# Patient Record
Sex: Male | Born: 1955
Health system: Southern US, Community
[De-identification: ages and names within clinical notes are randomized; demographics above are authoritative.]

## PROBLEM LIST (undated history)

## (undated) DIAGNOSIS — I4891 Unspecified atrial fibrillation: Secondary | ICD-10-CM

## (undated) DIAGNOSIS — I9789 Other postprocedural complications and disorders of the circulatory system, not elsewhere classified: Secondary | ICD-10-CM

## (undated) DIAGNOSIS — Z955 Presence of coronary angioplasty implant and graft: Secondary | ICD-10-CM

## (undated) DIAGNOSIS — Z95828 Presence of other vascular implants and grafts: Secondary | ICD-10-CM

## (undated) DIAGNOSIS — I10 Essential (primary) hypertension: Secondary | ICD-10-CM

## (undated) DIAGNOSIS — I2510 Atherosclerotic heart disease of native coronary artery without angina pectoris: Secondary | ICD-10-CM

## (undated) DIAGNOSIS — G4733 Obstructive sleep apnea (adult) (pediatric): Secondary | ICD-10-CM

## (undated) DIAGNOSIS — E785 Hyperlipidemia, unspecified: Secondary | ICD-10-CM

## (undated) DIAGNOSIS — E119 Type 2 diabetes mellitus without complications: Secondary | ICD-10-CM

## (undated) HISTORY — DX: Other postprocedural complications and disorders of the circulatory system, not elsewhere classified: I97.89

## (undated) HISTORY — DX: Type 2 diabetes mellitus without complications: E11.9

## (undated) HISTORY — DX: Presence of other vascular implants and grafts: Z95.828

## (undated) HISTORY — DX: Unspecified atrial fibrillation: I48.91

## (undated) HISTORY — DX: Presence of coronary angioplasty implant and graft: Z95.5

## (undated) HISTORY — DX: Essential (primary) hypertension: I10

## (undated) HISTORY — DX: Hyperlipidemia, unspecified: E78.5

## (undated) HISTORY — DX: Atherosclerotic heart disease of native coronary artery without angina pectoris: I25.10

---

## 2000-07-06 ENCOUNTER — Encounter: Payer: Self-pay | Admitting: Neurosurgery

## 2000-07-06 ENCOUNTER — Inpatient Hospital Stay (HOSPITAL_COMMUNITY): Admission: RE | Admit: 2000-07-06 | Discharge: 2000-07-07 | Payer: Self-pay | Admitting: Neurosurgery

## 2002-08-06 ENCOUNTER — Inpatient Hospital Stay (HOSPITAL_COMMUNITY): Admission: AD | Admit: 2002-08-06 | Discharge: 2002-08-08 | Payer: Self-pay | Admitting: Cardiology

## 2002-08-06 ENCOUNTER — Encounter: Payer: Self-pay | Admitting: *Deleted

## 2002-08-07 ENCOUNTER — Encounter: Payer: Self-pay | Admitting: Cardiology

## 2004-04-02 ENCOUNTER — Encounter: Payer: Self-pay | Admitting: Cardiology

## 2004-05-04 ENCOUNTER — Inpatient Hospital Stay (HOSPITAL_COMMUNITY): Admission: AD | Admit: 2004-05-04 | Discharge: 2004-05-09 | Payer: Self-pay | Admitting: Neurosurgery

## 2004-07-29 ENCOUNTER — Observation Stay (HOSPITAL_COMMUNITY): Admission: RE | Admit: 2004-07-29 | Discharge: 2004-07-30 | Payer: Self-pay | Admitting: Neurosurgery

## 2004-12-30 ENCOUNTER — Ambulatory Visit: Admission: RE | Admit: 2004-12-30 | Discharge: 2004-12-30 | Payer: Self-pay | Admitting: *Deleted

## 2005-01-15 ENCOUNTER — Ambulatory Visit (HOSPITAL_COMMUNITY): Admission: RE | Admit: 2005-01-15 | Discharge: 2005-01-15 | Payer: Self-pay | Admitting: *Deleted

## 2005-06-16 ENCOUNTER — Ambulatory Visit: Payer: Self-pay | Admitting: Cardiology

## 2005-06-17 ENCOUNTER — Ambulatory Visit: Payer: Self-pay | Admitting: Cardiology

## 2006-07-12 ENCOUNTER — Ambulatory Visit: Payer: Self-pay | Admitting: Cardiology

## 2006-09-06 ENCOUNTER — Encounter: Payer: Self-pay | Admitting: Cardiology

## 2006-09-26 ENCOUNTER — Encounter: Payer: Self-pay | Admitting: Cardiology

## 2007-01-12 ENCOUNTER — Ambulatory Visit: Payer: Self-pay | Admitting: Cardiology

## 2008-05-15 ENCOUNTER — Ambulatory Visit: Payer: Self-pay | Admitting: Cardiology

## 2008-06-18 ENCOUNTER — Encounter: Payer: Self-pay | Admitting: Cardiology

## 2010-01-21 ENCOUNTER — Encounter: Payer: Self-pay | Admitting: Cardiology

## 2010-01-22 ENCOUNTER — Ambulatory Visit: Payer: Self-pay | Admitting: Cardiology

## 2010-01-22 DIAGNOSIS — E785 Hyperlipidemia, unspecified: Secondary | ICD-10-CM | POA: Insufficient documentation

## 2010-01-22 DIAGNOSIS — E119 Type 2 diabetes mellitus without complications: Secondary | ICD-10-CM

## 2010-01-22 DIAGNOSIS — I10 Essential (primary) hypertension: Secondary | ICD-10-CM | POA: Insufficient documentation

## 2010-01-22 DIAGNOSIS — I2510 Atherosclerotic heart disease of native coronary artery without angina pectoris: Secondary | ICD-10-CM | POA: Insufficient documentation

## 2010-01-22 DIAGNOSIS — I739 Peripheral vascular disease, unspecified: Secondary | ICD-10-CM | POA: Insufficient documentation

## 2010-01-22 DIAGNOSIS — E1151 Type 2 diabetes mellitus with diabetic peripheral angiopathy without gangrene: Secondary | ICD-10-CM | POA: Insufficient documentation

## 2010-02-05 ENCOUNTER — Ambulatory Visit: Payer: Self-pay | Admitting: Cardiology

## 2010-02-05 ENCOUNTER — Encounter: Payer: Self-pay | Admitting: Cardiology

## 2010-02-17 ENCOUNTER — Telehealth (INDEPENDENT_AMBULATORY_CARE_PROVIDER_SITE_OTHER): Payer: Self-pay | Admitting: *Deleted

## 2010-02-23 ENCOUNTER — Encounter: Payer: Self-pay | Admitting: Cardiology

## 2010-02-27 ENCOUNTER — Telehealth (INDEPENDENT_AMBULATORY_CARE_PROVIDER_SITE_OTHER): Payer: Self-pay | Admitting: *Deleted

## 2010-03-05 ENCOUNTER — Encounter: Payer: Self-pay | Admitting: Cardiology

## 2010-04-09 ENCOUNTER — Telehealth (INDEPENDENT_AMBULATORY_CARE_PROVIDER_SITE_OTHER): Payer: Self-pay | Admitting: *Deleted

## 2010-04-17 ENCOUNTER — Encounter (INDEPENDENT_AMBULATORY_CARE_PROVIDER_SITE_OTHER): Payer: Self-pay | Admitting: *Deleted

## 2010-09-07 ENCOUNTER — Encounter: Payer: Self-pay | Admitting: Cardiology

## 2010-09-07 ENCOUNTER — Telehealth (INDEPENDENT_AMBULATORY_CARE_PROVIDER_SITE_OTHER): Payer: Self-pay | Admitting: *Deleted

## 2010-09-07 ENCOUNTER — Inpatient Hospital Stay (HOSPITAL_COMMUNITY): Admission: EM | Admit: 2010-09-07 | Discharge: 2010-09-08 | Payer: Self-pay | Admitting: Emergency Medicine

## 2010-09-07 ENCOUNTER — Ambulatory Visit: Payer: Self-pay | Admitting: Internal Medicine

## 2010-09-08 ENCOUNTER — Encounter: Payer: Self-pay | Admitting: Cardiology

## 2010-12-22 NOTE — Progress Notes (Signed)
Summary: Pending ABI's  Phone Note Outgoing Call Call back at [facility] Phone [phone]   Call placed by: Cyril Loosen, RN, BSN,  February 27, 2010 4:10 PM Call placed to: Patient Summary of Call: Left message to call back on machine regarding ABI's that were scheduled for April 7th. Initial call taken by: Cyril Loosen, RN, BSN,  February 27, 2010 4:11 PM  Follow-up for Phone Call        Left message to call back on machine regarding ABI's that were ordered for 4/7 but do not appear to have been done. Cyril Loosen, RN, BSN  March 03, 2010 10:38 AM     Additional Follow-up for Phone Call Additional follow up Details #2::    I have called myself and left message with patient. Also, mailed updated order to patient' s home for the order of 02/26/2010  Follow-up by: Zachary George,  March 04, 2010 10:18 AM

## 2010-12-22 NOTE — Miscellaneous (Signed)
Summary: Orders Update  Clinical Lists Changes  Orders: Added new Referral order of Arterial Duplex Lower Extremity/ABI (Arterial Dup Lower E) - Signed 

## 2010-12-22 NOTE — Progress Notes (Signed)
Summary: Chest pain  Phone Note Call from Patient Call back at Home Phone [phone]   Summary of Call: Pt states had been doing good until this past week. He states he started having the worst heartburn of his life. He states at first antacids would help some but now he only gets relief from NTG (x1). He states this is how he felt when he had his stent placed previously. Pt states last NTG was last night. No pain currently. However, he took NTG on 3 separate occasions this weekend. Pt wants to be seen in the office today.  Discussed with Dr. Antoine Poche who recommends pt go to ER now for evaluation. Pt notified and verbalized understanding. He will plan to go to [facility] for evaluation. Initial call taken by: Cyril Loosen, RN, BSN,  September 07, 2010 9:22 AM

## 2010-12-22 NOTE — Progress Notes (Signed)
Summary: need dopplers rescheduled  Phone Note Outgoing Call Call back at [facility] Phone [phone]   Call placed by: Carlye Grippe,  February 17, 2010 8:43 AM Call placed to: Patient Summary of Call: Called and spoke with wife and she said that the hospital didn't do the dopplers and they dont know why. This need to be rescheduled. Patient awaiting call from scheduler.   RESCHEDULED DOPPERS FOR THURSDAY, APRIL7 @ 9:00AM [facility]. WILL NOTIFY PATIENT. Initial call taken by: Carlye Grippe,  February 17, 2010 8:44 AM

## 2010-12-22 NOTE — Letter (Signed)
Summary: Engineer, materials at [facility]  [address]   [place], [place] [address]   Phone: [phone]  Fax: [phone]        Apr 17, 2010 MRN: [id number]   [facility] [address] [place], [place]  [address]   Dear Mr. Noguera,  Your test ordered by Selena Batten has been reviewed by your physician Dr. Antoine Poche, and was found to have very mild decrease in the blood flow of your right leg. You may be referred to Dr. Excell Seltzer in [place] [facility] specialist if you want to be evaluated for this problem.  ____ Cardiac Stress Test  ____ Lab Work  __X__ Peripheral vascular study of arms, legs or neck  ____ CT scan or X-ray  ____ Lung or Breathing test  ____ Other:   Thank you.   Carlye Grippe LPN    J. Hochrein, M.D., F.A.C.C.

## 2010-12-22 NOTE — Progress Notes (Signed)
Summary: DOPPLER RESULTS  Phone Note Outgoing Call Call back at [phone]   Call placed by: Carlye Grippe,  Apr 09, 2010 12:25 PM Call placed to: Patient Summary of Call: called and left message on voicemail r/e the okay to leave message on his voicemail or with wife the results of his vascular study. Initial call taken by: Carlye Grippe,  Apr 09, 2010 12:26 PM  Follow-up for Phone Call        after several attempt to reach patient by phone with no success, letter mailed to patient with results.  Follow-up by: Carlye Grippe,  Apr 17, 2010 1:35 PM

## 2010-12-22 NOTE — Assessment & Plan Note (Signed)
Summary: PER DR. HOWARD/ SAW PAT IN OFFICE 01/21/2010   Visit Type:  Follow-up Primary Provider:  Dr. Selinda Flavin  CC:  CAD.  History of Present Illness: The patient presents for evaluation of known coronary disease. He has not been seen in this office in 2 years. At the last visit he was to have a stress perfusion study but did not have this done. He has not been compliant with secondary risk reduction. He ran out of statins. He doesn't watch his diet. He doesn't exercise. His blood sugars have been out of control with by his report a hemoglobin A1c greater than 12. He's been getting some chest discomfort. This is sporadic. It is a burning discomfort. It is not like the pain he had in 2003 at the time of his heart attack. However, he has taken nitroglycerin a couple of times in the last year with the last episode about 6 months ago. He is not particularly active. He doesn't think however that he can bring on the symptoms with activity. He will get short of breath walking 50 yards. He does not describe resting shortness of breath, PND or orthopnea. Of note when he does get discomfort it goes away spontaneously for the most part. He does not describe associated symptoms such as nausea vomiting or diaphoresis. He does not describe radiation to his neck or arm.  Anticoagulation Management History:      Negative risk factors for bleeding include an age less than 40 years old.  The bleeding index is 'low risk'.  Positive CHADS2 values include History of HTN.  Negative CHADS2 values include Age > 42 years old.     Preventive Screening-Counseling & Management  Alcohol-Tobacco     Smoking Status: quit > 6 months     Year Quit: 2003  Comments: started smoking at 55 yrs old  Current Medications (verified): 1)  Lipitor 10 Mg Tabs (Atorvastatin Calcium) .... Take 1 Tablet By Mouth Once A Day 2)  Lisinopril-Hydrochlorothiazide 10-12.5 Mg Tabs (Lisinopril-Hydrochlorothiazide) .... Take 1 Tablet By Mouth  Once A Day 3)  Metoprolol Succinate 25 Mg Xr24h-Tab (Metoprolol Succinate) .... Take 1 Tablet By Mouth Once  A Day 4)  Fish Oil 1000 Mg Caps (Omega-3 Fatty Acids) .... Take 1 Capsule By Mouth Once A Day 5)  Aspirin 81 Mg Tbec (Aspirin) .... Take One Tablet By Mouth Daily 6)  Metformin Hcl 500 Mg Tabs (Metformin Hcl) .... Take 2 Tablets By Mouth Every Morning  Allergies (verified): No Known Drug Allergies  Comments:  Nurse/Medical Assistant: The patient's medications were reviewed with the patient and were updated in the Medication List. Pt verbally confirmed medications.  Cyril Loosen, RN, BSN (January 22, 2010 1:21 PM)  Past History:  Past Medical History: 1. Single-vessel coronary artery disease.     a.     Status post bare-metal stenting of distal right coronary      artery, September 2003.     b.     Nonischemic Cardiolite in May 2005.     c.     No-show for Cardiolite in 2009. 2. Peripheral vascular disease.     a.     Status post stenting of the left femoral system by Dr. Liliane Bade. 2006     b.     Residual right distal peripheral vascular disease with      minimal claudication. 3. Severe sleep apnea, using CPAP. 4. Morbid obesity. 5. Dyslipidemia. 6. Diabetes 6. Hypertension.  Past Surgical History: Back lumbar  Social History: Smoking Status:  quit > 6 months  Review of Systems       Legs cramps nocturnal, hand tingling.  Otherwise as stated in the history of present illness is negative for all other systems.  Vital Signs:  Patient profile:   55 year old male Height:      68 inches Weight:      286.75 pounds BMI:     43.76 Pulse rate:   76 / minute BP sitting:   127 / 81  (left arm) Cuff size:   large  Vitals Entered By: Cyril Loosen, RN, BSN (January 22, 2010 1:14 PM)  Nutrition Counseling: Patient's BMI is greater than 25 and therefore counseled on weight management options. CC: CAD Comments Pt has stent back '03 and no recent follow  up   Physical Exam  General:  Well developed, well nourished, in no acute distress. Head:  normocephalic and atraumatic Eyes:  PERRLA/EOM intact; conjunctiva and lids normal. Mouth:  Teeth, gums and palate normal. Oral mucosa normal. Neck:  Neck supple, no JVD. No masses, thyromegaly or abnormal cervical nodes. Chest Wall:  no deformities or breast masses noted Lungs:  Clear bilaterally to auscultation and percussion. Abdomen:  Bowel sounds positive; abdomen soft and non-tender without masses, organomegaly, or hernias noted. No hepatosplenomegaly, obese Msk:  Back normal, normal gait. Muscle strength and tone normal. Extremities:  No clubbing or cyanosis. Neurologic:  Alert and oriented x 3. Skin:  Intact without lesions or rashes. Cervical Nodes:  no significant adenopathy Axillary Nodes:  no significant adenopathy Inguinal Nodes:  no significant adenopathy Psych:  Normal affect.   Detailed Cardiovascular Exam  Neck    Carotids: Carotids full and equal bilaterally without bruits.      Neck Veins: Normal, no JVD.    Heart    Inspection: no deformities or lifts noted.      Palpation: normal PMI with no thrills palpable.      Auscultation: regular rate and rhythm, S1, S2 without murmurs, rubs, gallops, or clicks.    Vascular    Abdominal Aorta: no palpable masses, pulsations, or audible bruits.      Femoral Pulses: normal femoral pulses bilaterally.      Pedal Pulses: absent right dorsalis pedis pulse, absent right posterior tibial pulse, diminished left dorsalis pedis pulse, and diminished left posterior tibial pulse.      Radial Pulses: normal radial pulses bilaterally.      Peripheral Circulation: no clubbing, cyanosis, or edema noted with normal capillary refill.     EKG  Procedure date:  01/21/2010  Findings:      normal sinus rhythm, axis within normal limits, intervals within normal limits, no acute ST-T wave changes. Done at his primary care office and reviewed  today.  Impression & Recommendations:  Problem # 1:  CORONARY ATHEROSCLEROSIS NATIVE CORONARY ARTERY (ICD-414.01) The patient has recurrent chest pain as described and known coronary disease. Given this stress perfusion testing is indicated. He needs aggressive risk reduction. Orders: Nuclear Med (Nuc Med)  Problem # 2:  ESSENTIAL HYPERTENSION, BENIGN (ICD-401.1) His blood pressure is controlled and he will continue the meds as listed.  Problem # 3:  PERIPHERAL VASCULAR DISEASE (ICD-443.9) He does have some right leg pain and reduced pulses. He status post revascularization on the left leg. I will obtain ABIs.  Problem # 4:  OBESITY, MORBID (ICD-278.01) I spent the majority of this evaluation discussing with him therapeutic lifestyle changes  and specific details on exercise and weight loss as this is a very significant problem for him.  Other Orders: Arterial Duplex Lower Extremity/ABI (Arterial Dup Lower E)  Anticoagulation Management Assessment/Plan:            Patient Instructions: 1)  Exercise Cardiolite 2)  ABI's 3)  Follow up in  6 months  Appended Document: Orders Update     Allergies: No Known Drug Allergies

## 2010-12-22 NOTE — Cardiovascular Report (Signed)
Summary: Cardiac Catheterization  Cardiac Catheterization   Imported By: Dorise Hiss 01/21/2010 16:14:45  _____________________________________________________________________  External Attachment:    Type:   Image     Comment:   External Document

## 2011-02-03 LAB — CARDIAC PANEL(CRET KIN+CKTOT+MB+TROPI)
CK, MB: 1.5 ng/mL (ref 0.3–4.0)
Total CK: 57 U/L (ref 7–232)

## 2011-02-03 LAB — HEMOGLOBIN A1C: Mean Plasma Glucose: 163 mg/dL — ABNORMAL HIGH (ref <117)

## 2011-02-03 LAB — LIPID PANEL
Cholesterol: 132 mg/dL (ref 0–200)
LDL Cholesterol: 55 mg/dL (ref 0–99)
Triglycerides: 235 mg/dL — ABNORMAL HIGH (ref <150)

## 2011-02-03 LAB — CBC
HCT: 41.1 % (ref 39.0–52.0)
HCT: 43.5 % (ref 39.0–52.0)
MCH: 33.3 pg (ref 26.0–34.0)
MCH: 33.4 pg (ref 26.0–34.0)
MCV: 93.1 fL (ref 78.0–100.0)
MCV: 95.1 fL (ref 78.0–100.0)
Platelets: 136 10*3/uL — ABNORMAL LOW (ref 150–400)
Platelets: 153 10*3/uL (ref 150–400)
RBC: 4.67 MIL/uL (ref 4.22–5.81)
RDW: 12.3 % (ref 11.5–15.5)
WBC: 6 10*3/uL (ref 4.0–10.5)

## 2011-02-03 LAB — CK TOTAL AND CKMB (NOT AT ARMC)
CK, MB: 1.7 ng/mL (ref 0.3–4.0)
Relative Index: INVALID (ref 0.0–2.5)
Total CK: 58 U/L (ref 7–232)

## 2011-02-03 LAB — GLUCOSE, CAPILLARY: Glucose-Capillary: 119 mg/dL — ABNORMAL HIGH (ref 70–99)

## 2011-02-03 LAB — POCT CARDIAC MARKERS: Myoglobin, poc: 39.6 ng/mL (ref 12–200)

## 2011-02-03 LAB — POCT I-STAT, CHEM 8
BUN: 15 mg/dL (ref 6–23)
Chloride: 103 mEq/L (ref 96–112)
Creatinine, Ser: 0.9 mg/dL (ref 0.4–1.5)
Glucose, Bld: 157 mg/dL — ABNORMAL HIGH (ref 70–99)
Hemoglobin: 16.3 g/dL (ref 13.0–17.0)
TCO2: 26 mmol/L (ref 0–100)

## 2011-02-03 LAB — BASIC METABOLIC PANEL
Calcium: 8.9 mg/dL (ref 8.4–10.5)
Creatinine, Ser: 0.9 mg/dL (ref 0.4–1.5)
GFR calc non Af Amer: >60 mL/min (ref >60)
Glucose, Bld: 153 mg/dL — ABNORMAL HIGH (ref 70–99)
Potassium: 3.9 mEq/L (ref 3.5–5.1)
Sodium: 138 mEq/L (ref 135–145)

## 2011-02-03 LAB — HEPATIC FUNCTION PANEL
ALT: 30 U/L (ref 0–53)
AST: 21 U/L (ref 0–37)
Alkaline Phosphatase: 46 U/L (ref 39–117)
Total Bilirubin: 0.8 mg/dL (ref 0.3–1.2)

## 2011-04-06 NOTE — Assessment & Plan Note (Signed)
[facility]                          [facility] OFFICE NOTE   [name] Gray, [name] Gray [name] Gray                      MRN:          [id number]  DATE:05/15/2008                            DOB:          February 25, 1956    REFERRING PHYSICIAN:  Selinda Flavin   The patient is a very pleasant 55 year old male with a history of single-  vessel coronary artery disease with bare-metal stenting to distal right  coronary artery in September 2003.  The patient was seen in the office  earlier this year and a stress test was scheduled; however, the patient  was unable to do this due to his job situation.  He denies, however, any  substernal chest pain.  He has no shortness of breath, orthopnea, or  PND.  He does appear to have minimal claudication with pain in the calf  in the right leg after approximately one mile of walking.  The patient  states that during his physical exam it was noted that his blood  pressure was elevated.  We also noticed this during the last office  visit and recommended lisinopril and hydrochlorothiazide.  The patient,  however, did not further take this prescription.  From a cardiovascular  perspective, however, in the office he appears to be doing well.   MEDICATIONS:  1. Toprol XL 25 mg p.o. daily.  2. Protonix 40 mg p.o. daily.  3. Fish oil.  4. Lipitor 10 mg p.o. nightly.  5. Aspirin 81 mg p.o. daily.   PHYSICAL EXAMINATION:  VITAL SIGNS:  Blood pressure 145/99, heart rate  is 61 beats per minute, weight is 306 pounds.  NECK:  Normal carotid upstroke.  No carotid bruits.  LUNGS:  Clear breath sounds bilaterally.  HEART:  Regular rate and rhythm.  Normal S1 and S2.  No murmurs, rubs,  or gallops.  ABDOMEN:  Soft and nontender.  No rebound or guarding.  Good bowel  sounds.  EXTREMITIES:  No cyanosis, clubbing, or edema.  NEURO:  Patient alert and oriented.  Grossly nonfocal.   PROBLEM LIST:  1. Single-vessel coronary artery disease.  a.     Status post bare-metal stenting of distal right coronary       artery, September 2003.      b.     Nonischemic Cardiolite in May 2005.      c.     No-show for Cardiolite in 2009.  2. Peripheral vascular disease.      a.     Status post stenting of the left femoral system by Dr. Liliane Bade.      b.     Residual right distal peripheral vascular disease with       minimal claudication.  3. Severe sleep apnea, using CPAP.  4. Morbid obesity.  5. Dyslipidemia.  6. Hypertension.   PLAN:  1. I have asked the patient to follow up with Dr. Dimas Aguas regarding his      dyslipidemia management.  2. If the patient's blood pressure remains uncontrolled then we will      restart lisinopril and hydrochlorothiazide.  3. I have also requested the patient to proceed with a Cardiolite      study, but he states that he will get back to [place] and schedule the      study electively.     Learta Codding, MD,FACC  Electronically Signed    GED/MedQ  DD: 05/15/2008  DT: 05/16/2008  Job #: [id number]   cc:   Selinda Flavin

## 2011-04-09 NOTE — Cardiovascular Report (Signed)
NAME:  [name] [name], [name] [name] NO.:  [id number]   MEDICAL RECORD NO.:  [id number]                   PATIENT TYPE:  INP   LOCATION:  6523                                 FACILITY:  [facility]   PHYSICIAN:  Jonelle Sidle, M.D. [facility]        DATE OF BIRTH:  05-Feb-1956   DATE OF PROCEDURE:  DATE OF DISCHARGE:                              CARDIAC CATHETERIZATION   PRIMARY CARE PHYSICIAN:  Selinda Flavin, M.D.   CARDIOLOGIST:  Learta Codding, M.D.   INDICATIONS:  .  The patient is a 55 year old male with a history of tobacco use,  hypertension and likely the metabolic syndrome, who presents with symptoms  suggestive of unstable angina. He has ruled out for myocardial infarction  and is referred for cardiac catheterization to define the coronary anatomy  and assess for revascularization options.   PROCEDURES PERFORMED:  1. Left heart catheterization.  2. Selective coronary angiography.  3. Left ventriculography.   ACCESS AND EQUIPMENT:  The area about the right femoral artery was  anesthetized with 1% lidocaine and a 6 French sheath was placed in the right  femoral artery via the modified Seldinger technique. Standard preformed 6  [place] and JR4 catheters were used for selective coronary angiography,  and a 6 French angled pigtail catheter was used for left heart  catheterization and left ventriculography.  All exchanges were made over a  wire and the patient tolerated the procedure well without immediate  complications.   HEMODYNAMICS:  1. Left ventricle 137/15 mmHg.  2. Aorta 137/84 mmHg.   ANGIOGRAPHIC FINDINGS:  1. Left main coronary artery has 20% distal tapering.  2. Left anterior descending is a medium caliber vessel that provides three     diagonal branches.  There is 20% proximal to mid left anterior descending     stenosis without flow-limiting lesions.  3. The circumflex coronary artery is small and has minor luminal     irregularities. There  is a large ramus intermedius branch that also has     minor luminal irregularities.  4. The right coronary artery is dominant and diffusely diseased with a 50%     proximal stenosis, 30% mid vessel stenosis, 60% distal stenosis, and a     focal 95% distal stenosis that is likely the culprit lesion.   LEFT VENTRICULOGRAPHY:  Left ventriculography was performed in the RAO  projection reveals an ejection fraction of approximately 55% with no  significant mitral regurgitation.   DIAGNOSES:  1. Single-vessel obstruction coronary artery disease with a 95% distal right     coronary artery stenosis.  2. Left ventricular ejection fraction of approximately 55%.   RECOMMENDATIONS:  I have discussed the films with Dr. Chales Abrahams.  Will we plan  to proceed with a percutaneous intervention to address the distal RCA  stenosis. Otherwise, the patient will need aggressive risk factor  modification.  Jonelle Sidle, M.D. [facility]    SGM/MEDQ  D:  08/07/2002  T:  08/07/2002  Job:  [id number]   cc:   Selinda Flavin  [address]  [place]  [place] [address]  Fax: [phone]   Learta Codding, M.D. [facility]

## 2011-04-09 NOTE — Discharge Summary (Signed)
NAME:  [name] [name], [name] [name] NO.:  [id number]   MEDICAL RECORD NO.:  [id number]                   PATIENT TYPE:  INP   LOCATION:  3018                                 FACILITY:  [facility]   PHYSICIAN:  Clydene Fake, M.D.               DATE OF BIRTH:  25-May-1956   DATE OF ADMISSION:  05/04/2004  DATE OF DISCHARGE:  05/09/2004                                 DISCHARGE SUMMARY   DIAGNOSES:  Recurrent left L4-5 herniated nucleus pulposus.   DISCHARGE DIAGNOSES:  Recurrent left L4-5 herniated nucleus pulposus.   PROCEDURES:  Left L4-5 lumbar diskectomy.   REASON FOR ADMISSION:  The patient is a 55 year old gentleman who had left  L4-5 diskectomy in August 2001.  Over the last year or so, he started having  increasing difficulties with back and left leg pain.  At that time, MRI was  done showing spinal changes with large left recurrent disk herniation.  The  patient brought in for decompression.   HOSPITAL COURSE:  The patient was admitted the day of surgery and underwent  procedure above without complications.  Postop, the patient was sent to the  recovery room down to the floor.  He was set with a lumbar corset that he  was to wear when he was up.  He had much less decreased leg pain noted right  away.  He did have some numbness in the left lower extremity.  His progress  the next day was slow.  He was not ambulating all that much.  By July 15, he  was having some nausea and vomiting, worse with activity.  GU consult was  obtained being that Foley was tried to be repositioned.  It was replaced by  the nurse as I had some difficulty to get Foley placed, and he got good  urine return.  Started him on Flomax.  __________ with the Foley drainage  for a couple of days.  Started to get up out of bed.  Started feeling a bit  better, but still was having headaches, pretty sure and constant, all the  way up to the 17th.  __________ were checked on the 17th, even though  we  kept the patient flat.  Percocet was __________ and headaches resolved by  the next day.  Again, he was up out of bed on June 08, 2004, doing well with  no headache.  He had positive flatus, but no bowel movement.  The patient  was doing well.  He had Foley removed, was up ambulating during the day and  was urinating well and was discharged home on June 08, 2004, in stable  condition.   DISCHARGE INSTRUCTIONS:  Activity:  No strenuous activity.  Keep incision  dry.  Diet:  As tolerated.   FOLLOWUP:  Follow up with Dr. Newell Coral in the next 2-3 weeks.  Clydene Fake, M.D.    JRH/MEDQ  D:  06/18/2004  T:  06/18/2004  Job:  [id number]

## 2011-04-09 NOTE — Assessment & Plan Note (Signed)
[facility]                          [facility] OFFICE NOTE   JASMIN, TRUMBULL                      MRN:          [id number]  DATE:01/12/2007                            DOB:          Oct 30, 1956    PRIMARY CARDIOLOGIST:  Dr. Vernie Shanks. [place].   REASON FOR VISIT:  Scheduled 71-month follow-up.   Since last seen here in the clinic in August, 2007, by Dr. Andee Lineman, the  patient continues to report no interim development of signs or symptoms  suggestive of unstable angina pectoris. He does, however, have chronic  intermittent claudication of the right lower extremity but has had no  further LLE claudication since undergoing stenting of the left iliac  artery, by Dr. Liliane Bade. His last set of lower extremity arterial  Doppler's suggested 0.77 right; 0.79 left in May of 2005.   When last seen in the clinic, recommendation was to proceed with an  exercise stress-test for follow-up of known CAD with previous stenting  in 2003. However, due to scheduling conflicts this was not arranged.   The patient has since been diagnosed with severe obstructive sleep apnea  with recommendation to be treated with CPAP.  However, he indicates  today that he is not able to tolerate this and has only used it on a few  occasions. He also did not tolerate the Requip which Dr. Andee Lineman put him  on for treatment of restless leg syndrome.   CURRENT MEDICATIONS:  1. Toprol XL 25 daily.  2. Aspirin 81 daily.  3. Lipitor 10 daily.  4. Fish oil daily.  5. Protonix.   PHYSICAL EXAMINATION:  VITAL SIGNS: Blood pressure 149/99, pulse 77,  regular. Weight 300.8 (down 3 pounds).  GENERAL: [age]-year-old male, morbidly obese, sitting upright in  no distress.  HEENT: Normocephalic, atraumatic.  NECK: Palpable bilateral carotid pulses without bruits; unable to assess  JVD secondary to neck girth.  LUNGS: Clear to auscultation in all fields.  HEART: Regular rate and rhythm  S1, S2. No significant murmurs. No rubs  or gallops.  ABDOMEN: Protuberant, nontender.  EXTREMITIES: No significant edema.  NEURO: No focal deficit.   IMPRESSION:  1. Single vessel coronary artery disease.      a.     Status post bare metal stenting, high grade distal right       coronary artery, September, 2003.      b.     Non ischemic Adenosine stress Cardiolite, May, 2005.  2. Peripheral vascular disease.      a.     Status post stenting of left iliofemoral system by Dr. Liliane Bade.  3. Severe sleep apnea.      a.     CPAP intolerant.  4. Morbid obesity.  5. Hyperlipidemia.  6. Hypertension.   PLAN:  1. Reschedule stress-test as an exercise stress Cardiolite for risk      stratification.  2. Check fasting lipids/liver profile at that time. We will also check      a hemoglobin A1C.  3. Start Lotensin HCT 10/12.5 daily for better blood  pressure control.  4. Check follow-up basic metabolic panel in 1 week.  5. Schedule return clinic follow-up with myself and Dr. Andee Lineman in 4-6      weeks.      Gene Serpe, PA-C  Electronically Signed      Learta Codding, MD,FACC  Electronically Signed   GS/MedQ  DD: 01/12/2007  DT: 01/12/2007  Job #: [id number]   cc:   Selinda Flavin

## 2011-04-09 NOTE — Op Note (Signed)
[name]  Patient:    [name] [name], [name] [name]                        MRN: [id number] Proc. Date: 07/06/00 Adm. Date:  [id number] Attending:  Barton Fanny                           Operative Report  PREOPERATIVE DIAGNOSIS:  L4-5 disk herniation.  POSTOPERATIVE DIAGNOSIS:  L4-5 disk herniation.  PROCEDURE:  Left L4-5 lumbar laminotomy and right diskectomy with microdissection.  SURGEON:  Hewitt Shorts, M.D.  ASSISTANT:  Payton Doughty, M.D.  ANESTHESIA:  General endotracheal.  INDICATION:  The patient is a 55 year old man who presented with left lumbar radiculopathy that was found to be due to a large L4-5 disk herniation which extended bilaterally.  Decision was made to proceed with elective laminotomy and microdiskectomy.  PROCEDURE IN DETAIL:  The patient was brought to the operating room, placed under general endotracheal anesthesia.  The patient was turned to a prone position.  The lumbar region was prepped with Betadine scrub and solution, draped in a sterile fashion.  The midline was injected with local anesthetic with epinephrine.  An X-ray was taken.  An incision was made over the L4-5 level and carried down to subcutaneous tissue.  Bipolar cautery electrocautery was used to maintain hemostasis.  Dissection was carried down to the lumbar fascia which was incised in the left side of the midline and the paraspinal muscles were dissected from the spinous processes and lamina in subperiosteal fashion.  The L4-5 interlaminar space was identified and X-ray taken to confirm the localization.  Then, a laminotomy was performed using Midas Rex drill and AMA bur and Kerrison punch.  The ligamentum flavum was removed and the microscope was draped and brought on the field, and the remainder of the procedure was performed using microdissection technique.  The ligamentum flavum was removed and the thecal sac identified.  The thecal sac and left  L5 nerve root were gently retracted medially exposing large subligamentous disk herniation.  The remaining annular fibers were incised and disk space entered and extensive amounts of degenerated disk material were removed.  We also were able to mobilize several fragments of disk that had herniated into the ligament tissue and were able to achieve good decompression of the thecal sac and nerve roots.  We had anticipated the possibility of needing bilateral exposure.  However, it was felt that we obtained sufficient diskectomy and excellent decompression from just the left-sided approach and the right side did not need to be exposed.  In the end, all loose fragments of disk material were removed from both the disk space and the epidural space and good decompression of the thecal sac and nerve roots achieved.  We then established hemostasis with the use of Gelfoam soaked in thrombin and bipolar cautery.  All the Gelfoam was removed prior to closure.  Prior to closure we did instill 2 cc of Fentanyl and 80 mg of Depo-Medrol and then we proceeded with closure.  The deep fascia was closed with interrupted undyed 0 Vicryl suture and the subcutaneous with subcuticular closure with inverted 2-0 Vicryl suture and the skin edges were approximated with Dermabond.  The patient tolerated the procedure well.  The estimated blood loss was 50 cc. Sponge and needle counts were correct.  Following surgery, the patient is to be turned back to supine position,  reversed from anesthetic and transferred to the recovery room for further care. DD:  07/06/00 TD:  07/06/00 Job: [id number]

## 2011-04-09 NOTE — Discharge Summary (Signed)
NAME:  [name] [name], [name] [name] NO.:  [id number]   MEDICAL RECORD NO.:  [id number]                   PATIENT TYPE:  INP   LOCATION:  6523                                 FACILITY:  [facility]   PHYSICIAN:  Rollene Rotunda, MD [facility]              DATE OF BIRTH:  10-06-1956   DATE OF ADMISSION:  08/06/2002  DATE OF DISCHARGE:  08/08/2002                           DISCHARGE SUMMARY - REFERRING   DISCHARGE DIAGNOSES:  1. Coronary artery disease status post stent to the distal right coronary     artery.  2. Metabolic syndrome, treated.  3. Gastroesophageal reflux disease, treated.  4. Hypertension, treated and controlled.  5. Suspected sleep apnea.  The patient has been tentatively set up for a     sleep study with Dr. Egbert Garibaldi.  6. Tobacco abuse.   HOSPITAL COURSE:  The patient is a 55 year old male patient with a know  history of tobacco abuse, hypertension, and hypercholesterolemia, who was  admitted to the hospital on 08/06/2002 with substernal chest pain and left  arm pain.  He was seen in [place] approximately one week prior to this  admission with typical chest pain symptoms but refused admission at that  time.  On the morning of admission, he had severe substernal chest pain with  radiation to the jaw associated with diaphoresis and shortness of breath.  The patient was initially seen at [facility] and then transferred  down for cardiac catheterization.  He underwent cardiac catheterization on  08/07/2002 under the care of Dr. Simona Huh.   The patient as found to have 20% distal left main stenosis,  20% proximal  and mid LAD, ramus with mild irregularities, circumflex small with mild  irregularities, right coronary artery dominant with a 50% proximal, 60%  distal, and 95% further distal stenosis.  There was no MR noted on left  ventriculogram with an EF of 55%.  At this point, Dr. Chales Abrahams intervened on  the distal right coronary artery lesion with reduction  of 95% lesion to a 0%  post stent stenosis.  The patient tolerated the procedure well and was  planned for discharge on 08/08/2002.  Laboratory data on this day showed  hemoglobin 15, hematocrit 43, platelets 178, white count 8.7.  BUN 15,  creatinine 1.1, sodium138, potassium 4.1.  Total cholesterol was 195 with  triglycerides 185, HDL 35, LDL 123.  Of note, his cardiac enzymes remained  negative during hospitalization.   Upon discharge, the patient's vital signs were stable with a blood pressure  of 120/70, and he was felt stable for discharge to home.   DISCHARGE MEDICATIONS:  1. Plavix 75 mg 1 p.o. q.d. for 9 months.  2. Toprol XL 25 mg a day.  3. Enteric-coated aspirin 325 mg a day for 1 month, then decrease to 81 mg a     day.  4. Zocor 40 mg 1 p.o. q.h.s.  5. Sublingual  nitroglycerin p.r.n. chest pain.  6. Protonix 40 mg 1 p.o. q.d.  7. __________ 500 mg p.o. b.i.d.   ACTIVITY:  The patient is to gradually increase activity over two days and  resume normal activity.   DIET:  Remain on low-fat diet.   WOUND CARE:  Clean catheterization site with soap and water.   FOLLOW UP:  The patient has an appointment with Dr. Andee Lineman on 08/16/2002 at  2:15 p.m.  At the request of Dr. Andee Lineman, I have arranged for the patient to  have an outpatient sleep study for possible sleep apnea with Dr. Egbert Garibaldi.  Their phone number is [phone].  Unfortunately, they were not able to give  me a specific time for this appointment; however, they will be calling the  patient within 30 days to set this study up.       Guy Franco, P.A. [facility]                      Rollene Rotunda, MD [facility]    LB/MEDQ  D:  08/08/2002  T:  08/08/2002  Job:  [id number]   cc:   [facility]  [address]   Arthor Captain, M.D.  [address]  [place], [place] [address]   Selinda Flavin, M.D.

## 2011-04-09 NOTE — Op Note (Signed)
NAME:  [name] [name], [name] [name]               ACCOUNT NO.:  [id number]   MEDICAL RECORD NO.:  [id number]          PATIENT TYPE:  OIB   LOCATION:  2899                         FACILITY:  [facility]   PHYSICIAN:  Balinda Quails, M.D.    DATE OF BIRTH:  1956/09/30   DATE OF PROCEDURE:  01/15/2005  DATE OF DISCHARGE:                                 OPERATIVE REPORT   PHYSICIAN:  Denman George, M.D.   DIAGNOSIS:  Bilateral lower extremity claudication.   PROCEDURES:  1.  Abdominal aortogram with bilateral lower extremity runoff arteriography.  2.  Left common iliac artery percutaneous transluminal angioplasty and      stenting with 10 x 40 mm Smart stent.  3.  Pressure pressure gradient measurement, right common iliac artery, with      nitroglycerin injection.   ACCESS:  Right common femoral 6 French sheath.   COMPLICATIONS:  None apparent.   CONTRAST:  210 mL Visipaque.   CLINICAL NOTE:  Mr. [name] [name] is a 55 year old male with history of  lumbar degenerative disk disease.  He is been previously operated on by Dr.  Newell Coral.  He has symptoms very suspicious for lower extremity vascular  claudication.  He underwent Doppler evaluation with exercise, revealing  pressure drops in both legs.  Findings were consistent with iliac occlusive  disease.  He is brought to the catheterization lab this time for elective  diagnostic arteriography and possible intervention.   PROCEDURE NOTE:  The patient brought to the catheterization lab in stable  condition.  Placed in supine position.  Both groins prepped and draped in a  sterile fashion.  Administered 50 mcg of fentanyl intravenously.  The skin and subcutaneous tissue in the right groin instilled with 1%  Xylocaine.  A needle was easily introduced in the right common femoral  artery.  A 0.035 J-wire passed through the needle into the right common  iliac artery.  The guidewire, however, could not initially be passed up into  the abdominal aorta.  A  short 5-French sheath was advanced over the guidewire.  Retrograde  injection made through the right femoral sheath.  This did reveal patency of  the right common iliac system.  The guidewire was then drawn back and with a  torque device, the guidewire was passed into the abdominal aorta without  further difficulty.   A standard pigtail catheter was then advanced over the guidewire to the  midabdominal aorta.  Standard AP midabdominal aortogram obtained.  This  revealed widely patent single left renal artery.  Two right renal arteries  present, the superior one was the main renal artery.  No significant right  renal artery stenosis identified.  The infrarenal aorta revealed mild  tapering at its termination.  There was no major aortic stenosis.  The  inferior mesenteric artery was widely patent.  The pigtail catheter then  brought down to the pelvis and AP and bilateral oblique pelvic arteriograms  were obtained.  This revealed an irregular plaque at the junction of the  left common and external iliac arteries.  There was occlusion of the left  hypogastric artery.  The left terminal common iliac plaque revealed  approximately an 80% stenosis.  The right common iliac artery appeared to  have an irregular stenosis in its midportion.  The right hypogastric artery  was widely patent.  The external iliac arteries were widely patent  bilaterally.  Runoff arteriography was then obtained.  Runoff revealed the left leg to  have widely patent common femoral and profunda femoris and superficial  femoral arteries.  The left the left popliteal artery was widely patent.  Tibial runoff was intact and left lower extremity with the anterior tibial,  posterior tibial and peroneal vessels patent.   Right lower extremity runoff revealed the common femoral artery to be widely  patent.  The right profunda femoris artery was normal.  The right  superficial femoral artery revealed an occlusion at the level of  the  proximal third of the thigh.  Extensive collaterals were noted with  reconstitution of the distal superficial femoral-popliteal junction at the  adductor canal.  The remainder of the right popliteal artery was widely  patent.  Intact right tibial artery three-vessel runoff was present.   The pigtail catheter was then brought down to the aortic bifurcation.  It  was hooked on the bifurcation and the Wholey guidewire advanced down into  the left iliac system, as far as left common femoral artery.  The short  sheath was removed from the right groin and a 6-French long Terumo sheath  was advanced over the guidewire into the left common iliac artery.  In the  LAO projection, left iliac arteriography was obtained.  This did verify a  complex plaque at the of the left common iliac artery with occlusion of the  left hypogastric artery.   A predilatation of the left common iliac stenosis was carried out with a  Powerflex 8 x 4 balloon.  This was inflated at 8 atmospheres times 30 seconds.  Upon completion of  this, the arteriogram did reveal relief of the stenosis.  However, there was  area of dissection within the angioplasty site.  This was felt best treated  by stenting to improve technical results.  A 10 x 40 Smart stent was then  deployed to the left common iliac artery.  A completion arteriogram was  obtained.  This revealed excellent position of the stent with no residual  stenosis.   Following this, the sheath was then drawn back into the right common iliac  artery and the guidewire brought back into the right common iliac artery.  A  pressure gradient was then measured across the right common iliac system  into the right external iliac system.  There was no significant pressure  drop.  The sheath was then re-advanced and injection of 200 mcg of  nitroglycerin carried out.  A repeat pressure gradient measurement was carried out.  This again revealed no drop across the right common  iliac to external iliac artery.   This completed the arteriogram procedure.  Of note, the patient did receive  5000 units of heparin intravenously prior to intervention of left common  iliac artery.   The patient transferred to the holding area stable condition.  No apparent  complications.   FINAL IMPRESSION:  1.  Widely patent single left renal artery, widely patent bifid right renal      artery system.  2.  Minimal infrarenal aortic occlusive disease.  3.  High-grade stenosis in the distal left common iliac artery, successfully      treated with  angioplasty and stenting.  4.  Right superficial femoral artery occlusion.   DISPOSITION:  These results been reviewed with the patient and family.  The  patient will be discharged from hospital today.  There were no apparent  complications.  He will return to the office in approximately a month and  discussion carried out regarding further treatment of his right superficial  femoral artery occlusive disease.      PGH/MEDQ  D:  01/15/2005  T:  01/15/2005  Job:  [id number]   cc:   Hewitt Shorts, M.D.  [address]  [place]  [place] [address]  Fax: [phone]   Selinda Flavin  [address]  [place]  [place] [address]  Fax: [phone]   [facility]

## 2011-04-09 NOTE — Op Note (Signed)
NAME:  [name] [name], [name] [name]                           ACCOUNT NO.:  [id number]   MEDICAL RECORD NO.:  [id number]                   PATIENT TYPE:  OIB   LOCATION:  2899                                 FACILITY:  [facility]   PHYSICIAN:  Hewitt Shorts, M.D.            DATE OF BIRTH:  [birth year]   DATE OF PROCEDURE:  05/04/2004  DATE OF DISCHARGE:                                 OPERATIVE REPORT   PREOPERATIVE DIAGNOSIS:  Recurrent left L4-L5 lumbar disc herniation, lumbar  spondylosis, lumbar degenerative disc disease, and lumbar radiculopathy.   POSTOPERATIVE DIAGNOSIS:  Recurrent left L4-L5 lumbar disc herniation,  lumbar spondylosis, lumbar degenerative disc disease, and lumbar  radiculopathy.   PROCEDURE:  Left L4-L5 lumbar laminotomy and microdiscectomy with  microdissection.   SURGEON:  Hewitt Shorts, M.D.   ASSISTANT:  Cristi Loron, M.D.   ANESTHESIA:  General endotracheal anesthesia.   INDICATIONS FOR PROCEDURE:  The patient is a 55 year old man who presented  with an acute left lumbar radiculopathy and was found to have a very large  recurrent left L4-L5 lumbar disc herniation.  The decision was made to  proceed with elective laminotomy and microdiscectomy.   DESCRIPTION OF PROCEDURE:  The patient was brought to the operating room and  placed under general endotracheal anesthesia.  The patient was turned to the  prone position and the lumbar region was prepped with DuraPrep and draped in  a sterile fashion.  The previous midline incision was infiltrated with local  anesthetic with epinephrine.  The previous midline incision was reopened and  dissection was carried down to the subcutaneous tissue.  Bipolar cautery and  electrocautery were used to maintain hemostasis.  Dissection was carried  down to the lumbar fascia which was incised to the left of the midline.  The  paraspinal muscles were dissected from the spinous process and lamina in a  subperiosteal fashion.   The previous L4-L5 laminotomy site was identified  and x-ray was taken to confirm the localization.  We then draped the  operating microscope and it was brought onto the field to provide  magnification, illumination, and visualization, and the remainder of the  decompression was performed using microdissection and microsurgical  technique.  The scar tissue adherent to the edge of the previous laminotomy  was dissected free from it and the laminotomy was extended rostrally until  we were able to identify a more normal appearing dura.  We then carried the  dissection to the epidural space caudally.  There was a good bit of epidural  scarring around the lateral aspect of the thecal sac extending ventrally.  We were able to identify the disc space.  We were able to enter into the  disc space and proceed with a discectomy.  We were then able to mobilize  fragments that were located anteromedially.  Several large fragments were  removed.  We continued to remove spondylitic overgrowth in  the posterior  aspect of both L4 and L5 as well as further disc fragments that had  herniated into the spinal canal.  As the dissection was performed in the  epidural space, a small 1 mm diameter hole occurred in the dura.  This was  sutured with a 6-0 Prolene.  We then continued the discectomy.  All loose  fragments of disc material were removed from both the disc space and the  epidural space and good decompression of the thecal sac and left L5 nerve  root was achieved.  Once the discectomy was completed, we established  hemostasis with the use of bipolar cautery as well as Gelfoam soaked in  thrombin.  However, all the Gelfoam was removed prior to closure.  We then  sealed over the dural repair with BioGlue and proceeded with closure.  The  deep fascia was closed with interrupted undyed #1 Vicryl suture, the  subcutaneous and subcuticular layer was closed with interrupted inverted 2-0  and 3-0 Vicryl sutures,  and the skin was reapproximated with Dermabond.  The  patient tolerated the procedure well.  Estimated blood loss was 200 mL.  Sponge and needle counts were correct.  During the procedure, we did  irrigate the wound with Bacitracin solution.  Following surgery, the patient  was turned back to the supine position, reversed from the anesthetic,  extubated, and transferred to the recovery room for further care where he  was noted to be moving all four extremities to command.                                               Hewitt Shorts, M.D.    RWN/MEDQ  D:  05/04/2004  T:  05/04/2004  Job:  [id number]

## 2011-04-09 NOTE — Cardiovascular Report (Signed)
NAME:  [name] [name], [name] [name]                           ACCOUNT NO.:  [id number]   MEDICAL RECORD NO.:  [id number]                   PATIENT TYPE:  INP   LOCATION:  6523                                 FACILITY:  [facility]   PHYSICIAN:  Veneda Melter, M.D. [facility]               DATE OF BIRTH:  1956-01-18   DATE OF PROCEDURE:  08/07/2002  DATE OF DISCHARGE:                              CARDIAC CATHETERIZATION   PROCEDURE PERFORMED:  Percutaneous transluminal coronary angioplasty and  stent placement in the right coronary artery.   DIAGNOSIS:  1. Severe single-vessel coronary artery disease.  2. Unstable angina.   HISTORY:  The patient is a 55 year old white male with multiple  cardiovascular risk factors who presents with unstable angina. The patient  was admitted to the hospital and underwent cardiac catheterization by Dr.  Diona Browner earlier today showing severe single-vessel coronary artery disease  involving the distal right coronary artery with overall well preserved LV  function.  He is referred for percutaneous intervention.   TECHNIQUE:  Informed consent was obtained, existing 6 French sheath in the  right groin was utilized.  The patient had been pretreated with Plavix and  was given heparin on a weight-adjusted basis to maintain an ACT of  approximately 30 seconds.  A 6 [place] guide catheter was then used to  engage the right coronary artery and selective guide shots obtained. This  confirmed the presence of moderate disease of 50%  in the mid section of the  vessel.  There was then a long segment of disease in the distal section  after an RV marginal branch of 60-70% culminating grade 95% narrowing prior  to the takeoff of the posterior descending artery.  The distal vessels had  mild irregularities.  A 0.014 inch ATW marker wire was advanced into the  distal section of the vessel, and a 2.5 x 15 mm Maverick balloon was used to  pre-dilate the lesion at 6 atmospheres for 30 seconds.   This showed the  vessel to be of  large caliber and the lesion length to be quite  substantial. Repeat angiography after pre-dilatation showed severe residual  narrowing of at least 70%.  A 3.5 x 28 mm Zeta stent was then introduced,  carefully positioned under cineangiography in the distal right coronary  artery extending up to the takeoff of the posterior descending artery and  deployed at 12 atmospheres for 60 seconds.  There was mild residual waste in  the proximal mid section of the stent and a 4.0 x 20 mm Quantum Maverick  balloon was introduced and used to post-dilate the distal section at 14  atmospheres for 30 seconds in the proximal segment at 16 atmospheres for 30  seconds.  Repeat angiography was then performed after the administration of  intracoronary nitroglycerin showing excellent result but no residual  stenosis, full coverage of the lesion and no evidence of distal vessel  damage.  Final angiography was performed in various projections confirming  these findings. The guide catheter was then removed, the sheath secured in  position. The patient tolerated the procedure well.    FINAL RESULTS:  Successful percutaneous transluminal coronary angioplasty  and stent placement in the right coronary artery with reduction of 95%  narrowing to 0% with placement of a 3.5 x 28 mm Zeta stent dilated to 4 mm.                                                  Veneda Melter, M.D. [facility]    NG/MEDQ  D:  08/07/2002  T:  08/07/2002  Job:  [id number]   cc:   Learta Codding, M.D. [facility]   Selinda Flavin  [address]  [place]  [place] [address]  Fax: [phone]

## 2011-04-09 NOTE — Assessment & Plan Note (Signed)
[facility]                            [facility] OFFICE NOTE   [name] [name], [name] [name]                      MRN:          [id number]  DATE:07/12/2006                            DOB:          February 28, 1956    REFERRING PHYSICIAN:  Dr. Selinda Flavin.   HISTORY OF PRESENT ILLNESS:  The patient is a 55 year old male with a  history of single vessel coronary artery disease with stent on the right  coronary artery in September of 2003.  The patient also has known peripheral  vascular disease and has right leg claudication.  He is status post  descending of the left ilio-femoral artery.  The patient has been doing well  from a cardiovascular prospective and denies any substernal chest pain.  He  also has no shortness of breath at rest and no orthopnea.  He has no  palpitations or syncope.  He continues to have loud snoring.  He also  reports symptoms consistent with restless leg syndrome.  He did not have his  LFTs or lipids tested recently.   MEDICATIONS:  1. Toprol XL 25 mg a day.  2. Protonix 40 mg a day.  3. Fish oil.  4. Lipitor 10 mg p.o. q. day.  5. Aspirin 81 mg a day.   PHYSICAL EXAMINATION:  VITAL SIGNS:  Blood pressure 124/82.  Heart rate 84  beats per minute.  Weight is 303 pounds.  NECK:  Normal carotid upstrokes.  No carotid bruits.  LUNGS:  Clear breath sounds bilaterally.  HEART:  Regular rate and rhythm. Normal S1, S2.  No murmurs, rubs or  gallops.  ABDOMEN:  Soft, non-tender.  No rebound or guarding.  Good bowel sounds.  EXTREMITIES: Reveals no cyanosis, clubbing or edema.   PROBLEM LIST:  1. Single vessel coronary artery disease.      a.     Status post stent to the carotid artery 2003.      b.     No recurrent angina.  2. Cardiac risk factors.  3. Restless leg syndrome.  4. Rule out obstructive sleep apnea. Patient refused sleep study in the      past.  5. Peripheral vascular disease with right leg claudication - followed  by      Dr. Madilyn Fireman.   PLAN:  1. I have given the patient a prescription for Requip for restless leg      syndrome.  2. Patient has refused sleep study but is willing to do an apnea      __________ and this will be setup.  3. Given the patient recent refills on his medications including Nitro      p.r.n. although he did not use the latter.  4. The patient has been scheduled for an exercise Cardiolite study to      followup on previous      stent placement.  5. We have also scheduled liver function tests and lipid panel.  Learta Codding, MD, [facility]   GED/MedQ  DD:  07/12/2006  DT:  07/13/2006  Job #:  [id number]   cc:   Selinda Flavin

## 2011-04-09 NOTE — Op Note (Signed)
NAME:  HOPPERColman, Birdwell                           ACCOUNT NO.:  [id number]   MEDICAL RECORD NO.:  [id number]                   PATIENT TYPE:  INP   LOCATION:  2871                                 FACILITY:  [facility]   PHYSICIAN:  Hewitt Shorts, M.D.            DATE OF BIRTH:  07/31/1956   DATE OF PROCEDURE:  07/29/2004  DATE OF DISCHARGE:                                 OPERATIVE REPORT   PREOPERATIVE DIAGNOSES:  Recurrent left L4-5 lumbar disk herniations, lumbar  degenerative disk disease, lumbar spondylosis,  lumbar radiculopathy and  lumbar instability.   POSTOPERATIVE DIAGNOSES:  Recurrent left L4-5 lumbar disk herniations,  lumbar degenerative disk disease, lumbar spondylosis, lumbar radiculopathy  and lumbar instability.   PROCEDURE PERFORMED:  Bilateral L4-5 lumbar laminotomy, micro-diskectomy,  post lumbar interbody fusion with ACS Peek interbody implants with Vitoss  with bone marrow aspirate, and bilateral L4-5 posterolateral arthrodesis  with 90D posterior instrumentation and Vitoss with bone marrow aspiration.   SURGEON:  Hewitt Shorts, M.D.   ASSISTANT:  Clydene Fake, M.D.   ANESTHESIA:  General endotracheal   INDICATIONS:  The patient is a 55 year old man who has suffered two  recurrent left L4-5 lumbar disk herniations.  The decision was made to  proceed with decompression and arthrodesis.   DESCRIPTION OF PROCEDURE:  The patient was brought to the operating room and  placed under general endotracheal anesthesia.  The patient was turned to the  prone position and the lumbar region was prepped with Betadine soap and  solution and draped in a sterile fashion.  The previous midline incision was  reopened after the subcutaneous tissues and skin were infiltrated with local  anesthetic with epinephrine.  The incision was extended both rostrally and  caudally.  Bipolar cautery and electrocautery were used to maintain  hemostasis.  Dissection was carried down  to the lumbar fascia, which was  incised bilaterally.  The paraspinal muscles were dissected to the spinous  processes and lamina in a subperiosteal fashion.  The previous laminotomy on  the left side of L4-5 was identified and x-ray was taken to confirm our  localization.  Dissection was then carried out laterally and the transverse  process of L4 and L5 and the inter-transverse space were exposed  bilaterally.  Scar tissue within the laminotomy defect on the left side was  carefully dissected from the edges of the previous laminotomy and BioGlue,  which had been used to help seal a small dural tear from this surgery three  months ago, was removed.  We were able to dissect down to the disk space on  the left side and the disk herniation was removed.  We then entered the disk  space and proceeded with a thorough diskectomy, removing extensive amounts  of degenerative disk material.  The microscope was draped and brought on to  the field to provide instrumentation, illumination and visualization, and  this portion of the disk decompression was performed using micro-dissection  and microsurgical technique.  On the right side, a laminotomy was performed  using the X-Max Drill and Kerrison punches.  The ligamentum flavum was  carefully resected and we identified the thecal sac the exiting L5 nerve  root. These structures were gently retracted medially and a degenerative  disk protrusion was identified.  The remaining annular fibers were incised  and the diskectomy performed.  ___________ overgrowth in the posterior  aspects of L4 and L5 were removed bilaterally and a thorough diskectomy was  performed bilaterally with good decompression of the thecal sac and L5 nerve  roots, bilaterally.  The end plates then of L4 and L5 were prepared with  removal of the cartilaginous portions of the end plates.  Then using a  distraction spacer, we were able to size the disk space up to an 11 mm  height.  We  used a variety of curettes to prepare the end plates further and  then selected the 8 by 11 by 25 mm Peek ACS interbody implants. We then  probed the right L5 pedicle and were able to aspirate bone marrow aspirate  from the L5 vertebral body.  This was injected over a 10 cc strip of Vitoss,  which was saturated with bone marrow aspirate. This Vitoss was then packed  into the two interbody implants.  We were then able to carefully position  the interbody implants within the disk space and they were counter-sunk.  The spacer was removed from the left side and similarly, the implant was  placed on the left side and counter-sunk.  We then took additional Vitoss  with bone marrow aspirate and packed it in and around the interbody implants  to provide further graft for an interbody arthrodesis.  We then brought in a  C-arm fluoroscopic unit and using C-arm fluoroscopy, the remaining three  pedicles were probed.  Each of the pedicles was then tapped with a 5.25 tap.  They were examined with a ball probe, which revealed good threads and no cut-  outs.  We placed 6.75 by 40 mm screws bilaterally at each level.  All of  this was done with C-arm fluoroscopic guidance.  The lateral projection  showed the screws were all in good position and AP projection showed good  trapezoidal trajectories.  We then selected 40 mm rods which positioned in  the screw heads and secured with locking caps.  Final tightening was done of  all four end caps against the counter-torque.  The transverse processes had  been decorticated bilaterally and then, the remaining Vitoss with bone  marrow aspirate was packed over the transverse process and along the  intertransverse spreader bilaterally.  We had once again examined the thecal  sac and nerve roots to insure that no compression of those structures was  present and this was confirmed.  Further throughout the procedure, there is no evidence of cerebrospinal fluid leakage.   Once decompression of the  arthrodesis was completed, we proceeded with closure. The deep fascia was  closed with interrupted, inverted undyed 1 Vicryl sutures, the deep  subcutaneous layers was closed with interrupted, inverted 1 undyed Vicryl  sutures and the subcutaneous and subcuticular closed with interrupted,  inverted 2-0 and 2-0 undyed Vicryl sutures.  The skin was approximated with  Dermabond.   The patient tolerated the procedure well. The estimated blood loss was 250  cc.  We were able to return 100 cc of  self-saver blood to the patient.  Sponge and needle counts were correct. Following surgery, the patient was  turned back to the supine position, reversed from anesthetic, and extubated  and transferred to the recovery room for further care.  He was noted to be  moving all four extremities to command.                                               Hewitt Shorts, M.D.    RWN/MEDQ  D:  07/29/2004  T:  07/29/2004  Job:  [id number]

## 2011-04-09 NOTE — H&P (Signed)
[facility]  Patient:    [name] [name], [name] [name]                        MRN: [id number] Adm. Date:  [id number] Attending:  Barton Fanny                         History and Physical  HISTORY OF PRESENT ILLNESS:  The patient is a 55 year old, right-handed, white male who was evaluated for low back and left lower extremity pain secondary to a large disk herniation. He injured his back four years ago and underwent chiropractor treatment. He improved after about a week. However, at that time, he had low back pain that ran down through his left lower extremity with numbness and tingling. His symptoms recurred four to six weeks ago after he was doing some work at home. He was sitting on a low stool, bent over, and twisted to the side and redeveloped pain, numbness, and paresthesias in a similar distribution to how it had been four years ago. He describes the pain as being in the low back down through the left lower extremity through the buttock, lateral left thigh and leg, and into the foot and into the fourth and fifth toes of his left foot associated with numbness and tingling on the lateral aspect of the left thigh and leg and foot and into the fourth and fifth toes of the left foot. He has some pain in the right buttock, but he does not describe any weakness. He was treated with Naprosyn and Aleve, and he can find a comfortable position lay absolute still and be in little pain but even minimal activity aggravates the pain. He also been tried on Lodine, Medrol Dosepak. He found that the Medrol Dosepak in high doses gave him some relief; but as the dosage diminished, the pain recurred.  PAST MEDICAL HISTORY:  There is no history of hypertension, myocardial infarction, cancer, stroke, diabetes, peptic ulcer disease, or lung disease.  PAST SURGICAL HISTORY:  None.  ALLERGIES:  No known allergies.  CURRENT MEDICATIONS:  A variety of anti-inflammatory  medications.  FAMILY HISTORY:  His father passed on at age 45. He had heart disease and diabetes. His mother is in good health at age 53.  SOCIAL HISTORY:  The patient is married. He is self employed running a trucking firm. He smokes about two packs a day. He has been smoking for 26 years. He drinks alcoholic beverages socially. He denies history of substance abuse.  REVIEW OF SYSTEMS:  Notable for those difficulties described in his history of present illness, past medical history, but otherwise unremarkable.  PHYSICAL EXAMINATION:  GENERAL:  The patient an obese white male in obvious pain and discomfort.  VITAL SIGNS:  Temperature 98.4, pulse 89, blood pressure 125/91, respiratory rate 20, height 5 feet 10 inches, weight 260 pounds.  LUNGS:  Clear to auscultation. He has symmetrical respiratory excursion.  HEART:  Regular rate and rhythm. Normal S1 and S2. There is no murmur.  ABDOMEN:  Soft, nontender. Bowel sounds are present.  EXTREMITIES:  No clubbing, cyanosis, or edema.  MUSCULOSKELETAL:  There is no ______ of the lumbar spinous process or parallel musculature. His mobility is limited to both inflexion and extension due to pain. He is able to flex to about 5 to 10 degrees. He is able to extend to about 5 degrees. Straight leg raising is positive on the left  at 10 degrees. He has a positive crossed straight leg raising on the right at about 20 to 30 degrees with pain shooting down into the left lower extremity.  NEUROLOGICAL:  Shows 5/5 strength in the distal lower extremities, including dorsiflexion, plantar flexion, extensor hallus longus. Sensation is intact to pinprick in the lateral aspect of his left leg. Reflexes are 2 at the quadriceps. However, the left gastrocnemius is absent, on the right gastrocnemius is 2. Toes are downgoing bilaterally.  LABORATORY DATA:  MRI scan of the lumbar spine shows desiccation of the L4-5 and L5-S1 disk with loss of disk space  height at L4-5 consistent with degenerative disk disease at each level. At L4-5 is a large central disk herniation extending bilaterally further to the right than to the left but overall larger mass to the left than to the right. There is a small central to left L5-S1 disk protrusion that abuts but does not appear to compress the thecal sac or left S1 nerve root.  IMPRESSION:  The patient with disabling low back and left lumbar radicular pain with mild discomfort into the right buttock with a large central L4-5 disk herniation that extends bilaterally and a small central left L5-S1 disk herniation with associated underlying degenerative disk disease and spondylosis.  It is felt that he is symptomatic from the L4-5 disk herniation and it is unlikely that the L5-S1 disk herniation is causing significant current symptoms. Although it may have been in fact responsible for his original syndrome of radicular pain four years ago.  PLAN:  The patient will be admitted for a bilateral L4-5 lumbar laminotomy and microdiskectomy. We did discuss all the surgical procedures. We discussed nonsurgical treatment. ______ were discussed with all this. We have elected to proceed with the planned procedure. We discussed the nature of surgery, typical length of surgery, hospital stay, and overall recuperation, his limitations during the postoperative period and risks of surgery including risks of infection, bleeding, possible need for transfusion, the risks of nerve dysfunction, pain, weakness, numbness, paresthesias, the risk of dural tear and CSF leak, possible need for further surgery. The risks of recurrent disk herniation and possible need for further surgery. The risk of progression of degenerative disk disease and increasing back pain with the need for further surgery and anesthetic risks of myocardial infarction, stroke, pneumonia, death. Understanding all this, he does wish to proceed with surgery,  and he is admitted for such. DD:  07/06/00 TD:  07/06/00 Job: [id number]

## 2011-04-09 NOTE — H&P (Signed)
NAME:  [name] [name], [name] [name] [name] NO.:  [id number]   MEDICAL RECORD NO.:  [id number]                   PATIENT TYPE:  INP   LOCATION:  2871                                 FACILITY:  [facility]   PHYSICIAN:  Hewitt Shorts, M.D.            DATE OF BIRTH:  05-25-1956   DATE OF ADMISSION:  07/29/2004  DATE OF DISCHARGE:                                HISTORY & PHYSICAL   HISTORY OF PRESENT ILLNESS:  The patient is a 55 year old right-handed white  male who has been a patient of mine for several years.  He is status post a  left L4-5 lumbar laminectomy and microdiskectomy in August 2001, and he  developed a recurrent disk herniation and underwent surgery in June 2005 for  a recurrent left L4-5 lumbar disk herniation.  Following that surgery the  patient had some ongoing difficulties with low back discomfort.  The low  back pain has steadily worsened.  The patient was re-studied with x-rays and  MRI scan and recurrent disk herniation was revealed once again.  A decision  was made to admit the patient for diskectomy and arthrodesis at the L4-5  level.   PAST MEDICAL HISTORY:  He has a history of cardiovascular disease.  He is  followed by [facility] and underwent a workup this past spring prior  to his June surgery.  He underwent a stenting two years ago and needs to be  cleared for surgery.  He has a history of hypertension, which is treated.  No history of myocardial infarction, cancer, stroke, diabetes, __________,  seizure, lung disease.  Previous surgeries include his two lumbar surgeries  in June 2005 and August 2001, as well as his cardiac stenting two years ago.   He denies allergies to medications.   Medications at the time of admission include:  1.  Lipitor 10 mg q.h.s.  2.  Toprol XL 25 mg daily.  3.  Protonix 40 mg daily.  4.  Aspirin 81 mg daily, which he stopped a week prior to surgery.   FAMILY HISTORY:  His parents have passed on.   SOCIAL HISTORY:  The patient is married.  He is self-employed.  He quit  smoking.  He does not drink alcohol.  There is no history of substance  abuse.   REVIEW OF SYSTEMS:  Notable for what has been described in his history of  present illness and past medical history but is otherwise unremarkable.   PHYSICAL EXAMINATION:  GENERAL:  The patient is an obese white male in  obvious discomfort.  VITAL SIGNS:  Temperature is 98.3, pulse 75, blood pressure 164/94,  respiratory rate 18.  Height is 5 feet 8 inches, weight 260 pounds.  CHEST:  Lungs are clear to auscultation.  He has symmetrical respirations.  CARDIAC:  Regular rate and rhythm, normal S1, S2.  There is no murmur.  ABDOMEN:  Soft, nontender.  Bowel sounds  are present.  EXTREMITIES:  No clubbing, cyanosis, or edema.  MUSCULOSKELETAL:  A well-healed lumbar incision.  There is mild diffuse  discomfort to palpation in the lumbar region.  Mobility is limited in  flexion in extension.  NEUROLOGIC:  5/5 strength to the distal lower extremities, including the  dorsiflexion, plantar flexion, and extensor hallucis longus bilaterally.  Sensation is intact to pinprick.  Quadriceps reflexes are symmetrical.  The  left gastrocnemius reflex is diminished as compared to the right.  Toes are  downgoing bilaterally.   IMPRESSION:  Recurrent left L4-5 lumbar disk herniation in a patient status  post two previous left L4-5 lumbar diskectomies, suggestive of localized  instability at that level.  The decision is made to readmit the patient for  diskectomy, decompression of the neural elements, and arthrodesis.   PLAN:  The patient will be admitted for bilateral L4-5 lumbar laminotomy,  microdiskectomy, and posterior lumbar interbody fusion with interbody  implants and bone graft and a bilateral L4-5 posterolateral arthrodesis with  posterior instrumentation and bone graft.  We discussed alternatives to  surgery, the nature of the surgical  procedure itself, __________ limitations  postoperatively, and postoperative mobilization with a lumbar corset.  We  discussed risks of surgery, including the risks of infection, bleeding, the  possibility of transfusion, the risk of nerve dysfunction with pain,  weakness, numbness, or paresthesias, the risk of degeneration of adjacent  levels, the risk of dural tear and CSF leak with possible need for further  surgery, and anesthetic risks of myocardial infarction, stroke, pneumonia,  and death.   After discussing all of this, the patient does wish to go ahead with  surgery.  Both his and his wife's questions were answered, and the patient  was admitted for such.                                                Hewitt Shorts, M.D.    RWN/MEDQ  D:  07/29/2004  T:  07/29/2004  Job:  [id number]

## 2011-04-09 NOTE — Op Note (Signed)
NAME:  [name] [name], [name] [name] NO.:  [id number]   MEDICAL RECORD NO.:  [id number]                   PATIENT TYPE:  OIB   LOCATION:  3018                                 FACILITY:  [facility]   PHYSICIAN:  Hewitt Shorts, M.D.            DATE OF BIRTH:  01/28/1956   DATE OF PROCEDURE:  05/06/2004  DATE OF DISCHARGE:                                 OPERATIVE REPORT   The patient is a 55 year old right-handed white male, status post left L4-5  lumbar laminectomy and microdiskectomy in August 2001.  He did well from  that surgery but has increasing difficulties over the past year or two which  have been particularly worse over the past few months.  He has had low back  pain extending to the left buttock with aching to the left buttock, lateral  thigh and leg.  Numbness and tingling on the lateral aspect of his left foot  since his last surgery.  He has been treated with Advil, Aleve and Tylenol  without much relief.  He denies any weakness.   The patient was evaluated with x-rays and MRI scan.  This revealed advanced  degeneration at the L4-5 level with somewhat more mild degeneration at the  L5-S1, but with an enormous left L4-5 recurrent disk herniation with severe  thecal  sac and nerve root compression.  The patient is admitted now for  left L4-5 lumbar laminotomy and microdiskectomy for recurrent disk  herniation.  Past medical history notable for history of cardiac disease.  He underwent cardiac stenting two years ago by [facility].  He  has a history of hypertension which is treated.  No history of myocardial  infarction, cancer, stroke, diabetes, peptic ulcer disease or lung disease.  Previous surgery includes lumbar disk surgery four years ago and cardiac  stenting two years ago.  He denies allergies  to medications.  Medications  include Plavix 75 mg daily which was stopped 12 days before surgery, Lipitor  10 mg daily, Toprol XL 25 mg daily,  Protonix 40 mg daily, nitroglycerin  tablets as needed, fish oil daily and aspirin 81 mg daily.   FAMILY HISTORY:  His parents have passed on.   SOCIAL HISTORY:  The patient is married.  He is self-employed.  He quit  smoking.  He does not drink alcoholic beverages.  He denies history of  substance abuse.   REVIEW OF SYSTEMS:  Notable for those described in history of present  illness and past medical history, but otherwise unremarkable.   PHYSICAL EXAMINATION:  GENERAL:  The patient is an obese white male in no  acute distress.  VITAL SIGNS:  Temperature 97.6, pulse 82, blood pressure 134/92, respiratory  rate 18.  Height 5 feet 10 inches.  Weight 267 pounds.  LUNGS:  Clear to auscultation.  He has symmetric respiratory excursions.  HEART:  Regular rate and rhythm, S1, S2.  No  murmur.  ABDOMEN:  Soft, nondistended.  Bowel sounds are present.  EXTREMITIES:  Examination shows no cyanosis, clubbing or edema.  MUSCULOSKELETAL:  Examination shows mild ___________ lower lumber region,  left paralumbar region, none in the right paralumbar region.  He is able to  flex 90 degrees, able to extend, but had more discomfort on extension.  Straight leg raise is negative bilaterally.  NEUROLOGIC:  Examination showed 5/5 strength in distal lower extremities  including dorsiflexion, plantar flexion, __________ bilaterally.  Sensation  intact to pinprick to the distal lower extremities.  Reflexes quadriceps are  3 bilaterally, gastrocnemius on the left is minimal, on the right is 1.  Toes are down bilaterally.  There is normal gait and stance although he  favors the left lower extremity to some extent.   IMPRESSION:  Recurrent left lumbar radiculopathy secondary to a large  recurrent left L4-5 lumbar disk herniation.  The patient has undergone  preoperative cardiology clearance with Dr. Andee Lineman from [facility].  We have discussed the nature of surgery, typical surgery, hospital stay and   overall recuperation, limitations postoperatively and risks including risks  of infection, bleeding, possible need for transfusion, risk of nerve  dysfunction, pain, weakness, numbness, paresthesias, risk of recurrent disk  herniation, possible need for further surgery, risk of dural tear and CSF  leak, possible need for another surgery, anesthetic risks, myocardial  infarction, stroke, pneumonia and death.  After discussing all of this he  does wish to go ahead with surgery and is admitted for such.                                               Hewitt Shorts, M.D.    RWN/MEDQ  D:  05/06/2004  T:  05/06/2004  Job:  [id number]

## 2011-04-09 NOTE — Consult Note (Signed)
NAME:  [name] [name], [place] NO.:  [id number]   MEDICAL RECORD NO.:  [id number]                   PATIENT TYPE:  OIB   LOCATION:  3018                                 FACILITY:  [facility]   PHYSICIAN:  Rozanna Boer., M.D.      DATE OF BIRTH:  [birth year]   DATE OF CONSULTATION:  05/06/2004  DATE OF DISCHARGE:                                   CONSULTATION   REQUESTED BY:  Dr. Hewitt Shorts.   REASON FOR CONSULTATION:  Urinary retention, postop lumbar diskectomy.   HISTORY OF PRESENT ILLNESS:  This 55 year old patient had a recurrent lumbar  disk, underwent a L4-5 diskectomy by Dr. Newell Coral on May 04, 2004.  Since  surgery, he has been having difficulty voiding but has not been able to get  off his back.  Today, he went into urinary retention, the nurses passed a  catheter that never drained any urine and it was very painful.  When they  removed it, he had a lot of blood per urethra.  He says he has never had  previous voiding problems before, never had a urinary tract infection, no  history of strictures, no history of GU trauma, no history of kidney stones.   PAST MEDICAL HISTORY:  He is an ex-smoker, quit about a year and a half ago.  No heart problems since a stent in 2003 after an MI.  He has GERD and mild  hypertension.   MEDICINES:  Medicines include:  1. Plavix.  2. Baby aspirin.  3. Toprol-XL 10 mg a day.  4. Protonix 40 mg a day.  5. Advil p.r.n.   ALLERGIES:  He has no allergies.   PAST MEDICAL HISTORY, SOCIAL HISTORY AND REVIEW OF SYSTEMS:  As noted on the  chart.  He lives in [place] next to Dr. Dennie Maizes, a urologist up there.  He is married and has 3 children and 1 grandchild.   PHYSICAL EXAMINATION:  VITAL SIGNS:  Vital signs are stable; his temperature  is 98, his blood pressure is 135/86, pulse 73 and regular, respirations 18.  GENERAL:  He is a heavy-set goateed white male, lying quietly in bed,  complaining of  inability to empty his bladder.  HEENT:  Clear.  NECK:  Neck is supple.  LUNGS:  Clear.  ABDOMEN:  Abdomen obese and soft.  Bladder is distended.  Liver and spleen  not palpable.  GU:  Penis is uncircumcised.  There are old blood clots draining from his  penis.  Testes are normal without tenderness, bilaterally descended.  RECTAL:  His prostate is only moderately enlarged.  BACK:  He has a dressing on his back from previous surgery.  EXTREMITIES:  Extremities negative, no edema and good distal pulses.   DESCRIPTION OF PROCEDURE:  With local Xylocaine anesthesia, I inserted a #16  coude catheter into the bladder and had immediate return of over 800 mL of  clear urine.  He did have a little bit of bleeding around the catheter but  this was minimal.  The patient had immediate relief and the catheter was  left to straight drainage.   IMPRESSION:  1. Urinary retention.  2. Recent lumbar back surgery.   RECOMMEND:  1. Leave Foley catheter till fully ambulatory in a couple of days and then     do a voiding trial.  2. Start Flomax 0.4 mg a day now and continue until he gets home and maybe     even for a week or so after he gets home.  This will just help ensure     that he continues to void well.  I do not think he will need to take this     long-term.  He lives next door to a urologist so he can get his followup     care at home.  3. Call again for any problems.                                               Rozanna Boer., M.D.    HMK/MEDQ  D:  05/06/2004  T:  05/07/2004  Job:  [id number]

## 2011-06-24 ENCOUNTER — Ambulatory Visit (INDEPENDENT_AMBULATORY_CARE_PROVIDER_SITE_OTHER): Payer: PRIVATE HEALTH INSURANCE | Admitting: Surgery

## 2011-06-24 ENCOUNTER — Other Ambulatory Visit (INDEPENDENT_AMBULATORY_CARE_PROVIDER_SITE_OTHER): Payer: Self-pay | Admitting: General Surgery

## 2011-06-24 ENCOUNTER — Encounter (INDEPENDENT_AMBULATORY_CARE_PROVIDER_SITE_OTHER): Payer: Self-pay | Admitting: Surgery

## 2011-06-24 VITALS — BP 128/74 | HR 64 | Temp 97.2°F | Resp 16 | Ht 70.0 in | Wt 282.0 lb

## 2011-06-24 DIAGNOSIS — E119 Type 2 diabetes mellitus without complications: Secondary | ICD-10-CM

## 2011-06-24 NOTE — Progress Notes (Signed)
Subjective:     Patient ID: [name] Gray, male   DOB: 1956/04/05, 55 y.o.   MRN: [id number]  HPIThis 55 year old white male comes in today with his wife to discuss his desire for lapband surgery.  He has developed morbid obesity since injuring his back and having 2 back surgeries that have left him limited in his exercise ability.  He has devoloped DM and realizes that this will have an impact on his longevity.     Review of Systems15 point review of systems is positive for high blood pressure, heart disease, poor circulation including the least one peripheral vascular stent in his leg he said he has a need for another. He's also had prior cardiac stenting. The remainder of his review of systems is normal.     Objective:   Physical Exam  Physical exam reveals an obese white male in no distress. Today's weight is 282. BMI is 40. Blood pressure 128/74. Afebrile heart rate 64. Respirations 16. Height 5 feet 10 inches. Girth is 55 inches  Head normocephalic eyes sclera nonicteric. Pupils are equal round and react to light. Nose and throat are unremarkable.  Neck supple. No adenopathy noted. No carotid bruits are appreciated.  Chest clear to auscultation.  Heart sinus rhythm. No murmurs or gallops.  Abdomen protuberant, nontender  Extremities evidence of claudication but history of stenting. No history of DVT.  Neuro: Alert and oriented x3. Motor and sensory function are grossly intact.  Psych: Patient demonstrated appropriate affect and a good fund of knowledge. He has done research on this subject and demonstrated that to me and our discussion..     Assessment:    Good candidate for lapband surgery.  Booklet given.      Plan:     Begin the bariatric workup journey.

## 2011-06-24 NOTE — Patient Instructions (Signed)
Begin lower carb diet now and try to reduce BMI prior to surgery

## 2011-06-25 ENCOUNTER — Encounter: Payer: Self-pay | Admitting: Cardiology

## 2011-07-05 ENCOUNTER — Telehealth: Payer: Self-pay | Admitting: *Deleted

## 2011-07-05 NOTE — Telephone Encounter (Signed)
[name] Gray called the office today stating that someone had called him last week. I do not see any notes Indicating that anyone called from this office. He said well it could have been Dr. Margarita Mail nurse. Can you verify if you called him.  Please call his cell # [phone]

## 2011-07-05 NOTE — Telephone Encounter (Signed)
Discussed upcoming OV with pt.  Scheduled for 9/4 with GD for cardiac clearance for gastric lap band surgery.

## 2011-07-14 ENCOUNTER — Other Ambulatory Visit: Payer: Self-pay

## 2011-07-14 ENCOUNTER — Ambulatory Visit (HOSPITAL_COMMUNITY)
Admission: RE | Admit: 2011-07-14 | Discharge: 2011-07-14 | Disposition: A | Discharge status: Home / Self Care | Payer: PRIVATE HEALTH INSURANCE | Source: Ambulatory Visit | Attending: Surgery | Admitting: Surgery

## 2011-07-14 DIAGNOSIS — Z6841 Body Mass Index (BMI) 40.0 and over, adult: Secondary | ICD-10-CM | POA: Insufficient documentation

## 2011-07-14 DIAGNOSIS — K8020 Calculus of gallbladder without cholecystitis without obstruction: Secondary | ICD-10-CM | POA: Insufficient documentation

## 2011-07-14 DIAGNOSIS — K449 Diaphragmatic hernia without obstruction or gangrene: Secondary | ICD-10-CM | POA: Insufficient documentation

## 2011-07-14 DIAGNOSIS — E119 Type 2 diabetes mellitus without complications: Secondary | ICD-10-CM | POA: Insufficient documentation

## 2011-07-14 DIAGNOSIS — I10 Essential (primary) hypertension: Secondary | ICD-10-CM | POA: Insufficient documentation

## 2011-07-14 DIAGNOSIS — K219 Gastro-esophageal reflux disease without esophagitis: Secondary | ICD-10-CM | POA: Insufficient documentation

## 2011-07-19 ENCOUNTER — Ambulatory Visit (HOSPITAL_COMMUNITY)
Admission: RE | Admit: 2011-07-19 | Discharge: 2011-07-19 | Disposition: A | Discharge status: Home / Self Care | Payer: PRIVATE HEALTH INSURANCE | Source: Ambulatory Visit | Attending: Surgery | Admitting: Surgery

## 2011-07-19 DIAGNOSIS — Z6841 Body Mass Index (BMI) 40.0 and over, adult: Secondary | ICD-10-CM | POA: Insufficient documentation

## 2011-07-19 DIAGNOSIS — M6289 Other specified disorders of muscle: Secondary | ICD-10-CM | POA: Insufficient documentation

## 2011-07-20 ENCOUNTER — Encounter: Payer: Self-pay | Admitting: *Deleted

## 2011-07-20 ENCOUNTER — Encounter: Payer: PRIVATE HEALTH INSURANCE | Attending: Surgery | Admitting: *Deleted

## 2011-07-20 DIAGNOSIS — Z01818 Encounter for other preprocedural examination: Secondary | ICD-10-CM | POA: Insufficient documentation

## 2011-07-20 DIAGNOSIS — Z713 Dietary counseling and surveillance: Secondary | ICD-10-CM | POA: Insufficient documentation

## 2011-07-20 NOTE — Progress Notes (Signed)
  Patient was seen on 07/20/2011 for Pre-Operative LAGB Nutrition Assessment. Assessment and letter of approval faxed to [facility] Bariatric Surgery Program coordinator on 07/20/2011.    Handouts given during visit include:  Pre-Op Goals Handout  Patient to call for Pre-Op and Post-Op Nutrition Education at the [facility] when surgery is scheduled.

## 2011-07-20 NOTE — Patient Instructions (Signed)
   Follow Pre-Op Nutrition Goals to prepare for LAGB Surgery.   Call the [facility] at [phone] once you have been given your surgery date to enrolled in the Pre-Op Nutrition Class. You will need to attend this nutrition class 3-4 weeks prior to your surgery. 

## 2011-07-22 ENCOUNTER — Encounter (INDEPENDENT_AMBULATORY_CARE_PROVIDER_SITE_OTHER): Payer: Self-pay | Admitting: General Surgery

## 2011-07-23 ENCOUNTER — Encounter: Payer: Self-pay | Admitting: Cardiology

## 2011-07-26 ENCOUNTER — Emergency Department (HOSPITAL_COMMUNITY): Payer: PRIVATE HEALTH INSURANCE

## 2011-07-26 ENCOUNTER — Inpatient Hospital Stay (HOSPITAL_COMMUNITY)
Admission: EM | Admit: 2011-07-26 | Discharge: 2011-07-28 | DRG: 247 | Disposition: A | Discharge status: Home / Self Care | Payer: PRIVATE HEALTH INSURANCE | Attending: Internal Medicine | Admitting: Internal Medicine

## 2011-07-26 DIAGNOSIS — I200 Unstable angina: Secondary | ICD-10-CM | POA: Diagnosis present

## 2011-07-26 DIAGNOSIS — R079 Chest pain, unspecified: Secondary | ICD-10-CM

## 2011-07-26 DIAGNOSIS — Y831 Surgical operation with implant of artificial internal device as the cause of abnormal reaction of the patient, or of later complication, without mention of misadventure at the time of the procedure: Secondary | ICD-10-CM | POA: Diagnosis present

## 2011-07-26 DIAGNOSIS — T82897A Other specified complication of cardiac prosthetic devices, implants and grafts, initial encounter: Principal | ICD-10-CM | POA: Diagnosis present

## 2011-07-26 DIAGNOSIS — Z7902 Long term (current) use of antithrombotics/antiplatelets: Secondary | ICD-10-CM

## 2011-07-26 DIAGNOSIS — I739 Peripheral vascular disease, unspecified: Secondary | ICD-10-CM | POA: Diagnosis present

## 2011-07-26 DIAGNOSIS — E7800 Pure hypercholesterolemia, unspecified: Secondary | ICD-10-CM | POA: Diagnosis present

## 2011-07-26 DIAGNOSIS — Z9119 Patient's noncompliance with other medical treatment and regimen: Secondary | ICD-10-CM

## 2011-07-26 DIAGNOSIS — I2510 Atherosclerotic heart disease of native coronary artery without angina pectoris: Secondary | ICD-10-CM | POA: Diagnosis present

## 2011-07-26 DIAGNOSIS — Z91199 Patient's noncompliance with other medical treatment and regimen due to unspecified reason: Secondary | ICD-10-CM

## 2011-07-26 DIAGNOSIS — E119 Type 2 diabetes mellitus without complications: Secondary | ICD-10-CM | POA: Diagnosis present

## 2011-07-26 DIAGNOSIS — Z7982 Long term (current) use of aspirin: Secondary | ICD-10-CM

## 2011-07-26 DIAGNOSIS — G4733 Obstructive sleep apnea (adult) (pediatric): Secondary | ICD-10-CM | POA: Diagnosis present

## 2011-07-26 DIAGNOSIS — I10 Essential (primary) hypertension: Secondary | ICD-10-CM | POA: Diagnosis present

## 2011-07-26 LAB — CBC
HCT: 46.5 % (ref 39.0–52.0)
Hemoglobin: 16.7 g/dL (ref 13.0–17.0)
MCH: 33.5 pg (ref 26.0–34.0)
MCHC: 35.9 g/dL (ref 30.0–36.0)
MCV: 93.2 fL (ref 78.0–100.0)
Platelets: 169 K/uL (ref 150–400)
RBC: 4.99 MIL/uL (ref 4.22–5.81)
RDW: 12.6 % (ref 11.5–15.5)
WBC: 9.1 10*3/uL (ref 4.0–10.5)

## 2011-07-26 LAB — COMPREHENSIVE METABOLIC PANEL
ALT: 29 U/L (ref 0–53)
Alkaline Phosphatase: 63 U/L (ref 39–117)
CO2: 25 mEq/L (ref 19–32)
GFR calc Af Amer: >60 mL/min (ref >60)
Glucose, Bld: 357 mg/dL — ABNORMAL HIGH (ref 70–99)
Potassium: 3.7 mEq/L (ref 3.5–5.1)
Sodium: 135 mEq/L (ref 135–145)
Total Protein: 8.2 g/dL (ref 6.0–8.3)

## 2011-07-26 LAB — POCT I-STAT TROPONIN I: Troponin i, poc: 0 ng/mL (ref 0.00–0.08)

## 2011-07-26 LAB — COMPREHENSIVE METABOLIC PANEL WITH GFR
AST: 14 U/L (ref 0–37)
Albumin: 4.2 g/dL (ref 3.5–5.2)
BUN: 16 mg/dL (ref 6–23)
Calcium: 10.1 mg/dL (ref 8.4–10.5)
Chloride: 96 meq/L (ref 96–112)
Creatinine, Ser: 0.74 mg/dL (ref 0.50–1.35)
GFR calc non Af Amer: >60 mL/min (ref >60)
Total Bilirubin: 0.4 mg/dL (ref 0.3–1.2)

## 2011-07-26 LAB — CARDIAC PANEL(CRET KIN+CKTOT+MB+TROPI)
Relative Index: INVALID (ref 0.0–2.5)
Troponin I: <0.3 ng/mL (ref <0.30)

## 2011-07-26 LAB — APTT: aPTT: 59 seconds — ABNORMAL HIGH (ref 24–37)

## 2011-07-27 ENCOUNTER — Encounter: Payer: Self-pay | Admitting: Cardiology

## 2011-07-27 DIAGNOSIS — I2510 Atherosclerotic heart disease of native coronary artery without angina pectoris: Secondary | ICD-10-CM

## 2011-07-27 LAB — BASIC METABOLIC PANEL
Calcium: 9.3 mg/dL (ref 8.4–10.5)
Creatinine, Ser: 0.67 mg/dL (ref 0.50–1.35)
GFR calc Af Amer: >60 mL/min (ref >60)
GFR calc non Af Amer: >60 mL/min (ref >60)

## 2011-07-27 LAB — GLUCOSE, CAPILLARY
Glucose-Capillary: 238 mg/dL — ABNORMAL HIGH (ref 70–99)
Glucose-Capillary: 183 mg/dL — ABNORMAL HIGH (ref 70–99)

## 2011-07-27 LAB — CARDIAC PANEL(CRET KIN+CKTOT+MB+TROPI)
Relative Index: INVALID (ref 0.0–2.5)
Total CK: 27 U/L (ref 7–232)

## 2011-07-27 LAB — CBC
MCH: 32.3 pg (ref 26.0–34.0)
MCHC: 34.9 g/dL (ref 30.0–36.0)
Platelets: 151 10*3/uL (ref 150–400)
RBC: 4.64 MIL/uL (ref 4.22–5.81)
RDW: 12.5 % (ref 11.5–15.5)

## 2011-07-27 LAB — LIPID PANEL
Cholesterol: 140 mg/dL (ref 0–200)
VLDL: 54 mg/dL — ABNORMAL HIGH (ref 0–40)

## 2011-07-27 LAB — HEMOGLOBIN A1C
Hgb A1c MFr Bld: 11 % — ABNORMAL HIGH (ref <5.7)
Mean Plasma Glucose: 269 mg/dL — ABNORMAL HIGH (ref <117)

## 2011-07-27 LAB — HEPARIN LEVEL (UNFRACTIONATED): Heparin Unfractionated: 0.53 IU/mL (ref 0.30–0.70)

## 2011-07-27 LAB — POCT ACTIVATED CLOTTING TIME: Activated Clotting Time: 419 seconds

## 2011-07-28 LAB — BASIC METABOLIC PANEL
BUN: 11 mg/dL (ref 6–23)
CO2: 23 mEq/L (ref 19–32)
Chloride: 101 mEq/L (ref 96–112)
Creatinine, Ser: 0.68 mg/dL (ref 0.50–1.35)

## 2011-07-28 LAB — CARDIAC PANEL(CRET KIN+CKTOT+MB+TROPI): Troponin I: <0.3 ng/mL (ref <0.30)

## 2011-07-28 LAB — CBC
MCH: 32.7 pg (ref 26.0–34.0)
MCV: 92.6 fL (ref 78.0–100.0)
Platelets: 149 10*3/uL — ABNORMAL LOW (ref 150–400)
RDW: 12.5 % (ref 11.5–15.5)
WBC: 7.2 10*3/uL (ref 4.0–10.5)

## 2011-07-28 LAB — GLUCOSE, CAPILLARY

## 2011-07-29 NOTE — H&P (Signed)
NAME:  [name] Gray, [name] Gray NO.:  [id number]  MEDICAL RECORD NO.:  [id number]  LOCATION:  [facility]                         FACILITY:  [facility]  PHYSICIAN:  Hillis Range, MD       DATE OF BIRTH:  Nov 17, 1956  DATE OF ADMISSION:  07/26/2011 DATE OF DISCHARGE:                             HISTORY & PHYSICAL   CHIEF COMPLAINT:  Chest pain.  HISTORY OF PRESENT ILLNESS:  [name] Gray is a pleasant 55 year old gentleman with a history of obesity, diabetes, hypertension, peripheral vascular disease, and coronary disease who presents complaining of chest pain.  He reports being in his usual state of health until on August 24 when he developed substernal chest discomfort.  He initially thought that this discomfort was due to indigestion.  He reports taking Pepcid with some improvement.  Since that time he has had steadily increasing frequency and duration of substernal chest discomfort.  He now finds that with minimal ambulation he develops chest discomfort.  In retrospect he feels that this is quite similar to the chest pain that he had prior to his prior percutaneous coronary interventions in 2003 and most recently in October 2011.  He has begun taking several nitroglycerin a day which he has found to relieve his chest pain.  He describes his chest discomfort as moderate in intensity with radiation into his left arm.  He has also noticed increasing shortness of breath over the past few days.  Over the past 2 days, he has had orthopnea and has had difficulty sleeping and therefore has had to sleep in recliner. He recently was at the beach on vacation and has just returned home. Today's chest discomfort did not relieve with nitroglycerin and therefore he presents to [facility] for further evaluation.  The patient does have a known history of coronary disease and underwent bare metal stenting of his RCA in 2003.  He did well until October 2011 when he returned with similar chest pain  and underwent left heart catheterization at that time which revealed 20% stenosis of the distal left main, 30% stenosis of the LAD, and 99% stenosis in the very distal portion of the mid segment of the right coronary artery.  This was just prior to the beginning of the bare metal stent that had been placed in the distal vessel in 2003.  There was 20% in-stent restenosis.  The patient underwent percutaneous coronary intervention to the mid RCA with a drug-eluting stent.  He has been treated with Plavix initially and subsequently with Effient.  He has been noncompliant with cardiology followup and has not seen Dr. Andee Lineman since that time.  He does, however, report that he has been compliant with his medical therapies.  PAST MEDICAL HISTORY: 1. Coronary artery disease status post bare metal stenting of the RCA     in 2003 and stenting of the mid RCA with a drug-eluting stent on     September 07, 2010. 2. Morbid obesity. 3. Diabetes. 4. Hypertension. 5. Hyperlipidemia. 6. Peripheral vascular disease status post PTA of the left femoral     artery in 2006.  He does have right SFA disease but his right ABI  was previously 0.95. 7. Sleep apnea for which he uses CPAP.  ALLERGIES:  No known drug allergies.  HOME MEDICATIONS: 1. Aspirin 325 mg daily. 2. Effient 10 mg daily. 3. Toprol-XL 25 mg daily. 4. Lisinopril/hydrochlorothiazide 10/12.5 mg daily. 5. Lipitor 10 mg daily. 6. Metformin ER 1000 mg b.i.d.  SOCIAL HISTORY:  The patient lives with his spouse in [place].  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  He has a history of tobacco but quit in 2003.  He rarely drinks alcohol and denies drug use.  FAMILY HISTORY:  Notable for coronary disease with his father.  REVIEW OF SYSTEMS:  All systems reviewed and negative except as outlined in the HPI above.  PHYSICAL EXAMINATION:  TELEMETRY:  Reveals sinus rhythm. VITALS:  Blood pressure 118/68, heart rate 88, respirations 18, sats 99% on room  air. GENERAL:  The patient is a morbidly obese male in no acute distress.  He is alert and oriented x3. HEENT:  Normocephalic, atraumatic.  Sclerae clear.  Conjunctivae pink. Oropharynx clear. NECK:  Supple.  JVP 7 cm. LUNGS:  Clear. HEART:  Regular rate and rhythm.  No murmurs, rubs, or gallops. GI:  Soft, nontender, nondistended.  Positive bowel sounds. EXTREMITIES:  No clubbing, cyanosis, or edema. SKIN:  No ecchymoses or lacerations. MUSCULOSKELETAL:  No deformity or atrophy. PSYCH:  Euthymic mood, full affect. NEURO:  Strength and sensation are intact.  EKG reveals sinus rhythm at 88 beats per minute.  There are no ischemic changes and the EKG is otherwise unremarkable.  Chest x-ray reveals no acute airspace disease upon my review.  LABORATORY DATA:  Potassium 3.7, glucose 357, BUN 16, creatinine 0.74. LFTs are normal.  Troponin 0.  White blood cell count 9.1, hematocrit 46, platelets 169,000.  IMPRESSION:  [name] Gray is a pleasant 55 year old gentleman with a known history of coronary disease, diabetes, hypertension, peripheral vascular disease, and obesity who now presents with chest discomfort.  He reports crescendo angina over the past 7-10 days.  He feels that this discomfort is quite similar to his prior anginal pain.  He has found some relief initially with nitroglycerin though he has required increasing nitroglycerin over the past few days.  Presently his EKG is without ischemic findings and his initial troponin is normal.  I think that the most prudent strategy at this time would be to admit [name] Gray to the Cardiology Service for further evaluation and management of acute coronary syndrome.  He will be placed on telemetry. We will follow with serial cardiac markers.  In the interim, he is placed on a heparin drip.  I have also placed a nitroglycerin patch.  We will continue aspirin, Effient, Toprol, and Lipitor.  I think that we should also proceed with left  heart catheterization at the next available time.  Risks, benefits, and alternatives to left heart catheterization were discussed in detail with the patient and his spouse.  They understand the risks and wished to proceed.  We will also follow the patient's blood sugars while his metformin is on hold and follow with sliding scale coverage.  He will be resumed on his home CPAP regimen and we will follow his blood pressure closely while in the hospital.  Given his recent shortness of breath, we will gently diurese the patient and obtain an echocardiogram.  The importance of long term outpatient followup was also stressed with the patient today.     Hillis Range, MD     JA/MEDQ  D:  07/26/2011  T:  07/26/2011  Job:  [id number]  cc:   Learta Codding, MD,FACC  Electronically Signed by Hillis Range MD on 07/29/2011 08:54:36 AM

## 2011-08-02 NOTE — Discharge Summary (Addendum)
NAME:  [name] Gray, [place] NO.:  [id number]  MEDICAL RECORD NO.:  [id number]  LOCATION:  2505                         FACILITY:  [facility]  PHYSICIAN:  Arturo Morton. Riley Kill, MD, FACCDATE OF BIRTH:  Feb 22, 1956  DATE OF ADMISSION:  07/26/2011 DATE OF DISCHARGE:  07/28/2011                              DISCHARGE SUMMARY   DISCHARGE DIAGNOSES: 1. Chest pain concerning for unstable angina with catheterization this     admission as below. 2. Coronary artery disease.     a.     Status post percutaneous stenting of in-stent restenosis of      previous site of drug-eluting stent implantation in the mid right      coronary artery, July 27, 2011.     b.     Status post bare-metal stenting in right coronary artery in      2003 and stenting of the mid right coronary artery with a drug-      eluting stent in October 2011. 3. Morbid obesity. 4. Diabetes mellitus, uncontrolled. 5. Hypertension. 6. Hyperlipidemia. 7. Peripheral vascular disease status post percutaneous transluminal     angioplasty of the left femoral artery in 2006.  Right superficial     femoral artery disease present.  Right ABI was previously 0.95. 8. Sleep apnea, on CPAP. 9. History of noncompliance with followup.  HOSPITAL COURSE:  [name] Gray is a 55 year old gentleman with a history of coronary artery disease who presented to [facility] with complaints of substernal chest pain with minimal activity from most of her prior anginal symptoms.  The pain has increased over the last week, associated with dyspnea and exertion.  On evaluation by Cardiology in the ER, the pain had improved with sublingual nitroglycerin.  Cardiac enzymes were cycled which were negative initially.  He was admitted to the hospital for further rule out and cardiac enzymes remained negative. Given his history of PVD, Dr. Johney Frame recommended catheterization to rule out ischemia via the radial approach.  The patient underwent  this procedure yesterday by Dr. Elease Hashimoto findings single-vessel coronary artery disease with restenosis of the distal RCA.  This area has already been stented twice.  Dr. Elease Hashimoto discussed this case with Dr. Riley Kill who performed successful percutaneous stenting of in-stent restenosis at the previous site of drug-eluting stent implantation in the mid RCA with a Promus drug-eluting stent.  The patient remained on aspirin and Effient as well as his beta-blocker and statin.  Blood sugars were uncontrolled this admission.  He was strongly encouraged by Cardiac Rehab.  He has participated in outpatient diabetic education process which has been ordered.  Dr. Riley Kill has seen and examined the patient today and feels he is stable for discharge. The patient requested a prescription for Protonix which was granted given that he is on Aspirin/Effient for GI prophylaxis.  DISCHARGE LABORATORY DATA:  WBC 7.2, hemoglobin 15.1, hematocrit 42.8, and platelet count 149.  Sodium 136, potassium 3.7, chloride 101, CO2 23, glucose 226, BUN 11, and creatinine 0.68.  LFTs were normal.  A1c 11.0.  Cardiac enzymes negative x5.  Total cholesterol 140, triglycerides 271, HDL 28, and LDL 58.  STUDIES: 1. Chest x-ray, July 26, 2011 showed  mild chronic bronchitic type     1 changes.  Low lung volumes with vascular crowding and     atelectasis. 2. Cardiac catheterization, July 27, 2011, see full report for     details.  DISCHARGE MEDICATIONS: 1. Aspirin 81 mg daily. 2. Lipitor 10 mg p.o. daily. 3. Lisinopril/hydrochlorothiazide 10/12.5 mg daily. 4. Metformin XR 1000 mg b.i.d. with instructions not to restart until     July 29, 2011. 5. Metoprolol XL succinate 25 mg daily. 6. Nitroglycerin sublingual 0.4 mg p.r.n. chest pain up to 3 doses. 7. Prasugrel 10 mg daily. 8. Protonix 40mg  daily.  DISPOSITION:  [name] Gray will be discharged in stable condition to home. He is to increase activity slowly and not  to lift anything over 5 pounds for 1 week, drive for 2 days, and not to participate in sexual activity for 1 week.  He is to follow a low-sodium heart-healthy diabetic diet and call or return if he notices any pain, swelling, bleeding, or pus at cath site.  He will follow with Dr. Andee Lineman on August 19, 2011 at 3:00 p.m.  He is also to follow up with his primary care provider as his diabetes is uncontrolled.  He is to do so within 1 week.  DURATION OF DISCHARGE ENCOUNTER:  Greater than 30 minutes including physician PA time.     [name], P.A.C.   ______________________________ Arturo Morton Riley Kill, MD, [facility]    DD/MEDQ  D:  07/28/2011  T:  07/28/2011  Job:  [id number]  cc:   Learta Codding, MD,FACC Selinda Flavin, MD  Electronically Signed by Ronie Spies  on 08/02/2011 06:31:36 PM Electronically Signed by Shawnie Pons MD [facility] on 08/04/2011 06:09:07 PM

## 2011-08-02 NOTE — Cardiovascular Report (Signed)
  NAMEHILMAN, KISSLING NO.:  [id number]  MEDICAL RECORD NO.:  [id number]  LOCATION:  2505                         FACILITY:  [facility]  PHYSICIAN:  Vesta Mixer, M.D. DATE OF BIRTH:  Aug 11, 1956  DATE OF PROCEDURE:  07/27/2011 DATE OF DISCHARGE:                           CARDIAC CATHETERIZATION   [name] Gray is a 55 year old gentleman with a history of coronary artery disease.  He is status post PTCA and stenting of his distal right coronary artery in the past.  He had a 3.5 mm x 28-mm Zeta stent (bare metal stent) in 2003.  He then restenosed the same area and had a Promus stent placed in 2011 by Dr. Clifton James.  This Promus stent was a 3.0 x 15 mm Promus stent postdilated using a 3.25 x 12-mm balloon.  The patient presents to the hospital now with recurrent episodes of chest discomfort very similar to his previous episodes of angina.  He was scheduled for a cardiac catheterization based on these findings.  PROCEDURE:  Left heart catheterization with coronary angiography.  The right femoral artery was easily cannulated using a modified Seldinger technique.  Hemodynamics:  LV pressure is 134/12.  The aortic pressure is 130/77.  Angiography, left main.  The left main has minor luminal irregularities. There is mild calcification.  The left anterior descending artery:  The left anterior descending artery is mildly calcified.  The mid and distal have minor luminal irregularities.  The diagonal vessels are very small and have minor luminal irregularities.  The distal LAD is fairly small but is unremarkable.  The left circumflex artery is a small vessel and provides a first obtuse marginal distribution.  There are minor luminal irregularities.  The right coronary artery is very large and is dominant.  There is a 30% prox/mid stenosis.  The distal RCA has a 95-99% stenosis within the previously stented segment.  It is likely that this area has 2 stents  in the same location.  There is TIMI grade 3 flow through this lesion.  The posterior descending artery and posterolateral segment artery are normal.  The left ventriculogram was performed in a 30 RAO position.  It reveals normal left ventricular systolic function.  COMPLICATIONS:  None.  CONCLUSIONS:  Single vessel coronary artery disease with restenosis of his distal right coronary artery.  It appears that this area has already been stented twice.  The first time with a Zeta stent in 2003 and then with a Promus stent in 2011.  He will likely need PCI again today.  I have discussed the case with Dr. Riley Kill who will address this lesion tonight.     Vesta Mixer, M.D.     PJN/MEDQ  D:  07/27/2011  T:  07/28/2011  Job:  [id number]  cc:   Arturo Morton. Riley Kill, MD, [facility] Learta Codding, MD,FACC Selinda Flavin, MD  Electronically Signed by Kristeen Miss M.D. on 08/02/2011 07:45:20 PM

## 2011-08-04 NOTE — Cardiovascular Report (Signed)
NAME:  [name] Gray, [name] Gray NO.:  [id number]  MEDICAL RECORD NO.:  [id number]  LOCATION:  2505                         FACILITY:  [facility]  PHYSICIAN:  Arturo Morton. Riley Kill, MD, FACCDATE OF BIRTH:  September 06, 1956  DATE OF PROCEDURE:  07/27/2011 DATE OF DISCHARGE:                           CARDIAC CATHETERIZATION   INDICATIONS:  This gentleman has diabetes and obstructive sleep apnea. He has hypercholesterolemia.  He has had prior stenting of the right coronary artery distally in 2003 by Dr. Chales Abrahams.  That stent is widely patent.  He had a second stent, a drug-eluting PROMUS stent placed in the end of 2011.  This was placed in the mid right coronary artery.  He underwent diagnostic catheterization by Dr. Kristeen Miss today and had high-grade in-stent restenosis in the drug-eluting stent.  As a result, we elected to recommend repeat percutaneous coronary intervention.  The patient was agreeable to proceed.  PROCEDURE:  Percutaneous stenting of the right coronary artery in an area of in-stent restenosis using Medtronic Resolute drug-eluting stent.  DESCRIPTION OF PROCEDURE:  The procedure was performed from the right femoral artery.  There was indwelling 5-French sheath.  This was exchanged for a 6-French sheath.  Bivalirudin was given according to protocol and ACT was checked.  10 mg of oral Effient was also administered as the patient is on chronic Effient therapy.  A JR-4 guiding catheter with side holes was utilized.  We initially crossed with a Prowater wire and a 2.75 mm, 12 mm length balloon was placed across the lesion and predilatation performed.  Following this, we attempted to pass the Resolute Integrity 2.75 x 18 mm drug-eluting stent across the previously placed stent.  The stent would not move well through the proximal vessel due to calcification in the artery.  A BMW wire was then placed in the distal vessel that act as a buddy wire.  The stent was then easily  traversed into the area of in-stent restenosis. The stent was then dilated to approximately 14-15 atmospheres. Following this, postdilatation was then performed without complication. There was a good angiographic result.  There was no significant waste. There was a small side branch with lesser flow.  ANGIOGRAPHIC DATA:  The right coronary artery has a segmental area of plaque in the proximal vessel of about 30% to 40%, which was moderately calcified.  In the midvessel, overlying an RV branch is previously placed drug-eluting PROMUS stent.  There is 95% in-stent restenosis. The vessel opens up distally, and a previously placed non drug-eluting stent remains widely patent.  Following stenting, this is reduced to 0% residual luminal narrowing.  The final angiographic result demonstrated no significant high-grade residual stenosis.  The proximal vessel does demonstrate about 40% to 50% diffuse calcified plaque.  CONCLUSION:  Successful percutaneous stenting of in-stent restenosis at the previous site of drug-eluting stent implantation in the mid right coronary artery.  DISPOSITION:  The patient will be treated medically.  He will remain on aspirin and Effient.     Arturo Morton. Riley Kill, MD, [facility]     TDS/MEDQ  D:  07/27/2011  T:  07/28/2011  Job:  [id number]  cc:   Jonelle Sidle, MD Loistine Chance  Earnstine Regal, M.D. Selinda Flavin, MD  Electronically Signed by Shawnie Pons MD [facility] on 08/04/2011 06:09:10 PM

## 2011-08-19 ENCOUNTER — Encounter: Payer: Self-pay | Admitting: Cardiology

## 2011-08-19 ENCOUNTER — Ambulatory Visit (INDEPENDENT_AMBULATORY_CARE_PROVIDER_SITE_OTHER): Payer: PRIVATE HEALTH INSURANCE | Admitting: Cardiology

## 2011-08-19 VITALS — BP 106/75 | HR 76 | Resp 16 | Ht 70.0 in | Wt 273.0 lb

## 2011-08-19 DIAGNOSIS — I10 Essential (primary) hypertension: Secondary | ICD-10-CM

## 2011-08-19 DIAGNOSIS — E785 Hyperlipidemia, unspecified: Secondary | ICD-10-CM

## 2011-08-19 DIAGNOSIS — I2510 Atherosclerotic heart disease of native coronary artery without angina pectoris: Secondary | ICD-10-CM

## 2011-08-19 DIAGNOSIS — I739 Peripheral vascular disease, unspecified: Secondary | ICD-10-CM

## 2011-08-19 MED ORDER — ASPIRIN 325 MG PO TABS: 325.0000 mg | ORAL_TABLET | Freq: Every day | ORAL | Status: AC

## 2011-08-19 NOTE — Progress Notes (Signed)
The patient is a 55 year old male underwent recent stenting of in-stent restenosis of the prior drug-eluting stent in the midright coronary artery. Procedure was performed 07/27/2011. The patient had a bare-metal stent in right coronary artery in 2003 and subsequent stenting of the midright coronary artery with a drug-eluting stent in October 2011. The patient has multiple risk factors for coronary arteries including obesity, diabetes mellitus hypertension and dyslipidemia although the latter is well controlled with low-dose statin. He also has peripheral vascular disease with pain in the right lower extremity but the right ABI previously was 0.95 he also has sleep apnea and CPAP. The patient stated he has lost significant weight after his recent procedure. Is also able to control his blood sugar better. He has cut carbohydrates out of his diet and most part. He does report some right leg claudication. He reports no orthopnea PND palpitations or syncope  Allergies/family so social history are recorded in the chart  Review of systems: No nausea vomiting. No fever or chills. No melena hematochezia. No dysuria frequency. Cardiovascular review of systems is essentially within normal limits  Physical examination: Vital signs listed below Neck exam: Normal carotid upstroke no carotid bruits. No thyromegaly Throat is clear with normal dentition Lung exam is essentially within normal limits somewhat distant breath sounds Cardiac exam: Regular rate and rhythm with normal S1-S2 no pathological murmurs Abdomen soft nontender no rebound or guarding good bowel sounds Extremity exam no cyanosis clubbing or edema Vascular exam: Peripheral pulses are difficult to palpate Psychiatric: Normal affect  neurologic: No focal deficits

## 2011-08-19 NOTE — Patient Instructions (Signed)
Continue all current medications. Your physician wants you to follow up in: 6 months.  You will receive a reminder letter in the mail one-two months in advance.  If you don't receive a letter, please call our office to schedule the follow up appointment   

## 2011-08-19 NOTE — Assessment & Plan Note (Signed)
Blood pressure well controlled. Continue current medical therapy 

## 2011-08-19 NOTE — Assessment & Plan Note (Addendum)
No recurrent chest pain. Continue dual antiplatelet therapy for at least one year.

## 2011-08-19 NOTE — Assessment & Plan Note (Signed)
LDL well controlled on low-dose statin drug therapy

## 2011-08-19 NOTE — Assessment & Plan Note (Signed)
Currently not a candidate for a lap band surgery. Status post recent stent placement on dual antiplatelet therapy. Patient can consider surgery at least one year of dual antiplatelet therapy. In the meanwhile aggressive risk factor modification with weight loss, decrease carbohydrate intake and exercise.

## 2011-10-01 ENCOUNTER — Other Ambulatory Visit: Payer: Self-pay | Admitting: *Deleted

## 2011-10-01 MED ORDER — PRASUGREL HCL 10 MG PO TABS: 10.0000 mg | ORAL_TABLET | Freq: Every day | ORAL | Status: DC

## 2012-04-20 ENCOUNTER — Other Ambulatory Visit: Payer: Self-pay | Admitting: Cardiology

## 2012-07-14 ENCOUNTER — Other Ambulatory Visit: Payer: Self-pay | Admitting: Cardiology

## 2016-09-22 DIAGNOSIS — Z6836 Body mass index (BMI) 36.0-36.9, adult: Secondary | ICD-10-CM | POA: Diagnosis not present

## 2016-09-22 DIAGNOSIS — I10 Essential (primary) hypertension: Secondary | ICD-10-CM | POA: Diagnosis not present

## 2016-09-22 DIAGNOSIS — Z0000 Encounter for general adult medical examination without abnormal findings: Secondary | ICD-10-CM | POA: Diagnosis not present

## 2017-11-02 DIAGNOSIS — E1165 Type 2 diabetes mellitus with hyperglycemia: Secondary | ICD-10-CM | POA: Diagnosis not present

## 2017-11-02 DIAGNOSIS — I10 Essential (primary) hypertension: Secondary | ICD-10-CM | POA: Diagnosis not present

## 2017-11-02 DIAGNOSIS — G4733 Obstructive sleep apnea (adult) (pediatric): Secondary | ICD-10-CM | POA: Diagnosis not present

## 2017-11-02 DIAGNOSIS — D519 Vitamin B12 deficiency anemia, unspecified: Secondary | ICD-10-CM | POA: Diagnosis not present

## 2017-11-07 DIAGNOSIS — F5221 Male erectile disorder: Secondary | ICD-10-CM | POA: Diagnosis not present

## 2017-11-07 DIAGNOSIS — Z1389 Encounter for screening for other disorder: Secondary | ICD-10-CM | POA: Diagnosis not present

## 2017-11-07 DIAGNOSIS — G4733 Obstructive sleep apnea (adult) (pediatric): Secondary | ICD-10-CM | POA: Diagnosis not present

## 2017-11-07 DIAGNOSIS — Z23 Encounter for immunization: Secondary | ICD-10-CM | POA: Diagnosis not present

## 2017-11-07 DIAGNOSIS — E1165 Type 2 diabetes mellitus with hyperglycemia: Secondary | ICD-10-CM | POA: Diagnosis not present

## 2017-11-07 DIAGNOSIS — I10 Essential (primary) hypertension: Secondary | ICD-10-CM | POA: Diagnosis not present

## 2019-07-16 ENCOUNTER — Other Ambulatory Visit: Payer: Self-pay

## 2019-07-16 DIAGNOSIS — Z20822 Contact with and (suspected) exposure to covid-19: Secondary | ICD-10-CM

## 2019-07-17 LAB — NOVEL CORONAVIRUS, NAA: SARS-CoV-2, NAA: NOT DETECTED

## 2019-12-01 DIAGNOSIS — Z23 Encounter for immunization: Secondary | ICD-10-CM | POA: Diagnosis not present

## 2019-12-29 DIAGNOSIS — Z23 Encounter for immunization: Secondary | ICD-10-CM | POA: Diagnosis not present

## 2020-03-19 ENCOUNTER — Encounter: Payer: Self-pay | Admitting: Cardiovascular Disease

## 2020-03-19 DIAGNOSIS — G4700 Insomnia, unspecified: Secondary | ICD-10-CM | POA: Diagnosis not present

## 2020-03-19 DIAGNOSIS — I209 Angina pectoris, unspecified: Secondary | ICD-10-CM | POA: Diagnosis not present

## 2020-03-19 DIAGNOSIS — E1165 Type 2 diabetes mellitus with hyperglycemia: Secondary | ICD-10-CM | POA: Diagnosis not present

## 2020-03-19 DIAGNOSIS — I10 Essential (primary) hypertension: Secondary | ICD-10-CM | POA: Diagnosis not present

## 2020-03-20 ENCOUNTER — Telehealth: Payer: Self-pay | Admitting: Cardiovascular Disease

## 2020-03-20 NOTE — Telephone Encounter (Signed)
Dr. Dimas Aguas called requesting to speak with Dr. Purvis Sheffield in regards to patient [name] Gray. [name] is 64 years old, smoker,diabetic. States that for months now patient complains of chest pains when walking up steps. He takes Nitroglycerin which relieves the pain. Dr. Dimas Aguas states that patient was seeing Dr. Shaune Pascal. His main concern is stress test or Cath. Please call Dr. Dimas Aguas on his cell phone.

## 2020-03-20 NOTE — Telephone Encounter (Signed)
I will call Dr. Dimas Aguas.  Please add him onto my schedule as a new patient on 03/27/2020 at 10:20 AM.  Please get records from Dr. Dimas Aguas.

## 2020-03-21 ENCOUNTER — Encounter: Payer: Self-pay | Admitting: Cardiovascular Disease

## 2020-03-26 ENCOUNTER — Encounter: Payer: Self-pay | Admitting: *Deleted

## 2020-03-27 ENCOUNTER — Ambulatory Visit (INDEPENDENT_AMBULATORY_CARE_PROVIDER_SITE_OTHER): Payer: BC Managed Care – PPO | Admitting: Cardiovascular Disease

## 2020-03-27 ENCOUNTER — Other Ambulatory Visit: Payer: Self-pay

## 2020-03-27 ENCOUNTER — Encounter: Payer: Self-pay | Admitting: *Deleted

## 2020-03-27 ENCOUNTER — Telehealth: Payer: Self-pay | Admitting: Cardiovascular Disease

## 2020-03-27 ENCOUNTER — Encounter: Payer: Self-pay | Admitting: Cardiovascular Disease

## 2020-03-27 ENCOUNTER — Other Ambulatory Visit: Payer: Self-pay | Admitting: Cardiovascular Disease

## 2020-03-27 VITALS — BP 148/90 | HR 68 | Ht 70.0 in | Wt 230.2 lb

## 2020-03-27 DIAGNOSIS — I209 Angina pectoris, unspecified: Secondary | ICD-10-CM | POA: Diagnosis not present

## 2020-03-27 DIAGNOSIS — I10 Essential (primary) hypertension: Secondary | ICD-10-CM | POA: Diagnosis not present

## 2020-03-27 DIAGNOSIS — I25118 Atherosclerotic heart disease of native coronary artery with other forms of angina pectoris: Secondary | ICD-10-CM

## 2020-03-27 DIAGNOSIS — E119 Type 2 diabetes mellitus without complications: Secondary | ICD-10-CM

## 2020-03-27 DIAGNOSIS — Z720 Tobacco use: Secondary | ICD-10-CM

## 2020-03-27 DIAGNOSIS — Z794 Long term (current) use of insulin: Secondary | ICD-10-CM

## 2020-03-27 DIAGNOSIS — Z955 Presence of coronary angioplasty implant and graft: Secondary | ICD-10-CM

## 2020-03-27 DIAGNOSIS — E785 Hyperlipidemia, unspecified: Secondary | ICD-10-CM

## 2020-03-27 MED ORDER — ROSUVASTATIN CALCIUM 20 MG PO TABS: 20.0000 mg | ORAL_TABLET | Freq: Every day | ORAL | 6 refills | Status: DC

## 2020-03-27 MED ORDER — SODIUM CHLORIDE 0.9% FLUSH: 3.0000 mL | Freq: Two times a day (BID) | INTRAVENOUS | Status: DC

## 2020-03-27 NOTE — Progress Notes (Signed)
     CARDIOLOGY CONSULT NOTE  Patient ID: [name] Gray MRN: [id number] DOB/AGE: 01/14/1956 64 y.o.  Admit date: (Not on file) Primary Physician: Howard, Kevin, MD  Reason for Consultation: CAD  HPI: [name] Gray is a 64 y.o. male who is being seen today for the evaluation of CAD at the request of Howard, Kevin, MD.   He underwent stenting for in-stent restenosis in the mid RCA on 07/28/2011 by Dr. Stuckey.  This was for a vessel that had been previously stented on 2 occasions.  I was called by Dr. Howard last week because of concerns of anginal pain.  I personally reviewed an ECG performed on 03/19/2020 which demonstrated sinus rhythm with no ischemic ST segment or T wave abnormalities.  I reviewed labs dated 03/11/2020: BUN 10, creatinine 0.78, sodium 139, potassium 4.3, normal liver transaminases, hemoglobin 17.5, platelets 187, hemoglobin A1c 10.8%.  Lipids on 03/11/2020 showed total cholesterol 149, triglycerides 183, HDL 32, LDL 85.  TSH was 1.14.  He had quit smoking for several years but started smoking again about 2 years ago.  He smokes a pack and a half of cigarettes daily.  He said he has not been taking care of himself.  Over the past year he has begun to experience chest pain and pressure when walking up inclines or when physically exerting himself.  This has progressively gotten worse.  He sometimes has heartburn after eating and he said this where the exact same symptoms he had prior to most recent stent placement.  He denies worsening shortness of breath.  He denies palpitations, leg swelling, orthopnea, paroxysmal nocturnal dyspnea.  Social history: He used to own a trucking business.  He now works at the [employer] in [place].  He has known Dr. Kevin Howard for years and is his neighbor.   No Known Allergies  Current Outpatient Medications  Medication Sig Dispense Refill  . aspirin EC 81 MG tablet Take 81 mg by mouth daily.    .  lisinopril-hydrochlorothiazide (PRINZIDE,ZESTORETIC) 10-12.5 MG per tablet Take 1 tablet by mouth daily.      . metFORMIN (GLUCOPHAGE-XR) 500 MG 24 hr tablet Take 500 mg by mouth 2 (two) times daily.    . metoprolol succinate (TOPROL-XL) 25 MG 24 hr tablet Take 25 mg by mouth daily.      . nitroGLYCERIN (NITROSTAT) 0.4 MG SL tablet Place 0.4 mg under the tongue every 5 (five) minutes as needed for chest pain.    . simvastatin (ZOCOR) 10 MG tablet Take 10 mg by mouth at bedtime.    . VICTOZA 18 MG/3ML SOPN Inject 1.8 mg into the skin daily.     No current facility-administered medications for this visit.    Past Medical History:  Diagnosis Date  . Diabetes mellitus   . Hyperlipidemia   . Hypertension   . Presence of stent in coronary artery   . S/P insertion of iliac artery stent     No past surgical history on file.  Social History   Socioeconomic History  . Marital status: Married    Spouse name: Not on file  . Number of children: Not on file  . Years of education: Not on file  . Highest education level: Not on file  Occupational History  . Not on file  Tobacco Use  . Smoking status: Current Every Day Smoker    Types: Cigarettes  . Smokeless tobacco: Current User  Substance and Sexual Activity  . Alcohol   use: Yes  . Drug use: No  . Sexual activity: Not on file  Other Topics Concern  . Not on file  Social History Narrative  . Not on file   Social Determinants of Health   Financial Resource Strain:   . Difficulty of Paying Living Expenses:   Food Insecurity:   . Worried About Charity fundraiser in the Last Year:   . Arboriculturist in the Last Year:   Transportation Needs:   . Film/video editor (Medical):   Marland Kitchen Lack of Transportation (Non-Medical):   Physical Activity:   . Days of Exercise per Week:   . Minutes of Exercise per Session:   Stress:   . Feeling of Stress :   Social Connections:   . Frequency of Communication with Friends and Family:   .  Frequency of Social Gatherings with Friends and Family:   . Attends Religious Services:   . Active Member of Clubs or Organizations:   . Attends Archivist Meetings:   Marland Kitchen Marital Status:   Intimate Partner Violence:   . Fear of Current or Ex-Partner:   . Emotionally Abused:   Marland Kitchen Physically Abused:   . Sexually Abused:      No family history of premature CAD in 1st degree relatives.  Current Meds  Medication Sig  . aspirin EC 81 MG tablet Take 81 mg by mouth daily.  Marland Kitchen lisinopril-hydrochlorothiazide (PRINZIDE,ZESTORETIC) 10-12.5 MG per tablet Take 1 tablet by mouth daily.    . metFORMIN (GLUCOPHAGE-XR) 500 MG 24 hr tablet Take 500 mg by mouth 2 (two) times daily.  . metoprolol succinate (TOPROL-XL) 25 MG 24 hr tablet Take 25 mg by mouth daily.    . nitroGLYCERIN (NITROSTAT) 0.4 MG SL tablet Place 0.4 mg under the tongue every 5 (five) minutes as needed for chest pain.  . simvastatin (ZOCOR) 10 MG tablet Take 10 mg by mouth at bedtime.  Marland Kitchen VICTOZA 18 MG/3ML SOPN Inject 1.8 mg into the skin daily.      Review of systems complete and found to be negative unless listed above in HPI   Physical exam Blood pressure (!) 148/90, pulse 68, height 5\' 10"  (1.778 m), weight 230 lb 3.2 oz (104.4 kg), SpO2 98 %. General: NAD Neck: No JVD, no thyromegaly or thyroid nodule.  Lungs: Clear to auscultation bilaterally with normal respiratory effort. CV: Nondisplaced PMI. Regular rate and rhythm, normal S1/S2, no S3/S4, no murmur.  No peripheral edema.  No carotid bruit.    Abdomen: Soft, nontender, no distention.  Skin: Intact without lesions or rashes.  Neurologic: Alert and oriented x 3.  Psych: Normal affect. Extremities: No clubbing or cyanosis.  HEENT: Normal.   ECG: Most recent ECG reviewed.   Labs: Lab Results  Component Value Date/Time   K 3.7 07/28/2011 05:28 AM   BUN 11 07/28/2011 05:28 AM   CREATININE 0.68 07/28/2011 05:28 AM   ALT 29 07/26/2011 05:40 PM   HGB 15.1  07/28/2011 05:28 AM     Lipids: Lab Results  Component Value Date/Time   LDLCALC 58 07/27/2011 05:00 AM   CHOL 140 07/27/2011 05:00 AM   TRIG 271 (H) 07/27/2011 05:00 AM   HDL 28 (L) 07/27/2011 05:00 AM        ASSESSMENT AND PLAN:   1.  Coronary artery disease/chest pain: He underwent stenting for in-stent restenosis in the mid RCA on 07/28/2011 by Dr. Lia Foyer.  This was for a vessel that had been previously  stented on 2 occasions.  He is complaining of progressive anginal pain over the last year.  He has also been smoking a pack and a half of cigarettes daily and has poorly controlled diabetes, all raising the likelihood for progressive obstructive coronary artery disease.  Currently on aspirin, statin, and beta-blocker.  I will switch simvastatin to rosuvastatin 20 mg daily. I will arrange for cardiac catheterization/coronary angiography. Risks and benefits of cardiac catheterization have been discussed with the patient.  These include bleeding, infection, kidney damage, stroke, heart attack, death.  The patient understands these risks and is willing to proceed.  2.  Hypertension: BP is elevated.  This will need further monitoring.  Currently on Toprol-XL.  3.  Hyperlipidemia: Lipids reviewed above.  I will switch simvastatin to rosuvastatin 20 mg daily.  Goal LDL less than 70, currently 85.  4.  Type 2 diabetes mellitus: Diabetes is poorly controlled with an A1c of 10.8%.  He is on Metformin and Victoza now.  He said his symptoms have improved.  5.  Tobacco use: He is smoking a pack and half cigarettes daily.  We talked about the need for cessation.    Disposition: Follow up with me in July 2021    Signed: Prentice Docker, M.D., F.A.C.C.  03/27/2020, 10:33 AM

## 2020-03-27 NOTE — Telephone Encounter (Signed)
  Precert needed for:   left heart cath - angina pectoris - 5/13 - Eldridge Dace

## 2020-03-27 NOTE — H&P (View-Only) (Signed)
CARDIOLOGY CONSULT NOTE  Patient ID: [name] Gray MRN: [id number] DOB/AGE: [id number] 62 y.o.  Admit date: (Not on file) Primary Physician: Selinda Flavin, MD  Reason for Consultation: CAD  HPI: [name] Gray is a 64 y.o. male who is being seen today for the evaluation of CAD at the request of Selinda Flavin, MD.   He underwent stenting for in-stent restenosis in the mid RCA on 07/28/2011 by Dr. Riley Kill.  This was for a vessel that had been previously stented on 2 occasions.  I was called by Dr. Dimas Aguas last week because of concerns of anginal pain.  I personally reviewed an ECG performed on 03/19/2020 which demonstrated sinus rhythm with no ischemic ST segment or T wave abnormalities.  I reviewed labs dated 03/11/2020: BUN 10, creatinine 0.78, sodium 139, potassium 4.3, normal liver transaminases, hemoglobin 17.5, platelets 187, hemoglobin A1c 10.8%.  Lipids on 03/11/2020 showed total cholesterol 149, triglycerides 183, HDL 32, LDL 85.  TSH was 1.14.  He had quit smoking for several years but started smoking again about 2 years ago.  He smokes a pack and a half of cigarettes daily.  He said he has not been taking care of himself.  Over the past year he has begun to experience chest pain and pressure when walking up inclines or when physically exerting himself.  This has progressively gotten worse.  He sometimes has heartburn after eating and he said this where the exact same symptoms he had prior to most recent stent placement.  He denies worsening shortness of breath.  He denies palpitations, leg swelling, orthopnea, paroxysmal nocturnal dyspnea.  Social history: He used to own a trucking business.  He now works at the [employer] in [place].  He has known Dr. Selinda Flavin for years and is his neighbor.   No Known Allergies  Current Outpatient Medications  Medication Sig Dispense Refill  . aspirin EC 81 MG tablet Take 81 mg by mouth daily.    Marland Kitchen  lisinopril-hydrochlorothiazide (PRINZIDE,ZESTORETIC) 10-12.5 MG per tablet Take 1 tablet by mouth daily.      . metFORMIN (GLUCOPHAGE-XR) 500 MG 24 hr tablet Take 500 mg by mouth 2 (two) times daily.    . metoprolol succinate (TOPROL-XL) 25 MG 24 hr tablet Take 25 mg by mouth daily.      . nitroGLYCERIN (NITROSTAT) 0.4 MG SL tablet Place 0.4 mg under the tongue every 5 (five) minutes as needed for chest pain.    . simvastatin (ZOCOR) 10 MG tablet Take 10 mg by mouth at bedtime.    Marland Kitchen VICTOZA 18 MG/3ML SOPN Inject 1.8 mg into the skin daily.     No current facility-administered medications for this visit.    Past Medical History:  Diagnosis Date  . Diabetes mellitus   . Hyperlipidemia   . Hypertension   . Presence of stent in coronary artery   . S/P insertion of iliac artery stent     No past surgical history on file.  Social History   Socioeconomic History  . Marital status: Married    Spouse name: Not on file  . Number of children: Not on file  . Years of education: Not on file  . Highest education level: Not on file  Occupational History  . Not on file  Tobacco Use  . Smoking status: Current Every Day Smoker    Types: Cigarettes  . Smokeless tobacco: Current User  Substance and Sexual Activity  . Alcohol  use: Yes  . Drug use: No  . Sexual activity: Not on file  Other Topics Concern  . Not on file  Social History Narrative  . Not on file   Social Determinants of Health   Financial Resource Strain:   . Difficulty of Paying Living Expenses:   Food Insecurity:   . Worried About Charity fundraiser in the Last Year:   . Arboriculturist in the Last Year:   Transportation Needs:   . Film/video editor (Medical):   Marland Kitchen Lack of Transportation (Non-Medical):   Physical Activity:   . Days of Exercise per Week:   . Minutes of Exercise per Session:   Stress:   . Feeling of Stress :   Social Connections:   . Frequency of Communication with Friends and Family:   .  Frequency of Social Gatherings with Friends and Family:   . Attends Religious Services:   . Active Member of Clubs or Organizations:   . Attends Archivist Meetings:   Marland Kitchen Marital Status:   Intimate Partner Violence:   . Fear of Current or Ex-Partner:   . Emotionally Abused:   Marland Kitchen Physically Abused:   . Sexually Abused:      No family history of premature CAD in 1st degree relatives.  Current Meds  Medication Sig  . aspirin EC 81 MG tablet Take 81 mg by mouth daily.  Marland Kitchen lisinopril-hydrochlorothiazide (PRINZIDE,ZESTORETIC) 10-12.5 MG per tablet Take 1 tablet by mouth daily.    . metFORMIN (GLUCOPHAGE-XR) 500 MG 24 hr tablet Take 500 mg by mouth 2 (two) times daily.  . metoprolol succinate (TOPROL-XL) 25 MG 24 hr tablet Take 25 mg by mouth daily.    . nitroGLYCERIN (NITROSTAT) 0.4 MG SL tablet Place 0.4 mg under the tongue every 5 (five) minutes as needed for chest pain.  . simvastatin (ZOCOR) 10 MG tablet Take 10 mg by mouth at bedtime.  Marland Kitchen VICTOZA 18 MG/3ML SOPN Inject 1.8 mg into the skin daily.      Review of systems complete and found to be negative unless listed above in HPI   Physical exam Blood pressure (!) 148/90, pulse 68, height 5\' 10"  (1.778 m), weight 230 lb 3.2 oz (104.4 kg), SpO2 98 %. General: NAD Neck: No JVD, no thyromegaly or thyroid nodule.  Lungs: Clear to auscultation bilaterally with normal respiratory effort. CV: Nondisplaced PMI. Regular rate and rhythm, normal S1/S2, no S3/S4, no murmur.  No peripheral edema.  No carotid bruit.    Abdomen: Soft, nontender, no distention.  Skin: Intact without lesions or rashes.  Neurologic: Alert and oriented x 3.  Psych: Normal affect. Extremities: No clubbing or cyanosis.  HEENT: Normal.   ECG: Most recent ECG reviewed.   Labs: Lab Results  Component Value Date/Time   K 3.7 07/28/2011 05:28 AM   BUN 11 07/28/2011 05:28 AM   CREATININE 0.68 07/28/2011 05:28 AM   ALT 29 07/26/2011 05:40 PM   HGB 15.1  07/28/2011 05:28 AM     Lipids: Lab Results  Component Value Date/Time   LDLCALC 58 07/27/2011 05:00 AM   CHOL 140 07/27/2011 05:00 AM   TRIG 271 (H) 07/27/2011 05:00 AM   HDL 28 (L) 07/27/2011 05:00 AM        ASSESSMENT AND PLAN:   1.  Coronary artery disease/chest pain: He underwent stenting for in-stent restenosis in the mid RCA on 07/28/2011 by Dr. Lia Foyer.  This was for a vessel that had been previously  stented on 2 occasions.  He is complaining of progressive anginal pain over the last year.  He has also been smoking a pack and a half of cigarettes daily and has poorly controlled diabetes, all raising the likelihood for progressive obstructive coronary artery disease.  Currently on aspirin, statin, and beta-blocker.  I will switch simvastatin to rosuvastatin 20 mg daily. I will arrange for cardiac catheterization/coronary angiography. Risks and benefits of cardiac catheterization have been discussed with the patient.  These include bleeding, infection, kidney damage, stroke, heart attack, death.  The patient understands these risks and is willing to proceed.  2.  Hypertension: BP is elevated.  This will need further monitoring.  Currently on Toprol-XL.  3.  Hyperlipidemia: Lipids reviewed above.  I will switch simvastatin to rosuvastatin 20 mg daily.  Goal LDL less than 70, currently 85.  4.  Type 2 diabetes mellitus: Diabetes is poorly controlled with an A1c of 10.8%.  He is on Metformin and Victoza now.  He said his symptoms have improved.  5.  Tobacco use: He is smoking a pack and half cigarettes daily.  We talked about the need for cessation.    Disposition: Follow up with me in July 2021    Signed: Prentice Docker, M.D., F.A.C.C.  03/27/2020, 10:33 AM

## 2020-03-27 NOTE — Patient Instructions (Signed)
Medication Instructions:   Stop Simvastatin.   Begin Crestor 20mg  daily.   Continue all other medications.    Labwork: none  Testing/Procedures: Your physician has requested that you have a cardiac catheterization. Cardiac catheterization is used to diagnose and/or treat various heart conditions. Doctors may recommend this procedure for a number of different reasons. The most common reason is to evaluate chest pain. Chest pain can be a symptom of coronary artery disease (CAD), and cardiac catheterization can show whether plaque is narrowing or blocking your heart's arteries. This procedure is also used to evaluate the valves, as well as measure the blood flow and oxygen levels in different parts of your heart. For further information please visit . Please follow instruction sheet, as given.  Follow-Up: July   Any Other Special Instructions Will Be Listed Below (If Applicable).  If you need a refill on your cardiac medications before your next appointment, please call your pharmacy.

## 2020-03-31 ENCOUNTER — Other Ambulatory Visit: Payer: Self-pay

## 2020-03-31 ENCOUNTER — Other Ambulatory Visit (HOSPITAL_COMMUNITY): Admission: RE | Admit: 2020-03-31 | Payer: BC Managed Care – PPO | Source: Ambulatory Visit

## 2020-03-31 ENCOUNTER — Other Ambulatory Visit (HOSPITAL_COMMUNITY)
Admission: RE | Admit: 2020-03-31 | Discharge: 2020-03-31 | Disposition: A | Discharge status: Home / Self Care | Payer: BC Managed Care – PPO | Source: Ambulatory Visit | Attending: Interventional Cardiology | Admitting: Interventional Cardiology

## 2020-03-31 DIAGNOSIS — Z20822 Contact with and (suspected) exposure to covid-19: Secondary | ICD-10-CM | POA: Insufficient documentation

## 2020-03-31 DIAGNOSIS — Z01812 Encounter for preprocedural laboratory examination: Secondary | ICD-10-CM | POA: Diagnosis not present

## 2020-04-01 ENCOUNTER — Encounter: Payer: Self-pay | Admitting: *Deleted

## 2020-04-01 ENCOUNTER — Telehealth: Payer: Self-pay | Admitting: *Deleted

## 2020-04-01 LAB — SARS CORONAVIRUS 2 (TAT 6-24 HRS): SARS Coronavirus 2: NEGATIVE

## 2020-04-01 NOTE — Telephone Encounter (Signed)
Pt contacted pre-catheterization scheduled at [facility] for: Thursday Apr 03, 2020 7:30 AM Verified arrival time and place: [facility] Main Entrance A [facility]) at: 5:30 AM   No solid food after midnight prior to cath, clear liquids until 5 AM day of procedure.  Hold: Lisinopril-HCT-AM of procedure. Metformin-day of procedure and 48 hours post procedure   Except hold medications AM meds can be  taken pre-cath with sip of water including: ASA 81 mg   Confirmed patient has responsible adult to drive home post procedure and observe 24 hours after arriving home:  yes  You are allowed ONE visitor in the waiting room during your procedure. Both you and your visitor must wear masks.     COVID-19 Pre-Screening Questions:  . In the past 7 to 10 days have you had a cough,  shortness of breath, headache, congestion, fever (100 or greater) body aches, chills, sore throat, or sudden loss of taste or sense of smell? no . Have you been around anyone with known Covid 19 in the past 7 to 10 days? no . Have you been around anyone who is awaiting Covid 19 test results in the past 7 to 10 days? no . Have you been around anyone who has mentioned symptoms of Covid 19 within the past 7 to 10 days? no  Reviewed procedure/mask/visitor instructions,COVID-19 screening questions with patient.

## 2020-04-03 ENCOUNTER — Encounter (HOSPITAL_COMMUNITY)
Admission: RE | Disposition: A | Discharge status: Home / Self Care | Payer: Self-pay | Source: Home / Self Care | Attending: Interventional Cardiology

## 2020-04-03 ENCOUNTER — Other Ambulatory Visit: Payer: Self-pay

## 2020-04-03 ENCOUNTER — Ambulatory Visit (HOSPITAL_COMMUNITY)
Admission: RE | Admit: 2020-04-03 | Discharge: 2020-04-05 | Disposition: A | Discharge status: Home / Self Care | Payer: BC Managed Care – PPO | Attending: Interventional Cardiology | Admitting: Interventional Cardiology

## 2020-04-03 DIAGNOSIS — I2584 Coronary atherosclerosis due to calcified coronary lesion: Secondary | ICD-10-CM | POA: Insufficient documentation

## 2020-04-03 DIAGNOSIS — I209 Angina pectoris, unspecified: Secondary | ICD-10-CM | POA: Diagnosis not present

## 2020-04-03 DIAGNOSIS — Z7982 Long term (current) use of aspirin: Secondary | ICD-10-CM | POA: Diagnosis not present

## 2020-04-03 DIAGNOSIS — Z6831 Body mass index (BMI) 31.0-31.9, adult: Secondary | ICD-10-CM | POA: Insufficient documentation

## 2020-04-03 DIAGNOSIS — Z955 Presence of coronary angioplasty implant and graft: Secondary | ICD-10-CM | POA: Insufficient documentation

## 2020-04-03 DIAGNOSIS — I2510 Atherosclerotic heart disease of native coronary artery without angina pectoris: Secondary | ICD-10-CM | POA: Diagnosis present

## 2020-04-03 DIAGNOSIS — I200 Unstable angina: Secondary | ICD-10-CM | POA: Diagnosis present

## 2020-04-03 DIAGNOSIS — E1165 Type 2 diabetes mellitus with hyperglycemia: Secondary | ICD-10-CM | POA: Diagnosis not present

## 2020-04-03 DIAGNOSIS — E1151 Type 2 diabetes mellitus with diabetic peripheral angiopathy without gangrene: Secondary | ICD-10-CM | POA: Insufficient documentation

## 2020-04-03 DIAGNOSIS — I25119 Atherosclerotic heart disease of native coronary artery with unspecified angina pectoris: Secondary | ICD-10-CM | POA: Insufficient documentation

## 2020-04-03 DIAGNOSIS — E785 Hyperlipidemia, unspecified: Secondary | ICD-10-CM | POA: Diagnosis not present

## 2020-04-03 DIAGNOSIS — I10 Essential (primary) hypertension: Secondary | ICD-10-CM | POA: Diagnosis present

## 2020-04-03 DIAGNOSIS — F17210 Nicotine dependence, cigarettes, uncomplicated: Secondary | ICD-10-CM | POA: Diagnosis not present

## 2020-04-03 DIAGNOSIS — I739 Peripheral vascular disease, unspecified: Secondary | ICD-10-CM | POA: Diagnosis present

## 2020-04-03 DIAGNOSIS — Z79899 Other long term (current) drug therapy: Secondary | ICD-10-CM | POA: Diagnosis not present

## 2020-04-03 DIAGNOSIS — IMO0002 Reserved for concepts with insufficient information to code with codable children: Secondary | ICD-10-CM | POA: Diagnosis present

## 2020-04-03 HISTORY — PX: CORONARY STENT INTERVENTION: CATH118234

## 2020-04-03 HISTORY — PX: INTRAVASCULAR ULTRASOUND/IVUS: CATH118244

## 2020-04-03 HISTORY — PX: LEFT HEART CATH AND CORONARY ANGIOGRAPHY: CATH118249

## 2020-04-03 LAB — POCT ACTIVATED CLOTTING TIME
Activated Clotting Time: 290 seconds
Activated Clotting Time: 312 seconds

## 2020-04-03 LAB — GLUCOSE, CAPILLARY
Glucose-Capillary: 284 mg/dL — ABNORMAL HIGH (ref 70–99)
Glucose-Capillary: 288 mg/dL — ABNORMAL HIGH (ref 70–99)
Glucose-Capillary: 313 mg/dL — ABNORMAL HIGH (ref 70–99)
Glucose-Capillary: 322 mg/dL — ABNORMAL HIGH (ref 70–99)

## 2020-04-03 LAB — HEMOGLOBIN A1C
Hgb A1c MFr Bld: 10.9 % — ABNORMAL HIGH (ref 4.8–5.6)
Mean Plasma Glucose: 266.13 mg/dL

## 2020-04-03 SURGERY — LEFT HEART CATH AND CORONARY ANGIOGRAPHY
Anesthesia: LOCAL

## 2020-04-03 MED ORDER — NITROGLYCERIN 1 MG/10 ML FOR IR/CATH LAB
INTRA_ARTERIAL | Status: DC | PRN
  Administered 2020-04-03: 200 ug via INTRACORONARY
  Administered 2020-04-03: 400 ug via INTRA_ARTERIAL

## 2020-04-03 MED ORDER — FAMOTIDINE IN NACL 20-0.9 MG/50ML-% IV SOLN
INTRAVENOUS | Status: AC | PRN
  Administered 2020-04-03: 20 mg via INTRAVENOUS

## 2020-04-03 MED ORDER — ROSUVASTATIN CALCIUM 20 MG PO TABS
20.0000 mg | ORAL_TABLET | Freq: Every day | ORAL | Status: DC
  Administered 2020-04-04 – 2020-04-05 (×2): 20 mg via ORAL
  Filled 2020-04-03 (×3): qty 1

## 2020-04-03 MED ORDER — HYDROCHLOROTHIAZIDE 12.5 MG PO CAPS
12.5000 mg | ORAL_CAPSULE | Freq: Every day | ORAL | Status: DC
  Administered 2020-04-03 – 2020-04-05 (×2): 12.5 mg via ORAL
  Filled 2020-04-03 (×2): qty 1

## 2020-04-03 MED ORDER — METOPROLOL SUCCINATE ER 25 MG PO TB24
25.0000 mg | ORAL_TABLET | Freq: Every day | ORAL | Status: DC
  Administered 2020-04-04 – 2020-04-05 (×2): 25 mg via ORAL
  Filled 2020-04-03 (×3): qty 1

## 2020-04-03 MED ORDER — LISINOPRIL 10 MG PO TABS
10.0000 mg | ORAL_TABLET | Freq: Every day | ORAL | Status: DC
  Administered 2020-04-03 – 2020-04-05 (×3): 10 mg via ORAL
  Filled 2020-04-03 (×3): qty 1

## 2020-04-03 MED ORDER — MIDAZOLAM HCL 2 MG/2ML IJ SOLN
INTRAMUSCULAR | Status: DC | PRN
  Administered 2020-04-03: 1 mg via INTRAVENOUS
  Administered 2020-04-03: 2 mg via INTRAVENOUS
  Administered 2020-04-03: 1 mg via INTRAVENOUS

## 2020-04-03 MED ORDER — SODIUM CHLORIDE 0.9 % WEIGHT BASED INFUSION: 1.0000 mL/kg/h | INTRAVENOUS | Status: DC

## 2020-04-03 MED ORDER — HEPARIN (PORCINE) IN NACL 1000-0.9 UT/500ML-% IV SOLN
INTRAVENOUS | Status: DC | PRN
  Administered 2020-04-03 (×2): 500 mL

## 2020-04-03 MED ORDER — SODIUM CHLORIDE 0.9 % IV SOLN: 250.0000 mL | INTRAVENOUS | Status: DC | PRN

## 2020-04-03 MED ORDER — SODIUM CHLORIDE 0.9 % WEIGHT BASED INFUSION
3.0000 mL/kg/h | INTRAVENOUS | Status: DC
  Administered 2020-04-03: 3 mL/kg/h via INTRAVENOUS

## 2020-04-03 MED ORDER — INSULIN ASPART 100 UNIT/ML ~~LOC~~ SOLN
0.0000 [IU] | Freq: Three times a day (TID) | SUBCUTANEOUS | Status: DC
  Administered 2020-04-03 – 2020-04-04 (×2): 8 [IU] via SUBCUTANEOUS
  Administered 2020-04-04: 11 [IU] via SUBCUTANEOUS
  Administered 2020-04-04 – 2020-04-05 (×3): 5 [IU] via SUBCUTANEOUS

## 2020-04-03 MED ORDER — ALUM & MAG HYDROXIDE-SIMETH 200-200-20 MG/5ML PO SUSP
30.0000 mL | ORAL | Status: DC | PRN
  Administered 2020-04-03 – 2020-04-04 (×2): 30 mL via ORAL
  Filled 2020-04-03 (×2): qty 30

## 2020-04-03 MED ORDER — SEMAGLUTIDE(0.25 OR 0.5MG/DOS) 2 MG/1.5ML ~~LOC~~ SOPN: 0.5000 mg | PEN_INJECTOR | SUBCUTANEOUS | Status: DC

## 2020-04-03 MED ORDER — FAMOTIDINE 20 MG PO TABS
20.0000 mg | ORAL_TABLET | Freq: Once | ORAL | Status: AC
  Administered 2020-04-03: 20 mg via ORAL
  Filled 2020-04-03: qty 1

## 2020-04-03 MED ORDER — HEPARIN SODIUM (PORCINE) 1000 UNIT/ML IJ SOLN
INTRAMUSCULAR | Status: AC
  Filled 2020-04-03: qty 1

## 2020-04-03 MED ORDER — FENTANYL CITRATE (PF) 100 MCG/2ML IJ SOLN
INTRAMUSCULAR | Status: AC
  Filled 2020-04-03: qty 2

## 2020-04-03 MED ORDER — MIDAZOLAM HCL 2 MG/2ML IJ SOLN
INTRAMUSCULAR | Status: AC
  Filled 2020-04-03: qty 2

## 2020-04-03 MED ORDER — NITROGLYCERIN 0.4 MG SL SUBL: 0.4000 mg | SUBLINGUAL_TABLET | SUBLINGUAL | Status: DC | PRN

## 2020-04-03 MED ORDER — LABETALOL HCL 5 MG/ML IV SOLN: 10.0000 mg | INTRAVENOUS | Status: AC | PRN

## 2020-04-03 MED ORDER — CLOPIDOGREL BISULFATE 300 MG PO TABS
ORAL_TABLET | ORAL | Status: AC
  Filled 2020-04-03: qty 1

## 2020-04-03 MED ORDER — LIDOCAINE HCL (PF) 1 % IJ SOLN
INTRAMUSCULAR | Status: AC
  Filled 2020-04-03: qty 30

## 2020-04-03 MED ORDER — SODIUM CHLORIDE 0.9 % IV SOLN: INTRAVENOUS | Status: AC

## 2020-04-03 MED ORDER — CLOPIDOGREL BISULFATE 300 MG PO TABS
ORAL_TABLET | ORAL | Status: DC | PRN
  Administered 2020-04-03: 600 mg via ORAL

## 2020-04-03 MED ORDER — CLOPIDOGREL BISULFATE 75 MG PO TABS
75.0000 mg | ORAL_TABLET | Freq: Every day | ORAL | Status: DC
  Administered 2020-04-04: 75 mg via ORAL
  Filled 2020-04-03: qty 1

## 2020-04-03 MED ORDER — LISINOPRIL-HYDROCHLOROTHIAZIDE 10-12.5 MG PO TABS: 1.0000 | ORAL_TABLET | Freq: Every day | ORAL | Status: DC

## 2020-04-03 MED ORDER — SODIUM CHLORIDE 0.9% FLUSH
3.0000 mL | Freq: Two times a day (BID) | INTRAVENOUS | Status: DC
  Administered 2020-04-03 – 2020-04-05 (×3): 3 mL via INTRAVENOUS

## 2020-04-03 MED ORDER — MELATONIN 3 MG PO TABS
3.0000 mg | ORAL_TABLET | Freq: Every day | ORAL | Status: DC
  Administered 2020-04-03 – 2020-04-04 (×2): 3 mg via ORAL
  Filled 2020-04-03 (×2): qty 1

## 2020-04-03 MED ORDER — SODIUM CHLORIDE 0.9% FLUSH: 3.0000 mL | INTRAVENOUS | Status: DC | PRN

## 2020-04-03 MED ORDER — FENTANYL CITRATE (PF) 100 MCG/2ML IJ SOLN
INTRAMUSCULAR | Status: DC | PRN
  Administered 2020-04-03 (×3): 25 ug via INTRAVENOUS

## 2020-04-03 MED ORDER — NITROGLYCERIN 1 MG/10 ML FOR IR/CATH LAB
INTRA_ARTERIAL | Status: AC
  Filled 2020-04-03: qty 10

## 2020-04-03 MED ORDER — IOHEXOL 350 MG/ML SOLN
INTRAVENOUS | Status: DC | PRN
  Administered 2020-04-03: 140 mL

## 2020-04-03 MED ORDER — FAMOTIDINE IN NACL 20-0.9 MG/50ML-% IV SOLN
INTRAVENOUS | Status: AC
  Filled 2020-04-03: qty 50

## 2020-04-03 MED ORDER — ONDANSETRON HCL 4 MG/2ML IJ SOLN: 4.0000 mg | Freq: Four times a day (QID) | INTRAMUSCULAR | Status: DC | PRN

## 2020-04-03 MED ORDER — VERAPAMIL HCL 2.5 MG/ML IV SOLN
INTRAVENOUS | Status: AC
  Filled 2020-04-03: qty 2

## 2020-04-03 MED ORDER — ASPIRIN EC 81 MG PO TBEC: 81.0000 mg | DELAYED_RELEASE_TABLET | Freq: Every day | ORAL | Status: DC

## 2020-04-03 MED ORDER — ACETAMINOPHEN 325 MG PO TABS: 650.0000 mg | ORAL_TABLET | ORAL | Status: DC | PRN

## 2020-04-03 MED ORDER — PANTOPRAZOLE SODIUM 40 MG PO TBEC
40.0000 mg | DELAYED_RELEASE_TABLET | Freq: Every day | ORAL | Status: DC
  Administered 2020-04-03 – 2020-04-05 (×3): 40 mg via ORAL
  Filled 2020-04-03 (×3): qty 1

## 2020-04-03 MED ORDER — HEPARIN (PORCINE) IN NACL 1000-0.9 UT/500ML-% IV SOLN
INTRAVENOUS | Status: AC
  Filled 2020-04-03: qty 1000

## 2020-04-03 MED ORDER — VERAPAMIL HCL 2.5 MG/ML IV SOLN
INTRAVENOUS | Status: DC | PRN
  Administered 2020-04-03: 10 mL via INTRA_ARTERIAL

## 2020-04-03 MED ORDER — ASPIRIN 81 MG PO CHEW
81.0000 mg | CHEWABLE_TABLET | Freq: Every day | ORAL | Status: DC
  Administered 2020-04-04: 81 mg via ORAL
  Filled 2020-04-03: qty 1

## 2020-04-03 MED ORDER — HYDRALAZINE HCL 20 MG/ML IJ SOLN: 10.0000 mg | INTRAMUSCULAR | Status: AC | PRN

## 2020-04-03 MED ORDER — HEPARIN SODIUM (PORCINE) 1000 UNIT/ML IJ SOLN
INTRAMUSCULAR | Status: DC | PRN
  Administered 2020-04-03: 2000 [IU] via INTRAVENOUS
  Administered 2020-04-03: 6000 [IU] via INTRAVENOUS
  Administered 2020-04-03: 5000 [IU] via INTRAVENOUS

## 2020-04-03 MED ORDER — ASPIRIN 81 MG PO CHEW: 81.0000 mg | CHEWABLE_TABLET | ORAL | Status: DC

## 2020-04-03 MED ORDER — LIDOCAINE HCL (PF) 1 % IJ SOLN
INTRAMUSCULAR | Status: DC | PRN
  Administered 2020-04-03: 2 mL

## 2020-04-03 SURGICAL SUPPLY — 24 items
BALLN SAPPHIRE 2.5X15 (BALLOONS) ×2
BALLN SAPPHIRE ~~LOC~~ 3.75X15 (BALLOONS) ×1 IMPLANT
BALLN WOLVERINE 3.50X10 (BALLOONS) ×2
BALLOON SAPPHIRE 2.5X15 (BALLOONS) IMPLANT
BALLOON WOLVERINE 3.50X10 (BALLOONS) IMPLANT
CATH 5FR JL3.5 JR4 ANG PIG MP (CATHETERS) ×1 IMPLANT
CATH LAUNCHER 6FR JR4 (CATHETERS) ×1 IMPLANT
CATH OPTICROSS HD (CATHETERS) ×1 IMPLANT
DEVICE RAD COMP TR BAND LRG (VASCULAR PRODUCTS) ×1 IMPLANT
GLIDESHEATH SLEND SS 6F .021 (SHEATH) ×1 IMPLANT
GUIDEWIRE INQWIRE 1.5J.035X260 (WIRE) IMPLANT
INQWIRE 1.5J .035X260CM (WIRE) ×2
KIT ENCORE 26 ADVANTAGE (KITS) ×1 IMPLANT
KIT HEART LEFT (KITS) ×2 IMPLANT
KIT HEMO VALVE WATCHDOG (MISCELLANEOUS) ×1 IMPLANT
PACK CARDIAC CATHETERIZATION (CUSTOM PROCEDURE TRAY) ×2 IMPLANT
PROTECTION STATION PRESSURIZED (MISCELLANEOUS) ×2
SLED PULL BACK IVUS (MISCELLANEOUS) ×1 IMPLANT
STATION PROTECTION PRESSURIZED (MISCELLANEOUS) IMPLANT
STENT SYNERGY XD 3.50X20 (Permanent Stent) IMPLANT
SYNERGY XD 3.50X20 (Permanent Stent) ×2 IMPLANT
TRANSDUCER W/STOPCOCK (MISCELLANEOUS) ×2 IMPLANT
TUBING CIL FLEX 10 FLL-RA (TUBING) ×2 IMPLANT
WIRE ASAHI PROWATER 180CM (WIRE) ×1 IMPLANT

## 2020-04-03 NOTE — Interval H&P Note (Signed)
Cath Lab Visit (complete for each Cath Lab visit)  Clinical Evaluation Leading to the Procedure:   ACS: No.  Non-ACS:    Anginal Classification: CCS III  Anti-ischemic medical therapy: Minimal Therapy (1 class of medications)  Non-Invasive Test Results: No non-invasive testing performed  Prior CABG: No previous CABG  Multiple prior stents to the RCA.    History and Physical Interval Note:  04/03/2020 7:34 AM  [name] Gray  has presented today for surgery, with the diagnosis of angina.  The various methods of treatment have been discussed with the patient and family. After consideration of risks, benefits and other options for treatment, the patient has consented to  Procedure(s): LEFT HEART CATH AND CORONARY ANGIOGRAPHY (N/A) as a surgical intervention.  The patient's history has been reviewed, patient examined, no change in status, stable for surgery.  I have reviewed the patient's chart and labs.  Questions were answered to the patient's satisfaction.     Lance Muss

## 2020-04-03 NOTE — Progress Notes (Signed)
Pt walked off unit and was found in parking lot. Security called and was brought back to unit. Tammy Sours, RN made aware.

## 2020-04-04 ENCOUNTER — Ambulatory Visit (HOSPITAL_COMMUNITY)
Admission: RE | Disposition: A | Discharge status: Home / Self Care | Payer: Self-pay | Source: Home / Self Care | Attending: Interventional Cardiology

## 2020-04-04 DIAGNOSIS — I2584 Coronary atherosclerosis due to calcified coronary lesion: Secondary | ICD-10-CM | POA: Diagnosis not present

## 2020-04-04 DIAGNOSIS — Z7982 Long term (current) use of aspirin: Secondary | ICD-10-CM | POA: Diagnosis not present

## 2020-04-04 DIAGNOSIS — Z79899 Other long term (current) drug therapy: Secondary | ICD-10-CM | POA: Diagnosis not present

## 2020-04-04 DIAGNOSIS — I10 Essential (primary) hypertension: Secondary | ICD-10-CM | POA: Diagnosis not present

## 2020-04-04 DIAGNOSIS — Z6831 Body mass index (BMI) 31.0-31.9, adult: Secondary | ICD-10-CM | POA: Diagnosis not present

## 2020-04-04 DIAGNOSIS — I209 Angina pectoris, unspecified: Secondary | ICD-10-CM

## 2020-04-04 DIAGNOSIS — E1165 Type 2 diabetes mellitus with hyperglycemia: Secondary | ICD-10-CM | POA: Diagnosis not present

## 2020-04-04 DIAGNOSIS — Z955 Presence of coronary angioplasty implant and graft: Secondary | ICD-10-CM | POA: Diagnosis not present

## 2020-04-04 DIAGNOSIS — E785 Hyperlipidemia, unspecified: Secondary | ICD-10-CM | POA: Diagnosis not present

## 2020-04-04 DIAGNOSIS — E1151 Type 2 diabetes mellitus with diabetic peripheral angiopathy without gangrene: Secondary | ICD-10-CM | POA: Diagnosis not present

## 2020-04-04 DIAGNOSIS — I25119 Atherosclerotic heart disease of native coronary artery with unspecified angina pectoris: Secondary | ICD-10-CM | POA: Diagnosis not present

## 2020-04-04 DIAGNOSIS — F17210 Nicotine dependence, cigarettes, uncomplicated: Secondary | ICD-10-CM | POA: Diagnosis not present

## 2020-04-04 HISTORY — PX: INTRAVASCULAR ULTRASOUND/IVUS: CATH118244

## 2020-04-04 HISTORY — PX: LEFT HEART CATH AND CORONARY ANGIOGRAPHY: CATH118249

## 2020-04-04 HISTORY — PX: CORONARY ATHERECTOMY: CATH118238

## 2020-04-04 LAB — CBC
HCT: 46.4 % (ref 39.0–52.0)
Hemoglobin: 15.9 g/dL (ref 13.0–17.0)
MCH: 32.5 pg (ref 26.0–34.0)
MCHC: 34.3 g/dL (ref 30.0–36.0)
MCV: 94.9 fL (ref 80.0–100.0)
Platelets: 172 10*3/uL (ref 150–400)
RBC: 4.89 MIL/uL (ref 4.22–5.81)
RDW: 12 % (ref 11.5–15.5)
WBC: 8.5 10*3/uL (ref 4.0–10.5)
nRBC: 0 % (ref 0.0–0.2)

## 2020-04-04 LAB — BASIC METABOLIC PANEL
Anion gap: 11 (ref 5–15)
BUN: 13 mg/dL (ref 8–23)
CO2: 23 mmol/L (ref 22–32)
Calcium: 8.9 mg/dL (ref 8.9–10.3)
Chloride: 101 mmol/L (ref 98–111)
Creatinine, Ser: 0.73 mg/dL (ref 0.61–1.24)
GFR calc Af Amer: >60 mL/min (ref >60)
GFR calc non Af Amer: >60 mL/min (ref >60)
Glucose, Bld: 300 mg/dL — ABNORMAL HIGH (ref 70–99)
Potassium: 3.6 mmol/L (ref 3.5–5.1)
Sodium: 135 mmol/L (ref 135–145)

## 2020-04-04 LAB — POCT ACTIVATED CLOTTING TIME
Activated Clotting Time: 274 seconds
Activated Clotting Time: 323 seconds
Activated Clotting Time: 268 seconds
Activated Clotting Time: 472 seconds

## 2020-04-04 LAB — GLUCOSE, CAPILLARY
Glucose-Capillary: 270 mg/dL — ABNORMAL HIGH (ref 70–99)
Glucose-Capillary: 339 mg/dL — ABNORMAL HIGH (ref 70–99)
Glucose-Capillary: 224 mg/dL — ABNORMAL HIGH (ref 70–99)

## 2020-04-04 SURGERY — CORONARY ATHERECTOMY
Anesthesia: LOCAL

## 2020-04-04 MED ORDER — ACETAMINOPHEN 325 MG PO TABS: 650.0000 mg | ORAL_TABLET | ORAL | Status: DC | PRN

## 2020-04-04 MED ORDER — NITROGLYCERIN 1 MG/10 ML FOR IR/CATH LAB
INTRA_ARTERIAL | Status: DC | PRN
  Administered 2020-04-04 (×2): 200 ug via INTRACORONARY
  Administered 2020-04-04: 400 ug via INTRA_ARTERIAL
  Administered 2020-04-04 (×3): 200 ug via INTRACORONARY

## 2020-04-04 MED ORDER — PANTOPRAZOLE SODIUM 40 MG PO TBEC
40.0000 mg | DELAYED_RELEASE_TABLET | Freq: Once | ORAL | Status: AC
  Administered 2020-04-04: 40 mg via ORAL

## 2020-04-04 MED ORDER — SODIUM CHLORIDE 0.9 % IV SOLN: 250.0000 mL | INTRAVENOUS | Status: DC | PRN

## 2020-04-04 MED ORDER — HYDROMORPHONE HCL 1 MG/ML IJ SOLN
INTRAMUSCULAR | Status: AC
  Filled 2020-04-04: qty 0.5

## 2020-04-04 MED ORDER — SODIUM CHLORIDE 0.9 % WEIGHT BASED INFUSION: 1.0000 mL/kg/h | INTRAVENOUS | Status: DC

## 2020-04-04 MED ORDER — VERAPAMIL HCL 2.5 MG/ML IV SOLN
INTRAVENOUS | Status: AC
  Filled 2020-04-04: qty 2

## 2020-04-04 MED ORDER — SODIUM CHLORIDE 0.9% FLUSH: 3.0000 mL | Freq: Two times a day (BID) | INTRAVENOUS | Status: DC

## 2020-04-04 MED ORDER — SODIUM CHLORIDE 0.9% FLUSH: 3.0000 mL | INTRAVENOUS | Status: DC | PRN

## 2020-04-04 MED ORDER — LABETALOL HCL 5 MG/ML IV SOLN: 10.0000 mg | INTRAVENOUS | Status: AC | PRN

## 2020-04-04 MED ORDER — HEPARIN SODIUM (PORCINE) 1000 UNIT/ML IJ SOLN
INTRAMUSCULAR | Status: AC
  Filled 2020-04-04: qty 1

## 2020-04-04 MED ORDER — IOHEXOL 350 MG/ML SOLN
INTRAVENOUS | Status: DC | PRN
  Administered 2020-04-04: 160 mL via INTRA_ARTERIAL

## 2020-04-04 MED ORDER — HYDRALAZINE HCL 20 MG/ML IJ SOLN
INTRAMUSCULAR | Status: AC
  Filled 2020-04-04: qty 1

## 2020-04-04 MED ORDER — MIDAZOLAM HCL 2 MG/2ML IJ SOLN
INTRAMUSCULAR | Status: AC
  Filled 2020-04-04: qty 2

## 2020-04-04 MED ORDER — CLOPIDOGREL BISULFATE 75 MG PO TABS
75.0000 mg | ORAL_TABLET | Freq: Every day | ORAL | Status: DC
  Administered 2020-04-05: 75 mg via ORAL
  Filled 2020-04-04: qty 1

## 2020-04-04 MED ORDER — PANTOPRAZOLE SODIUM 40 MG PO TBEC
40.0000 mg | DELAYED_RELEASE_TABLET | Freq: Every day | ORAL | Status: DC
  Filled 2020-04-04: qty 1

## 2020-04-04 MED ORDER — MORPHINE SULFATE (PF) 2 MG/ML IV SOLN
2.0000 mg | INTRAVENOUS | Status: DC | PRN
  Administered 2020-04-04 – 2020-04-05 (×3): 2 mg via INTRAVENOUS
  Filled 2020-04-04 (×3): qty 1

## 2020-04-04 MED ORDER — FENTANYL CITRATE (PF) 100 MCG/2ML IJ SOLN
INTRAMUSCULAR | Status: AC
  Filled 2020-04-04: qty 2

## 2020-04-04 MED ORDER — MIDAZOLAM HCL 2 MG/2ML IJ SOLN
INTRAMUSCULAR | Status: DC | PRN
  Administered 2020-04-04: 2 mg via INTRAVENOUS
  Administered 2020-04-04 (×3): 1 mg via INTRAVENOUS

## 2020-04-04 MED ORDER — VIPERSLIDE LUBRICANT OPTIME: TOPICAL | Status: DC | PRN

## 2020-04-04 MED ORDER — SODIUM CHLORIDE 0.9 % WEIGHT BASED INFUSION
3.0000 mL/kg/h | INTRAVENOUS | Status: DC
  Administered 2020-04-04: 3 mL/kg/h via INTRAVENOUS

## 2020-04-04 MED ORDER — NITROGLYCERIN 1 MG/10 ML FOR IR/CATH LAB
INTRA_ARTERIAL | Status: AC
  Filled 2020-04-04: qty 10

## 2020-04-04 MED ORDER — FENTANYL CITRATE (PF) 100 MCG/2ML IJ SOLN
INTRAMUSCULAR | Status: DC | PRN
  Administered 2020-04-04: 50 ug via INTRAVENOUS
  Administered 2020-04-04 (×2): 25 ug via INTRAVENOUS
  Administered 2020-04-04: 50 ug via INTRAVENOUS

## 2020-04-04 MED ORDER — NITROGLYCERIN IN D5W 200-5 MCG/ML-% IV SOLN
INTRAVENOUS | Status: AC
  Filled 2020-04-04: qty 250

## 2020-04-04 MED ORDER — LIDOCAINE HCL (PF) 1 % IJ SOLN
INTRAMUSCULAR | Status: DC | PRN
  Administered 2020-04-04: 2 mL via INTRADERMAL

## 2020-04-04 MED ORDER — HYDROMORPHONE HCL 1 MG/ML IJ SOLN
INTRAMUSCULAR | Status: DC | PRN
  Administered 2020-04-04 (×3): 1 mg via INTRAVENOUS

## 2020-04-04 MED ORDER — ASPIRIN 81 MG PO CHEW
81.0000 mg | CHEWABLE_TABLET | Freq: Every day | ORAL | Status: DC
  Administered 2020-04-05: 81 mg via ORAL
  Filled 2020-04-04: qty 1

## 2020-04-04 MED ORDER — FAMOTIDINE IN NACL 20-0.9 MG/50ML-% IV SOLN
INTRAVENOUS | Status: AC | PRN
  Administered 2020-04-04: 20 mg via INTRAVENOUS

## 2020-04-04 MED ORDER — ONDANSETRON HCL 4 MG/2ML IJ SOLN
4.0000 mg | Freq: Four times a day (QID) | INTRAMUSCULAR | Status: DC | PRN
  Administered 2020-04-04 (×2): 4 mg via INTRAVENOUS
  Filled 2020-04-04 (×2): qty 2

## 2020-04-04 MED ORDER — VERAPAMIL HCL 2.5 MG/ML IV SOLN
INTRAVENOUS | Status: DC | PRN
  Administered 2020-04-04: 10 mL via INTRA_ARTERIAL

## 2020-04-04 MED ORDER — NITROGLYCERIN IN D5W 200-5 MCG/ML-% IV SOLN
INTRAVENOUS | Status: AC | PRN
  Administered 2020-04-04: 10 ug/min via INTRAVENOUS

## 2020-04-04 MED ORDER — HYDRALAZINE HCL 20 MG/ML IJ SOLN: 10.0000 mg | INTRAMUSCULAR | Status: AC | PRN

## 2020-04-04 MED ORDER — HEPARIN SODIUM (PORCINE) 1000 UNIT/ML IJ SOLN
INTRAMUSCULAR | Status: DC | PRN
  Administered 2020-04-04: 4000 [IU] via INTRAVENOUS
  Administered 2020-04-04: 10000 [IU] via INTRAVENOUS
  Administered 2020-04-04: 2000 [IU] via INTRAVENOUS

## 2020-04-04 MED ORDER — IOHEXOL 350 MG/ML SOLN
INTRAVENOUS | Status: AC
  Filled 2020-04-04: qty 1

## 2020-04-04 MED ORDER — INSULIN DETEMIR 100 UNIT/ML ~~LOC~~ SOLN
20.0000 [IU] | Freq: Every day | SUBCUTANEOUS | Status: DC
  Administered 2020-04-04 – 2020-04-05 (×2): 20 [IU] via SUBCUTANEOUS
  Filled 2020-04-04 (×2): qty 0.2

## 2020-04-04 MED ORDER — SODIUM CHLORIDE 0.9% FLUSH
3.0000 mL | Freq: Two times a day (BID) | INTRAVENOUS | Status: DC
  Administered 2020-04-04 – 2020-04-05 (×2): 3 mL via INTRAVENOUS

## 2020-04-04 MED ORDER — HYDRALAZINE HCL 20 MG/ML IJ SOLN
INTRAMUSCULAR | Status: DC | PRN
  Administered 2020-04-04 (×2): 10 mg via INTRAVENOUS

## 2020-04-04 MED ORDER — FAMOTIDINE IN NACL 20-0.9 MG/50ML-% IV SOLN
INTRAVENOUS | Status: AC
  Filled 2020-04-04: qty 50

## 2020-04-04 MED ORDER — ASPIRIN 81 MG PO CHEW
81.0000 mg | CHEWABLE_TABLET | Freq: Every day | ORAL | Status: DC
Start: 2020-04-04 — End: 2020-04-04

## 2020-04-04 MED ORDER — VERAPAMIL HCL 2.5 MG/ML IV SOLN
INTRAVENOUS | Status: DC | PRN
  Administered 2020-04-04 (×3): 200 ug via INTRACORONARY

## 2020-04-04 MED ORDER — NICOTINE 21 MG/24HR TD PT24
21.0000 mg | MEDICATED_PATCH | Freq: Every day | TRANSDERMAL | Status: DC
  Filled 2020-04-04: qty 1

## 2020-04-04 MED ORDER — SODIUM CHLORIDE 0.9 % IV SOLN: INTRAVENOUS | Status: AC

## 2020-04-04 MED ORDER — HEPARIN (PORCINE) IN NACL 1000-0.9 UT/500ML-% IV SOLN
INTRAVENOUS | Status: AC
  Filled 2020-04-04: qty 1000

## 2020-04-04 MED ORDER — LIDOCAINE HCL (PF) 1 % IJ SOLN
INTRAMUSCULAR | Status: AC
  Filled 2020-04-04: qty 30

## 2020-04-04 SURGICAL SUPPLY — 29 items
BALLN SAPPHIRE 2.0X10 (BALLOONS) ×2
BALLN SAPPHIRE 2.5X20 (BALLOONS) ×2
BALLN SAPPHIRE ~~LOC~~ 3.75X18 (BALLOONS) ×2 IMPLANT
BALLOON SAPPHIRE 2.0X10 (BALLOONS) ×1 IMPLANT
BALLOON SAPPHIRE 2.5X20 (BALLOONS) ×1 IMPLANT
CATH INFINITI JR4 5F (CATHETERS) ×2 IMPLANT
CATH LAUNCHER 6FR EBU 3.75 (CATHETERS) ×2 IMPLANT
CATH OPTICROSS HD (CATHETERS) ×2 IMPLANT
CROWN DIAMONDBACK CLASSIC 1.25 (BURR) ×2 IMPLANT
ELECT DEFIB PAD ADLT CADENCE (PAD) ×2 IMPLANT
GLIDESHEATH SLEND SS 6F .021 (SHEATH) ×2 IMPLANT
GUIDEWIRE INQWIRE 1.5J.035X260 (WIRE) ×1 IMPLANT
INQWIRE 1.5J .035X260CM (WIRE) ×2
KIT ENCORE 26 ADVANTAGE (KITS) ×2 IMPLANT
KIT HEART LEFT (KITS) ×2 IMPLANT
KIT HEMO VALVE WATCHDOG (MISCELLANEOUS) ×2 IMPLANT
LUBRICANT VIPERSLIDE CORONARY (MISCELLANEOUS) ×2 IMPLANT
PACK CARDIAC CATHETERIZATION (CUSTOM PROCEDURE TRAY) ×2 IMPLANT
SLED PULL BACK IVUS (MISCELLANEOUS) ×2 IMPLANT
STENT SYNERGY XD 3.0X38 (Permanent Stent) ×1 IMPLANT
STENT SYNERGY XD 3.50X16 (Permanent Stent) ×1 IMPLANT
SYNERGY XD 3.0X38 (Permanent Stent) ×2 IMPLANT
SYNERGY XD 3.50X16 (Permanent Stent) ×2 IMPLANT
TRANSDUCER W/STOPCOCK (MISCELLANEOUS) ×2 IMPLANT
TUBING CIL FLEX 10 FLL-RA (TUBING) ×2 IMPLANT
WIRE ASAHI FIELDER XT 190CM (WIRE) ×2 IMPLANT
WIRE ASAHI PROWATER 180CM (WIRE) ×4 IMPLANT
WIRE HI TORQ BMW 190CM (WIRE) ×2 IMPLANT
WIRE VIPERWIRE COR FLEX .012 (WIRE) ×2 IMPLANT

## 2020-04-04 NOTE — Progress Notes (Signed)
Pt eagerly awaiting cath lab. Has been ambulating. Discussed stents, restrictions, Plavix, smoking cessation, diet (esp DM control), exercise, NTG, and CRPII. Pt voiced understanding and is thinking about quitting smoking. Gave him resources and encouraged making a plan. He plans to adjust diet as well. Will refer to [facility] CRPII.  [id number] Ethelda Chick CES, ACSM 8:50 AM 04/04/2020

## 2020-04-04 NOTE — Progress Notes (Signed)
Patient returned from floor with 10/10 chest pain that persisted from the cath lab post procedure. He stated he really wanted Pepcid AC and that he felt as if he was having indigestion as well as chest pain. Cath lab RNs stated that EKG had been done and MD had seen it. Upon arrival to floor, I had given him Maalox which he said didn't give him any relief. He stated he wanted what he had yesterday. Looking back, it was Protonix and he stated it made him feel much better. Dr. Herbie Baltimore was on the floor. I updated him as to coarse of events since patient arrived. He gave me a one time order for Protonix since there is a daily order that he received this am. Patient got "some relief" with the protonix but he says he "feels like my heart is hurting" and that is 10/10 pain. I paged Vin, PA to make him aware. Orders to be placed for Morphine and he spoke with Dr. Herbie Baltimore on the phone and updated him as I was on the phone with Vin, PA. Patient updated

## 2020-04-04 NOTE — Progress Notes (Addendum)
Inpatient Diabetes Program Recommendations  AACE/ADA: New Consensus Statement on Inpatient Glycemic Control (2015)  Target Ranges:  Prepandial:   less than 140 mg/dL      Peak postprandial:   less than 180 mg/dL (1-2 hours)      Critically ill patients:  140 - 180 mg/dL   Lab Results  Component Value Date   GLUCAP 339 (H) 04/04/2020   HGBA1C 10.9 (H) 04/03/2020    Review of Glycemic Control Results for [name] Gray, [name] Gray (MRN [id number]) as of 04/04/2020 12:39  Ref. Range 04/03/2020 16:49 04/03/2020 21:13 04/04/2020 08:04 04/04/2020 12:37  Glucose-Capillary Latest Ref Range: 70 - 99 mg/dL 313 (H) 322 (H) 270 (H) 339 (H)   Diabetes history: Type 2 DM Outpatient Diabetes medications: Ozempic 0.5 mg Qwk, Metformin 500 mg BID Current orders for Inpatient glycemic control: Novolog 0-15 units TID, Ozempic 0.5 mg Qwk  Inpatient Diabetes Program Recommendations:    Consider starting Levemir 20 units QD (101 kg x 0.2) and Novolog 0-5 units QHS.   Spoke with patient regarding outpatient diabetes management. Patient reports recently changing medications and switched from Victoza to [place]. This past week was his first PCP appoitnment in several years.  Reviewed patient's current A1c of 10.9%. Explained what a A1c is and what it measures. Also reviewed goal A1c with patient, importance of good glucose control @ home, and blood sugar goals. Reviewed patho of DM, need for insulin, role of pancreas, impact of heart with poor control, vascular changes and commorbidities.  Patient has meter but wasn't frequently checking. Would be worth while to order an additional one at discharge. Blood glucose meter (includes lancets/strips) (#[id number]). Encouraged to begin checking blood sugars atleast 2 times per day. Reviewed frequency and when to call MD.  Discussed at length the importance of establishing better control.  Educated patient and spouse on insulin pen use at home. Will attach endocrinology list to DC  summary. Also, outpatient education referral placed as well as [facility] and dietitian consults.  Reviewed contents of insulin flexpen starter kit. Reviewed all steps if insulin pen including attachment of needle, 2-unit air shot, dialing up dose, giving injection, removing needle, disposal of sharps, storage of unused insulin, disposal of insulin etc. Patient able to provide successful return demonstration. Also reviewed troubleshooting with insulin pen. MD to give patient Rxs for insulin pens and insulin pen needles.    Thanks, Bronson Curb, MSN, RNC-OB Diabetes Coordinator [phone] (8a-5p)

## 2020-04-04 NOTE — Interval H&P Note (Signed)
Cath Lab Visit (complete for each Cath Lab visit)  Clinical Evaluation Leading to the Procedure:   ACS: No.  Non-ACS:    Anginal Classification: CCS III  Anti-ischemic medical therapy: Minimal Therapy (1 class of medications)  Non-Invasive Test Results: No non-invasive testing performed  Prior CABG: No previous CABG  Planned atherectomy.  Procedure discussed with patient today, and wife yesterday via phone.  All questions answered.     History and Physical Interval Note:  04/04/2020 9:07 AM  [name] Gray  has presented today for surgery, with the diagnosis of cad.  The various methods of treatment have been discussed with the patient and family. After consideration of risks, benefits and other options for treatment, the patient has consented to  Procedure(s): CORONARY ATHERECTOMY (N/A) as a surgical intervention.  The patient's history has been reviewed, patient examined, no change in status, stable for surgery.  I have reviewed the patient's chart and labs.  Questions were answered to the patient's satisfaction.     Lance Muss

## 2020-04-04 NOTE — Progress Notes (Signed)
Patient received IV morphine for his 10/10 chest pain. Patient was also nauseated again. I also gave him Zofran. The Zofran subsided his nausea and vomiting. However, patient is still complaining of midsternal chest pain but now new left arm pain. EKG obtained. I paged Vin, PA to make him aware. Awaiting return of call. I increased IV nitro to . Just to have a second set of eyes, I asked RR RN to come and assess. Patient does appear more comfortable but still complaining of chest pain.

## 2020-04-04 NOTE — Progress Notes (Signed)
Patient woke up due to emesis episode again. He complains of 6/10 chest pain and stated "its getting better but the left arm is still there." I texted paged Angie Duke, PA and made her aware of events that have transcribed today. She is to get back with me on next plan

## 2020-04-04 NOTE — Significant Event (Addendum)
Rapid Response Event Note  Overview: Chest Pain  Initial Focused Assessment: Called by nurse with concerns of patient having ongoing chest pain after having two stents placed today. Per nurse, patient returned to the floor with 10/10 chest pain. He was given some Protonix and Maalox along with some Morphine IV. Patient was already on a NTG infusion at 65mcg/kg/min.   Per nurse patient looks better now than before but still endorses having constant substernal pressure in the mid chest region and down his LT arm. He is neuro intact, able to follow commands, and moves all extremities. No weakness in LT arm - good strong palpable pulses present. Lung sounds - clear bilaterally. BP 153/84 HR 72 RR 20-22 96% on RA. No increased WOB and no SOB - not in acute distress.   Nurse has already obtained an EKG and paged CARDS APP and is awaiting call back. Upon review of chart, chest pain is not unexpected after this procedure.   Interventions: -- RN to give PRN Morphine and increase NTG infusion  Plan of Care:  -- Monitor VS  -- Treat pain per Cardiology orders   Event Summary:  Call Time 1632 Arrival Time 1636 End Time 1710  Latoy Labriola R

## 2020-04-04 NOTE — Progress Notes (Signed)
    Pt seen post PCI - still having some CP (he saw 10/10 - but was not initially)  Started IV NTG in cath lab (jailed Diag branch with TIMI 1 flow) - will titrate further & provide narcotic as necessary. Will also provide PPI for ? Heartburn.   CP not unexpected post CsI PCI of LAD with jailed Diag TIMI1 flow --> no acute need for re-look cath.   Bryan Lemma, MD

## 2020-04-04 NOTE — Progress Notes (Signed)
I hadn't heard back from Vin yet but I texted him and let him know I did and EKG and increased IV nitro and gave prn morphine. Will text again with response to above

## 2020-04-05 DIAGNOSIS — E785 Hyperlipidemia, unspecified: Secondary | ICD-10-CM | POA: Diagnosis not present

## 2020-04-05 DIAGNOSIS — I25119 Atherosclerotic heart disease of native coronary artery with unspecified angina pectoris: Secondary | ICD-10-CM | POA: Diagnosis not present

## 2020-04-05 DIAGNOSIS — I2584 Coronary atherosclerosis due to calcified coronary lesion: Secondary | ICD-10-CM | POA: Diagnosis not present

## 2020-04-05 DIAGNOSIS — Z955 Presence of coronary angioplasty implant and graft: Secondary | ICD-10-CM | POA: Diagnosis not present

## 2020-04-05 DIAGNOSIS — Z6831 Body mass index (BMI) 31.0-31.9, adult: Secondary | ICD-10-CM | POA: Diagnosis not present

## 2020-04-05 DIAGNOSIS — E118 Type 2 diabetes mellitus with unspecified complications: Secondary | ICD-10-CM | POA: Diagnosis not present

## 2020-04-05 DIAGNOSIS — E1151 Type 2 diabetes mellitus with diabetic peripheral angiopathy without gangrene: Secondary | ICD-10-CM | POA: Diagnosis not present

## 2020-04-05 DIAGNOSIS — Z79899 Other long term (current) drug therapy: Secondary | ICD-10-CM | POA: Diagnosis not present

## 2020-04-05 DIAGNOSIS — I10 Essential (primary) hypertension: Secondary | ICD-10-CM | POA: Diagnosis not present

## 2020-04-05 DIAGNOSIS — E1165 Type 2 diabetes mellitus with hyperglycemia: Secondary | ICD-10-CM | POA: Diagnosis not present

## 2020-04-05 DIAGNOSIS — I209 Angina pectoris, unspecified: Secondary | ICD-10-CM | POA: Diagnosis not present

## 2020-04-05 DIAGNOSIS — Z7982 Long term (current) use of aspirin: Secondary | ICD-10-CM | POA: Diagnosis not present

## 2020-04-05 DIAGNOSIS — F17210 Nicotine dependence, cigarettes, uncomplicated: Secondary | ICD-10-CM | POA: Diagnosis not present

## 2020-04-05 LAB — BASIC METABOLIC PANEL
Anion gap: 12 (ref 5–15)
BUN: 10 mg/dL (ref 8–23)
CO2: 18 mmol/L — ABNORMAL LOW (ref 22–32)
Calcium: 8.7 mg/dL — ABNORMAL LOW (ref 8.9–10.3)
Chloride: 104 mmol/L (ref 98–111)
Creatinine, Ser: 0.76 mg/dL (ref 0.61–1.24)
GFR calc Af Amer: >60 mL/min (ref >60)
GFR calc non Af Amer: >60 mL/min (ref >60)
Glucose, Bld: 211 mg/dL — ABNORMAL HIGH (ref 70–99)
Potassium: 3.7 mmol/L (ref 3.5–5.1)
Sodium: 134 mmol/L — ABNORMAL LOW (ref 135–145)

## 2020-04-05 LAB — CBC
HCT: 48.4 % (ref 39.0–52.0)
Hemoglobin: 16.6 g/dL (ref 13.0–17.0)
MCH: 32.5 pg (ref 26.0–34.0)
MCHC: 34.3 g/dL (ref 30.0–36.0)
MCV: 94.9 fL (ref 80.0–100.0)
Platelets: 180 10*3/uL (ref 150–400)
RBC: 5.1 MIL/uL (ref 4.22–5.81)
RDW: 12.1 % (ref 11.5–15.5)
WBC: 13.5 10*3/uL — ABNORMAL HIGH (ref 4.0–10.5)
nRBC: 0 % (ref 0.0–0.2)

## 2020-04-05 LAB — GLUCOSE, CAPILLARY
Glucose-Capillary: 216 mg/dL — ABNORMAL HIGH (ref 70–99)
Glucose-Capillary: 248 mg/dL — ABNORMAL HIGH (ref 70–99)

## 2020-04-05 MED ORDER — BLOOD GLUCOSE MONITOR KIT: PACK | 0 refills | Status: DC

## 2020-04-05 MED ORDER — ISOSORBIDE MONONITRATE ER 30 MG PO TB24: 30.0000 mg | ORAL_TABLET | Freq: Every day | ORAL | 1 refills | Status: DC

## 2020-04-05 MED ORDER — NITROGLYCERIN IN D5W 200-5 MCG/ML-% IV SOLN
0.0000 ug/min | INTRAVENOUS | Status: DC
  Administered 2020-04-05: 10 ug/min via INTRAVENOUS

## 2020-04-05 MED ORDER — NITROGLYCERIN 1 MG/10 ML FOR IR/CATH LAB
10.0000 ug | Freq: Once | INTRA_ARTERIAL | Status: DC
  Filled 2020-04-05: qty 10

## 2020-04-05 MED ORDER — CLOPIDOGREL BISULFATE 75 MG PO TABS: 75.0000 mg | ORAL_TABLET | Freq: Every day | ORAL | 3 refills | Status: DC

## 2020-04-05 MED ORDER — LEVEMIR FLEXTOUCH 100 UNIT/ML ~~LOC~~ SOPN: 25.0000 [IU] | PEN_INJECTOR | Freq: Every day | SUBCUTANEOUS | 3 refills | Status: AC

## 2020-04-05 MED ORDER — ISOSORBIDE MONONITRATE ER 30 MG PO TB24
30.0000 mg | ORAL_TABLET | Freq: Every day | ORAL | Status: DC
  Administered 2020-04-05: 30 mg via ORAL
  Filled 2020-04-05: qty 1

## 2020-04-05 MED ORDER — INSULIN PEN NEEDLE 32G X 4 MM MISC: 2 refills | Status: AC

## 2020-04-05 NOTE — Discharge Instructions (Signed)
Radial Site Care  This sheet gives you information about how to care for yourself after your procedure. Your health care provider may also give you more specific instructions. If you have problems or questions, contact your health care provider. What can I expect after the procedure? After the procedure, it is common to have:  Bruising and tenderness at the catheter insertion area. Follow these instructions at home: Medicines  Take over-the-counter and prescription medicines only as told by your health care provider. Insertion site care  Follow instructions from your health care provider about how to take care of your insertion site. Make sure you: ? Wash your hands with soap and water before you change your bandage (dressing). If soap and water are not available, use hand sanitizer. ? Change your dressing as told by your health care provider. ? Leave stitches (sutures), skin glue, or adhesive strips in place. These skin closures may need to stay in place for 2 weeks or longer. If adhesive strip edges start to loosen and curl up, you may trim the loose edges. Shakeira Rhee not remove adhesive strips completely unless your health care provider tells you to Berman Grainger that.  Check your insertion site every day for signs of infection. Check for: ? Redness, swelling, or pain. ? Fluid or blood. ? Pus or a bad smell. ? Warmth.  Senia Even not take baths, swim, or use a hot tub until your health care provider approves.  You may shower 24-48 hours after the procedure, or as directed by your health care provider. ? Remove the dressing and gently wash the site with plain soap and water. ? Pat the area dry with a clean towel. ? Marwah Disbro not rub the site. That could cause bleeding.  Meigan Pates not apply powder or lotion to the site. Activity   For 24 hours after the procedure, or as directed by your health care provider: ? Jenisha Faison not flex or bend the affected arm. ? Taylar Hartsough not push or pull heavy objects with the affected arm. ? Noora Locascio not  drive yourself home from the hospital or clinic. You may drive 24 hours after the procedure unless your health care provider tells you not to. ? Hokulani Rogel not operate machinery or power tools.  Ladaisha Portillo not lift anything that is heavier than 10 lb (4.5 kg), or the limit that you are told, until your health care provider says that it is safe.  Ask your health care provider when it is okay to: ? Return to work or school. ? Resume usual physical activities or sports. ? Resume sexual activity. General instructions  If the catheter site starts to bleed, raise your arm and put firm pressure on the site. If the bleeding does not stop, get help right away. This is a medical emergency.  If you went home on the same day as your procedure, a responsible adult should be with you for the first 24 hours after you arrive home.  Keep all follow-up visits as told by your health care provider. This is important. Contact a health care provider if:  You have a fever.  You have redness, swelling, or yellow drainage around your insertion site. Get help right away if:  You have unusual pain at the radial site.  The catheter insertion area swells very fast.  The insertion area is bleeding, and the bleeding does not stop when you hold steady pressure on the area.  Your arm or hand becomes pale, cool, tingly, or numb. These symptoms may represent a serious problem   that is an emergency. Sho Salguero not wait to see if the symptoms will go away. Get medical help right away. Call your local emergency services (911 in the [place] Santino Kinsella not drive yourself to the hospital. Summary  After the procedure, it is common to have bruising and tenderness at the site.  Follow instructions from your health care provider about how to take care of your radial site wound. Check the wound every day for signs of infection.  Ronda Kazmi not lift anything that is heavier than 10 lb (4.5 kg), or the limit that you are told, until your health care provider says  that it is safe. This information is not intended to replace advice given to you by your health care provider. Make sure you discuss any questions you have with your health care provider. Document Revised: 12/14/2017 Document Reviewed: 12/14/2017 Elsevier Patient Education  2020 [facility] [phone]) 1. Dr. Philemon Kingdom 2. Dr. Janie Morning [facility] [phone]) 1. Dr. Delrae Rend [facility] [phone]) 1. Dr. Jacelyn Pi 2. Dr. Anda Kraft [facility] [phone]) 1. Dr. Daneil Dolin [facility] [phone]) [[place] office]  [phone]) [[place] office] 1. Dr. Lenna Sciara Solum 2. Dr. Mee Hives [facility]) [phone]) 1. Autumn Hudnall Ronnald Ramp), PA 2. Dr. Amalia Greenhouse 3. Dr. Marsh Dolly. [facility] [phone]) 1. Dr. Glade Lloyd [facility] [phone]) 1. Dr. Orville Govern 2. Dr. Lelon Huh 3. Dr. Jerelene Redden 4. Alwyn Ren, FNP Dr. Carolynn Serve. [facility] [phone]) Hemoglobin A1c Test Why am I having this test? You may have the hemoglobin A1c test (HbA1c test) done to:  Evaluate your risk for developing diabetes (diabetes mellitus).  Diagnose diabetes.  Monitor long-term control of blood sugar (glucose) in people who have diabetes and help make treatment decisions. This test may be done with other blood glucose tests, such as fasting blood glucose and oral glucose tolerance tests. What is being tested? Hemoglobin is a type of protein in the blood that carries oxygen. Glucose attaches to hemoglobin to form glycated hemoglobin. This test checks the amount of glycated hemoglobin in your blood, which is a good indicator of the average amount of glucose in your blood during the past 2-3 months. What kind of sample  is taken?  A blood sample is required for this test. It is usually collected by inserting a needle into a blood vessel. Tell a health care provider about:  All medicines you are taking, including vitamins, herbs, eye drops, creams, and over-the-counter medicines.  Any blood disorders you have.  Any surgeries you have had.  Any medical conditions you have.  Whether you are pregnant or may be pregnant. How are the results reported? Your results will be reported as a percentage that indicates how much of your hemoglobin has glucose attached to it (is glycated). Your health care provider will compare your results to normal ranges that were established after testing a large group of people (reference ranges). Reference ranges may vary among labs and hospitals. For this test, common reference ranges are:  Adult or child without diabetes: 4-5.6%.  Adult or child with diabetes and good blood glucose control: less than 7%. What Caitrin Pendergraph the results mean? If you have diabetes:  A result of less than 7% is considered normal, meaning that your blood glucose is well controlled.  A result higher than 7% means that your blood glucose is not well controlled,  and your treatment plan may need to be adjusted. If you Daxtyn Rottenberg not have diabetes:  A result within the reference range is considered normal, meaning that you are not at high risk for diabetes.  A result of 5.7-6.4% means that you have a high risk of developing diabetes, and you may have prediabetes. Prediabetes is the condition of having a blood glucose level that is higher than it should be, but not high enough for you to be diagnosed with diabetes. Having prediabetes puts you at risk for developing type 2 diabetes (type 2 diabetes mellitus). You may have more tests, including a repeat HbA1c test.  Results of 6.5% or higher on two separate HbA1c tests mean that you have diabetes. You may have more tests to confirm the diagnosis. Abnormally low HbA1c  values may be caused by:  Pregnancy.  Severe blood loss.  Receiving donated blood (transfusions).  Low red blood cell count (anemia).  Long-term kidney failure.  Some unusual forms (variants) of hemoglobin. Talk with your health care provider about what your results mean. Questions to ask your health care provider Ask your health care provider, or the department that is doing the test:  When will my results be ready?  How will I get my results?  What are my treatment options?  What other tests Niyla Marone I need?  What are my next steps? Summary  The hemoglobin A1c test (HbA1c test) may be done to evaluate your risk for developing diabetes, to diagnose diabetes, and to monitor long-term control of blood sugar (glucose) in people who have diabetes and help make treatment decisions.  Hemoglobin is a type of protein in the blood that carries oxygen. Glucose attaches to hemoglobin to form glycated hemoglobin. This test checks the amount of glycated hemoglobin in your blood, which is a good indicator of the average amount of glucose in your blood during the past 2-3 months.  Talk with your health care provider about what your results mean. This information is not intended to replace advice given to you by your health care provider. Make sure you discuss any questions you have with your health care provider. Document Revised: 10/21/2017 Document Reviewed: 06/21/2017 Elsevier Patient Education  [place]. Diabetes Mellitus and Sick Day Management Blood sugar (glucose) can be difficult to control when you are sick. Common illnesses that can cause problems for people with diabetes (diabetes mellitus) include colds, fever, flu (influenza), nausea, vomiting, and diarrhea. These illnesses can cause stress and loss of body fluids (dehydration), and those issues can cause blood glucose levels to increase. Because of this, it is very important to take your insulin and diabetes medicines and eat  some form of carbohydrate when you are sick. You should make a plan for days when you are sick (sick day plan) as part of your diabetes management plan. You and your health care provider should make this plan in advance. The following guidelines are intended to help you manage an illness that lasts for about 24 hours or less. Your health care provider may also give you more specific instructions. What Ursula Dermody I need to Gitty Osterlund to manage my blood glucose?   Check your blood glucose every 2-4 hours, or as often as told by your health care provider.  Know your sick day treatment goals. Your target blood glucose levels may be different when you are sick.  If you use insulin, take your usual dose. ? If your blood glucose continues to be too high, you may need to  take an additional insulin dose as told by your health care provider.  If you use oral diabetes medicine, you may need to stop taking it if you are not able to eat or drink normally. Ask your health care provider about whether you need to stop taking these medicines while you are sick.  If you use injectable hormone medicines other than insulin to control your diabetes, ask your health care provider about whether you need to stop taking these medicines while you are sick. What else can I Kaidan Harpster to manage my diabetes when I am sick? Check your ketones  If you have type 1 diabetes, check your urine ketones every 4 hours.  If you have type 2 diabetes, check your urine ketones as often as told by your health care provider. Drink fluids  Drink enough fluid to keep your urine clear or pale yellow. This is especially important if you have a fever, vomiting, or diarrhea. Those symptoms can lead to dehydration.  Follow any instructions from your health care provider about beverages to avoid. ? Earmon Sherrow not drink alcohol, caffeine, or drinks that contain a lot of sugar. Take medicines as directed  Take-over-the-counter and prescription medicines only as told by  your health care provider.  Check medicine labels for added sugars. Some medicines may contain sugar or types of sugars that can raise your blood glucose level. What foods can I eat when I am sick?  You need to eat some form of carbohydrates when you are sick. You should eat 45-50 grams (45-50 g) of carbohydrates every 3-4 hours until you feel better. All of the food choices below contain about 15 g of carbohydrates. Plan ahead and keep some of these foods around so you have them if you get sick.  4-6 oz (120-177 mL) carbonated beverage that contains sugar, such as regular (not diet) soda. You may be able to drink carbonated beverages more easily if you open the beverage and let it sit at room temperature for a few minutes before drinking.   of a twin frozen ice pop.  4 oz (120 g) regular gelatin.  4 oz (120 mL) fruit juice.  4 oz (120 g) ice cream or frozen yogurt.  2 oz (60 g) sherbet.  8 oz (240 mL) clear broth or soup.  4 oz (120 g) regular custard.  4 oz (120 g) regular pudding.  8 oz (240 g) plain yogurt.  1 slice bread or toast.  6 saltine crackers.  5 vanilla wafers. Questions to ask your health care provider Consider asking the following questions so you know what to Rilea Arutyunyan on days when you are sick:  Should I adjust my diabetes medicines?  How often Abdulkadir Emmanuel I need to check my blood glucose?  What supplies Triston Skare I need to manage my diabetes at home when I am sick?  What number can I call if I have questions?  What foods and drinks should I avoid? Contact a health care provider if:  You develop symptoms of diabetic ketoacidosis, such as: ? Fatigue. ? Weight loss. ? Excessive thirst. ? Light-headedness. ? Fruity or sweet-smelling breath. ? Excessive urination. ? Vision changes. ? Confusion or irritability. ? Nausea. ? Vomiting. ? Rapid breathing. ? Pain in the abdomen. ? Feeling flushed.  You are unable to drink fluids without vomiting.  You have any of the  following for more than 6 hours: ? Nausea. ? Vomiting. ? Diarrhea.  Your blood glucose is at or above 240 mg/dL (13.3 mmol/L),  even after you take an additional insulin dose.  You have a change in how you think, feel, or act (mental status).  You develop another serious illness.  You have been sick or have had a fever for 2 days or longer and you are not getting better. Get help right away if:  Your blood glucose is lower than 54 mg/dL (3.0 mmol/L).  You have difficulty breathing.  You have moderate or high ketone levels in your urine.  You used emergency glucagon to treat low blood glucose. Summary  Blood sugar (glucose) can be difficult to control when you are sick. Common illnesses that can cause problems for people with diabetes (diabetes mellitus) include colds, fever, flu (influenza), nausea, vomiting, and diarrhea.  Illnesses can cause stress and loss of body fluids (dehydration), and those issues can cause blood glucose levels to increase.  Make a plan for days when you are sick (sick day plan) as part of your diabetes management plan. You and your health care provider should make this plan in advance.  It is very important to take your insulin and diabetes medicines and to eat some form of carbohydrate when you are sick.  Contact your health care provider if have problems managing your blood glucose levels when you are sick, or if you have been sick or had a fever for 2 days or longer and are not getting better. This information is not intended to replace advice given to you by your health care provider. Make sure you discuss any questions you have with your health care provider. Document Revised: 08/06/2016 Document Reviewed: 08/06/2016 Elsevier Patient Education  [place]. Blood Glucose Monitoring, Adult Monitoring your blood sugar (glucose) is an important part of managing your diabetes (diabetes mellitus). Blood glucose monitoring involves checking your  blood glucose as often as directed and keeping a record (log) of your results over time. Checking your blood glucose regularly and keeping a blood glucose log can:  Help you and your health care provider adjust your diabetes management plan as needed, including your medicines or insulin.  Help you understand how food, exercise, illnesses, and medicines affect your blood glucose.  Let you know what your blood glucose is at any time. You can quickly find out if you have low blood glucose (hypoglycemia) or high blood glucose (hyperglycemia). Your health care provider will set individualized treatment goals for you. Your goals will be based on your age, other medical conditions you have, and how you respond to diabetes treatment. Generally, the goal of treatment is to maintain the following blood glucose levels:  Before meals (preprandial): 80-130 mg/dL (4.4-7.2 mmol/L).  After meals (postprandial): below 180 mg/dL (10 mmol/L).  A1c level: less than 7%. Supplies needed:  Blood glucose meter.  Test strips for your meter. Each meter has its own strips. You must use the strips that came with your meter.  A needle to prick your finger (lancet). Arnulfo Batson not use a lancet more than one time.  A device that holds the lancet (lancing device).  A journal or log book to write down your results. How to check your blood glucose  1. Wash your hands with soap and water. 2. Prick the side of your finger (not the tip) with the lancet. Use a different finger each time. 3. Gently rub the finger until a small drop of blood appears. 4. Follow instructions that come with your meter for inserting the test strip, applying blood to the strip, and using your blood glucose  meter. 5. Write down your result and any notes. Some meters allow you to use areas of your body other than your finger (alternative sites) to test your blood. The most common alternative sites are:  Forearm.  Thigh.  Palm of the hand. If you  think you may have hypoglycemia, or if you have a history of not knowing when your blood glucose is getting low (hypoglycemia unawareness), Allesha Aronoff not use alternative sites. Use your finger instead. Alternative sites may not be as accurate as the fingers, because blood flow is slower in these areas. This means that the result you get may be delayed, and it may be different from the result that you would get from your finger. Follow these instructions at home: Blood glucose log   Every time you check your blood glucose, write down your result. Also write down any notes about things that may be affecting your blood glucose, such as your diet and exercise for the day. This information can help you and your health care provider: ? Look for patterns in your blood glucose over time. ? Adjust your diabetes management plan as needed.  Check if your meter allows you to download your records to a computer. Most glucose meters store a record of glucose readings in the meter. If you have type 1 diabetes:  Check your blood glucose 2 or more times a day.  Also check your blood glucose: ? Before every insulin injection. ? Before and after exercise. ? Before meals. ? 2 hours after a meal. ? Occasionally between 2:00 a.m. and 3:00 a.m., as directed. ? Before potentially dangerous tasks, like driving or using heavy machinery. ? At bedtime.  You may need to check your blood glucose more often, up to 6-10 times a day, if you: ? Use an insulin pump. ? Need multiple daily injections (MDI). ? Have diabetes that is not well-controlled. ? Are ill. ? Have a history of severe hypoglycemia. ? Have hypoglycemia unawareness. If you have type 2 diabetes:  If you take insulin or other diabetes medicines, check your blood glucose 2 or more times a day.  If you are on intensive insulin therapy, check your blood glucose 4 or more times a day. Occasionally, you may also need to check between 2:00 a.m. and 3:00 a.m., as  directed.  Also check your blood glucose: ? Before and after exercise. ? Before potentially dangerous tasks, like driving or using heavy machinery.  You may need to check your blood glucose more often if: ? Your medicine is being adjusted. ? Your diabetes is not well-controlled. ? You are ill. General tips  Always keep your supplies with you.  If you have questions or need help, all blood glucose meters have a 24-hour "hotline" phone number that you can call. You may also contact your health care provider.  After you use a few boxes of test strips, adjust (calibrate) your blood glucose meter by following instructions that came with your meter. Contact a health care provider if:  Your blood glucose is at or above 240 mg/dL (13.3 mmol/L) for 2 days in a row.  You have been sick or have had a fever for 2 days or longer, and you are not getting better.  You have any of the following problems for more than 6 hours: ? You cannot eat or drink. ? You have nausea or vomiting. ? You have diarrhea. Get help right away if:  Your blood glucose is lower than 54 mg/dL (3 mmol/L).  You become confused or you have trouble thinking clearly.  You have difficulty breathing.  You have moderate or large ketone levels in your urine. Summary  Monitoring your blood sugar (glucose) is an important part of managing your diabetes (diabetes mellitus).  Blood glucose monitoring involves checking your blood glucose as often as directed and keeping a record (log) of your results over time.  Your health care provider will set individualized treatment goals for you. Your goals will be based on your age, other medical conditions you have, and how you respond to diabetes treatment.  Every time you check your blood glucose, write down your result. Also write down any notes about things that may be affecting your blood glucose, such as your diet and exercise for the day. This information is not intended to  replace advice given to you by your health care provider. Make sure you discuss any questions you have with your health care provider. Document Revised: 09/01/2018 Document Reviewed: 04/19/2016 Elsevier Patient Education  [place]. Insulin Injection Instructions, Single Insulin Dose, Adult A subcutaneous injection is a shot of medicine that is injected into the layer of fat and tissue between skin and muscle. People with type 1 diabetes must take insulin because their bodies Dora Simeone not make it. People with type 2 diabetes may need to take insulin.  There are many different types of insulin. The type of insulin that you take may determine how many injections you give yourself and when you need to give the injections. Supplies needed:  Soap and water to wash hands.  A new, unused insulin syringe.  Your insulin medication bottle (vial).  Alcohol wipes.  A disposal container that is meant for sharp items (sharps container), such as an empty plastic bottle with a cover. How to choose a site for injection The body absorbs insulin differently, depending on where the insulin is injected (injection site). It is best to inject insulin into the same body area each time (for example, always in the abdomen), but you should use a different spot in that area for each injection. Chantelle Verdi not inject the insulin in the same spot each time. There are five main areas that can be used for injecting. These areas include:  Abdomen. This is the preferred area.  Front of thigh.  Upper, outer side of thigh.  Upper, outer side of arm.  Upper, outer part of buttock. How to give a single-dose insulin injection First, follow the steps for Get ready, then continue with the steps for Push air into the vial, then follow the steps for Fill the syringe, and finish with the steps for Inject the insulin. Get ready 1. Wash your hands with soap and water. If soap and water are not available, use hand sanitizer. 2. Before  you give yourself an insulin injection, be sure to test your blood sugar level (blood glucose level) and write down that number. Follow any instructions from your health care provider about what to Kaileen Bronkema if your blood glucose level is higher or lower than your normal range. 3. Use a new, unused insulin syringe each time you need to inject insulin. 4. Check to make sure you have the correct type of insulin syringe for the concentration of insulin that you are using. 5. Check the expiration date and the type of insulin that you are using. 6. If you are using CLEAR insulin, check to see that it is clear and free of clumps. 7. Casondra Gasca not shake the vial to get  it ready. Gently roll the vial between your palms several times. 8. Remove the plastic pop-top covering from the vial of insulin. This type of covering is present on a vial when it is new. 9. Use an alcohol wipe to clean the rubber top of the vial. 10. Remove the plastic cover from the syringe needle. Montavis Schubring not let the needle touch anything. Push air into the vial 1. To bring (draw up) air into the syringe, slowly pull back on the syringe plunger. Stop pulling the plunger when the dose indicator gets to the number of units that you will be using. 2. While you keep the vial right-side-up, poke the needle through the rubber top of the vial. Othal Kubitz not turn the vial upside down to Emorie Mcfate this. 3. Push the plunger all the way into the syringe. Doing that will push air into the vial. 4. Seferino Oscar not take the needle out of the vial yet. Fill the syringe  1. While the needle is still in the vial, turn the vial upside down and hold it at eye level. 2. Slowly pull back on the plunger. Stop pulling the plunger when the dose indicator gets to the desired number of units. 3. If you see air bubbles in the syringe, slowly move the plunger up and down 2 or 3 times to make them go away. ? If you had to move the plunger to get rid of air bubbles, pull back the plunger until the dose  indicator returns to the correct dose. 4. Remove the needle from the vial. Jaziel Bennett not let the needle touch anything. Inject the insulin  1. Use an alcohol wipe to clean the site where you will be injecting the needle. Let the site air-dry. 2. Hold the syringe in your writing hand like a pencil. 3. Use your other hand to pinch and hold about an inch (2.5 cm) of skin. Lorean Ekstrand not directly touch the cleaned part of the skin. 4. Gently but quickly, put the needle straight into the skin. The needle should be at a 90-degree angle (perpendicular) to the skin. 5. Push the needle in as far as it will go (to the hub). 6. When the needle is completely inserted into the skin, use your thumb or index finger of your writing hand to push the plunger all the way into the syringe to inject the insulin. 7. Let go of the skin that you are pinching. Continue to hold the syringe in place with your writing hand. 8. Wait 10 seconds, then pull the needle straight out of the skin. This will allow all of the insulin to go from the syringe and needle into your body. 9. Press and hold the alcohol wipe over the injection site until any bleeding stops. Levi Crass not rub the area. 10. Kameela Leipold not put the plastic cover back on the needle. 11. Discard the syringe and needle directly into a sharps container, such as an empty plastic bottle with a cover. How to throw away supplies  Discard all used needles in a puncture-proof sharps disposal container. You can ask your local pharmacy about where you can get this kind of disposal container, or you can use an empty plastic liquid laundry detergent bottle that has a cover.  Follow the disposal regulations for the area where you live. Gianno Volner not use any syringe or needle more than one time.  Throw away empty vials in the regular trash. Questions to ask your health care provider  How often should I be taking insulin?  How often should I check my blood glucose?  What amount of insulin should I be taking  at each time?  What are the side effects?  What should I Emmory Solivan if my blood glucose is too high?  What should I Kharter Sestak if my blood glucose is too low?  What should I Ludean Duhart if I forget to take my insulin?  What number should I call if I have questions? Where to find more information  American Diabetes Association (ADA): www.diabetes.org  American Association of Diabetes Educators (AADE) Patient Resources: https://www.diabeteseducator.org Summary  A subcutaneous injection is a shot of medicine that is injected into the layer of fat and tissue between skin and muscle.  Before you give yourself an insulin injection, be sure to test your blood sugar level (blood glucose level) and write down that number.  The type of insulin that you take may determine how many injections you give yourself and when you need to give the injections.  Check the expiration date and the type of insulin that you are using.  It is best to inject insulin into the same body area each time (for example, always in the abdomen), but you should use a different spot in that area for each injection. This information is not intended to replace advice given to you by your health care provider. Make sure you discuss any questions you have with your health care provider. Document Revised: 11/11/2017 Document Reviewed: 12/12/2015 Elsevier Patient Education  [place]. Hypoglycemia Hypoglycemia is when the sugar (glucose) level in your blood is too low. Signs of low blood sugar may include:  Feeling: ? Hungry. ? Worried or nervous (anxious). ? Sweaty and clammy. ? Confused. ? Dizzy. ? Sleepy. ? Sick to your stomach (nauseous).  Having: ? A fast heartbeat. ? A headache. ? A change in your vision. ? Tingling or no feeling (numbness) around your mouth, lips, or tongue. ? Jerky movements that you cannot control (seizure).  Having trouble with: ? Moving (coordination). ? Sleeping. ? Passing out  (fainting). ? Getting upset easily (irritability). Low blood sugar can happen to people who have diabetes and people who Floretta Petro not have diabetes. Low blood sugar can happen quickly, and it can be an emergency. Treating low blood sugar Low blood sugar is often treated by eating or drinking something sugary right away, such as:  Fruit juice, 4-6 oz (120-150 mL).  Regular soda (not diet soda), 4-6 oz (120-150 mL).  Low-fat milk, 4 oz (120 mL).  Several pieces of hard candy.  Sugar or honey, 1 Tbsp (15 mL). Treating low blood sugar if you have diabetes If you can think clearly and swallow safely, follow the 15:15 rule:  Take 15 grams of a fast-acting carb (carbohydrate). Talk with your doctor about how much you should take.  Always keep a source of fast-acting carb with you, such as: ? Sugar tablets (glucose pills). Take 3-4 pills. ? 6-8 pieces of hard candy. ? 4-6 oz (120-150 mL) of fruit juice. ? 4-6 oz (120-150 mL) of regular (not diet) soda. ? 1 Tbsp (15 mL) honey or sugar.  Check your blood sugar 15 minutes after you take the carb.  If your blood sugar is still at or below 70 mg/dL (3.9 mmol/L), take 15 grams of a carb again.  If your blood sugar does not go above 70 mg/dL (3.9 mmol/L) after 3 tries, get help right away.  After your blood sugar goes back to normal, eat a meal or a  snack within 1 hour.  Treating very low blood sugar If your blood sugar is at or below 54 mg/dL (3 mmol/L), you have very low blood sugar (severe hypoglycemia). This may also cause:  Passing out.  Jerky movements you cannot control (seizure).  Losing consciousness (coma). This is an emergency. Jeronda Don not wait to see if the symptoms will go away. Get medical help right away. Call your local emergency services (911 in the [place] Randeep Biondolillo not drive yourself to the hospital. If you have very low blood sugar and you cannot eat or drink, you may need a glucagon shot (injection). A family member or friend should  learn how to check your blood sugar and how to give you a glucagon shot. Ask your doctor if you need to have a glucagon shot kit at home. Follow these instructions at home: General instructions  Take over-the-counter and prescription medicines only as told by your doctor.  Stay aware of your blood sugar as told by your doctor.  Limit alcohol intake to no more than 1 drink a day for nonpregnant women and 2 drinks a day for men. One drink equals 12 oz of beer (355 mL), 5 oz of wine (148 mL), or 1 oz of hard liquor (44 mL).  Keep all follow-up visits as told by your doctor. This is important. If you have diabetes:   Follow your diabetes care plan as told by your doctor. Make sure you: ? Know the signs of low blood sugar. ? Take your medicines as told. ? Follow your exercise and meal plan. ? Eat on time. Alley Neils not skip meals. ? Check your blood sugar as often as told by your doctor. Always check it before and after exercise. ? Follow your sick day plan when you cannot eat or drink normally. Make this plan ahead of time with your doctor.  Share your diabetes care plan with: ? Your work or school. ? People you live with.  Check your pee (urine) for ketones: ? When you are sick. ? As told by your doctor.  Carry a card or wear jewelry that says you have diabetes. Contact a doctor if:  You have trouble keeping your blood sugar in your target range.  You have low blood sugar often. Get help right away if:  You still have symptoms after you eat or drink something sugary.  Your blood sugar is at or below 54 mg/dL (3 mmol/L).  You have jerky movements that you cannot control.  You pass out. These symptoms may be an emergency. Raphaella Larkin not wait to see if the symptoms will go away. Get medical help right away. Call your local emergency services (911 in the [place] Jonovan Boedecker not drive yourself to the hospital. Summary  Hypoglycemia happens when the level of sugar (glucose) in your blood is too  low.  Low blood sugar can happen to people who have diabetes and people who Erek Kowal not have diabetes. Low blood sugar can happen quickly, and it can be an emergency.  Make sure you know the signs of low blood sugar and know how to treat it.  Always keep a source of sugar (fast-acting carb) with you to treat low blood sugar. This information is not intended to replace advice given to you by your health care provider. Make sure you discuss any questions you have with your health care provider. Document Revised: 03/01/2019 Document Reviewed: 12/12/2015 Elsevier Patient Education  [place].  Hyperglycemia Hyperglycemia occurs when the level of sugar (glucose)  in the blood is too high. Glucose is a type of sugar that provides the body's main source of energy. Certain hormones (insulin and glucagon) control the level of glucose in the blood. Insulin lowers blood glucose, and glucagon increases blood glucose. Hyperglycemia can result from having too little insulin in the bloodstream, or from the body not responding normally to insulin. Hyperglycemia occurs most often in people who have diabetes (diabetes mellitus), but it can happen in people who Shy Guallpa not have diabetes. It can develop quickly, and it can be life-threatening if it causes you to become severely dehydrated (diabetic ketoacidosis or hyperglycemic hyperosmolar state). Severe hyperglycemia is a medical emergency. What are the causes? If you have diabetes, hyperglycemia may be caused by:  Diabetes medicine.  Medicines that increase blood glucose or affect your diabetes control.  Not eating enough, or not eating often enough.  Changes in physical activity level.  Being sick or having an infection. If you have prediabetes or undiagnosed diabetes:  Hyperglycemia may be caused by those conditions. If you Mason Burleigh not have diabetes, hyperglycemia may be caused by:  Certain medicines, including steroid medicines, beta-blockers, epinephrine,  and thiazide diuretics.  Stress.  Serious illness.  Surgery.  Diseases of the pancreas.  Infection. What increases the risk? Hyperglycemia is more likely to develop in people who have risk factors for diabetes, such as:  Having a family member with diabetes.  Having a gene for type 1 diabetes that is passed from parent to child (inherited).  Living in an area with cold weather conditions.  Exposure to certain viruses.  Certain conditions in which the body's disease-fighting (immune) system attacks itself (autoimmune disorders).  Being overweight or obese.  Having an inactive (sedentary) lifestyle.  Having been diagnosed with insulin resistance.  Having a history of prediabetes, gestational diabetes, or polycystic ovarian syndrome (PCOS).  Being of American-Indian, African-American, Hispanic/Latino, or Asian/Pacific Islander descent. What are the signs or symptoms? Hyperglycemia may not cause any symptoms. If you Shawnta Zimbelman have symptoms, they may include early warning signs, such as:  Increased thirst.  Hunger.  Feeling very tired.  Needing to urinate more often than usual.  Blurry vision. Other symptoms may develop if hyperglycemia gets worse, such as:  Dry mouth.  Loss of appetite.  Fruity-smelling breath.  Weakness.  Unexpected or rapid weight gain or weight loss.  Tingling or numbness in the hands or feet.  Headache.  Skin that does not quickly return to normal after being lightly pinched and released (poor skin turgor).  Abdominal pain.  Cuts or bruises that are slow to heal. How is this diagnosed? Hyperglycemia is diagnosed with a blood test to measure your blood glucose level. This blood test is usually done while you are having symptoms. Your health care provider may also Sherl Yzaguirre a physical exam and review your medical history. You may have more tests to determine the cause of your hyperglycemia, such as:  A fasting blood glucose (FBG) test. You will  not be allowed to eat (you will fast) for at least 8 hours before a blood sample is taken.  An A1c (hemoglobin A1c) blood test. This provides information about blood glucose control over the previous 2-3 months.  An oral glucose tolerance test (OGTT). This measures your blood glucose at two times: ? After fasting. This is your baseline blood glucose level. ? Two hours after drinking a beverage that contains glucose. How is this treated? Treatment depends on the cause of your hyperglycemia. Treatment may include:  Taking  medicine to regulate your blood glucose levels. If you take insulin or other diabetes medicines, your medicine or dosage may be adjusted.  Lifestyle changes, such as exercising more, eating healthier foods, or losing weight.  Treating an illness or infection, if this caused your hyperglycemia.  Checking your blood glucose more often.  Stopping or reducing steroid medicines, if these caused your hyperglycemia. If your hyperglycemia becomes severe and it results in hyperglycemic hyperosmolar state, you must be hospitalized and given IV fluids. Follow these instructions at home:  General instructions  Take over-the-counter and prescription medicines only as told by your health care provider.  Donyale Falcon not use any products that contain nicotine or tobacco, such as cigarettes and e-cigarettes. If you need help quitting, ask your health care provider.  Limit alcohol intake to no more than 1 drink per day for nonpregnant women and 2 drinks per day for men. One drink equals 12 oz of beer, 5 oz of wine, or 1 oz of hard liquor.  Learn to manage stress. If you need help with this, ask your health care provider.  Keep all follow-up visits as told by your health care provider. This is important. Eating and drinking   Maintain a healthy weight.  Exercise regularly, as directed by your health care provider.  Stay hydrated, especially when you exercise, get sick, or spend time in  hot temperatures.  Eat healthy foods, such as: ? Lean proteins. ? Complex carbohydrates. ? Fresh fruits and vegetables. ? Low-fat dairy products. ? Healthy fats.  Drink enough fluid to keep your urine clear or pale yellow. If you have diabetes:  Make sure you know the symptoms of hyperglycemia.  Follow your diabetes management plan, as told by your health care provider. Make sure you: ? Take your insulin and medicines as directed. ? Follow your exercise plan. ? Follow your meal plan. Eat on time, and Sulayman Manning not skip meals. ? Check your blood glucose as often as directed. Make sure to check your blood glucose before and after exercise. If you exercise longer or in a different way than usual, check your blood glucose more often. ? Follow your sick day plan whenever you cannot eat or drink normally. Make this plan in advance with your health care provider.  Share your diabetes management plan with people in your workplace, school, and household.  Check your urine for ketones when you are ill and as told by your health care provider.  Carry a medical alert card or wear medical alert jewelry. Contact a health care provider if:  Your blood glucose is at or above 240 mg/dL (13.3 mmol/L) for 2 days in a row.  You have problems keeping your blood glucose in your target range.  You have frequent episodes of hyperglycemia. Get help right away if:  You have difficulty breathing.  You have a change in how you think, feel, or act (mental status).  You have nausea or vomiting that does not go away. These symptoms may represent a serious problem that is an emergency. Abbagail Scaff not wait to see if the symptoms will go away. Get medical help right away. Call your local emergency services (911 in the [place] Sharline Lehane not drive yourself to the hospital. Summary  Hyperglycemia occurs when the level of sugar (glucose) in the blood is too high.  Hyperglycemia is diagnosed with a blood test to measure your blood  glucose level. This blood test is usually done while you are having symptoms. Your health care provider may also  Hendel Gatliff a physical exam and review your medical history.  If you have diabetes, follow your diabetes management plan as told by your health care provider.  Contact your health care provider if you have problems keeping your blood glucose in your target range. This information is not intended to replace advice given to you by your health care provider. Make sure you discuss any questions you have with your health care provider. Document Revised: 07/26/2016 Document Reviewed: 07/26/2016 Elsevier Patient Education  [place].

## 2020-04-05 NOTE — Discharge Summary (Signed)
Discharge Summary    Patient ID: [name] MRN: [id number]; DOB: Jun 29, 1956  Admit date: 04/03/2020 Discharge date: 04/05/2020  Primary Care Provider: Rory Percy, MD  Primary Cardiologist: Kate Sable, MD  Primary Electrophysiologist:  None   Discharge Diagnoses    Principal Problem:   Angina pectoris [facility]) Active Problems:   DM (diabetes mellitus) type II uncontrolled, periph vascular disorder ([place])   Hyperlipidemia LDL goal <70   OBESITY, MORBID   Essential hypertension, benign   CORONARY ATHEROSCLEROSIS NATIVE CORONARY ARTERY   PERIPHERAL VASCULAR DISEASE    Diagnostic Studies/Procedures    Cath 04/03/2020  Prox RCA lesion is 80% stenosed. A drug-eluting stent was successfully placed using a SYNERGY XD 3.50X20, postdilated to 3.8 mm. Stent size was optimized with IVUS.  Post intervention, there is a 0% residual stenosis.  Mid RCA to Dist RCA lesion is 80% stenosed. THis was in stent restenosis noted by IVUS. Scoring balloon angioplasty was performed using a BALLOON WOLVERINE 3.50X10.  Post intervention, there is a 20% residual stenosis.  Dist RCA lesion is 10% stenosed.  Mid LAD lesion is 80% stenosed. Long, heavily calcified lesion.  The left ventricular systolic function is normal.  LV end diastolic pressure is normal.  The left ventricular ejection fraction is 55-65% by visual estimate.  There is no aortic valve stenosis.  A drug-eluting stent was successfully placed using a SYNERGY XD 3.50X20.   Will plan to bring the patient back for orbital atherectomy of the LAD, tomorrow.  Continue DAPT with aggressive secondary prevention.   I conveyed the results and plans for orbital atherectomy, to his wife [name] 04/04/2020  Prox RCA to Mid RCA lesion is 25% stenosed.  Dist RCA lesion is 10% stenosed.  Mid RCA to Dist RCA lesion is 20% stenosed.  Previously placed Prox RCA drug eluting stent is widely patent. PTCA site is widely  patent.  Mid LAD lesion is 80% stenosed. This was treated with atherectomy. A drug-eluting stent was successfully placed using a SYNERGY XD 3.0X38, postdilated to 3.75 mm.  Post intervention, there is a 0% residual stenosis.  Prox LAD lesion is 70% stenosed. This was treated with atherectomy at high speed. A drug-eluting stent was successfully placed overlapping the above stent, using a SYNERGY XD 3.50X16, postdilated to 3.8 mm.  Post intervention, there is a 0% residual stenosis.  LV end diastolic pressure is normal.  There is no aortic valve stenosis.  Slow flow in jailed diagonal vessel, but improving with time.   Successful PCI of LAD.  Transient diagonal occlusion with stent placement.  Will see how his chest pain does.  Post procedure ECG showed some subtle lateral ST changes likely related to diagonal stenosis.   Information conveyed to his wife.  Aggressive secondary prevention including smoking cessation.   Will start IV NTG to help with BP and help improve flow in diagonal vessel.   _____________   History of Present Illness     [name] Gray is a 64 y.o. male with PMH of CAD, HTN, HLD, DM II and PAD. He underwent stenting for in-stent restenosis in the mid RCA on 07/28/2011 by Dr. Lia Foyer.  This was for a vessel that had been previously stented on 2 occasions.  I was called by Dr. Nadara Mustard last week because of concerns of anginal pain.  I personally reviewed an ECG performed on 03/19/2020 which demonstrated sinus rhythm with no ischemic ST segment or T wave abnormalities.  I reviewed labs dated  03/11/2020: BUN 10, creatinine 0.78, sodium 139, potassium 4.3, normal liver transaminases, hemoglobin 17.5, platelets 187, hemoglobin A1c 10.8%.  Lipids on 03/11/2020 showed total cholesterol 149, triglycerides 183, HDL 32, LDL 85.  TSH was 1.14.  He had quit smoking for several years but started smoking again about 2 years ago.  He smokes a pack and a half of cigarettes  daily.  He said he has not been taking care of himself.  Over the past year he has begun to experience chest pain and pressure when walking up inclines or when physically exerting himself.  This has progressively gotten worse.  He sometimes has heartburn after eating and he said this where the exact same symptoms he had prior to most recent stent placement.  He denies worsening shortness of breath.  He denies palpitations, leg swelling, orthopnea, paroxysmal nocturnal dyspnea.  Hospital Course     Consultants: N/A  Given his concerning symptoms, outpatient cardiac catheterization was recommended.  Furthermore, his Zocor was switched to Crestor 20 mg daily.  He underwent the planned procedure on 04/03/2020 which revealed 80% proximal RCA lesion treated with synergy XT 3.5 x 20 mm DES, 80% mid to distal RCA lesion treated with balloon angioplasty with resultant 20% residual lesion, 80% mid LAD lesion which was long and the heavily calcified, 35 to 65% ejection fraction.  Patient was brought back to the Cath Lab on 04/04/2020 and underwent successful atherectomy and DES to proximal LAD using a Synergy XT 3.5 x 16 mm DES.  Postprocedure, he had transient diagonal occlusion secondary to stent placement.  EKG showed some subtle lateral ST changes likely related to diagonal stenosis.  He was placed on IV nitroglycerin for blood pressure and to help with improving flow down to the diagonal vessel.  Patient was seen in the morning of 04/05/2020 at which time IV nitroglycerin has been weaned off and he has been able to ambulate in the hallways without any chest pain.  He will be discharged on aspirin, Plavix, Crestor and metoprolol.  Imdur 30 mg daily was added prior to discharge.  He also has poorly controlled diabetes with hemoglobin A1c of 10.8. Diabetic coordinator recommended to start Levemir 25 unit every night on top of his previous diabetic medications. He will need close outpatient followup with his PCP.      Did the patient have an acute coronary syndrome (MI, NSTEMI, STEMI, etc) this admission?:  No                               Did the patient have a percutaneous coronary intervention (stent / angioplasty)?:  Yes.     Cath/PCI Registry Performance & Quality Measures: 1. Aspirin prescribed? - Yes 2. ADP Receptor Inhibitor (Plavix/Clopidogrel, Brilinta/Ticagrelor or Effient/Prasugrel) prescribed (includes medically managed patients)? - Yes 3. High Intensity Statin (Lipitor 40-40m or Crestor 20-463m prescribed? - Yes 4. For EF <40%, was ACEI/ARB prescribed? - Not Applicable (EF >/= 4051%5. For EF <40%, Aldosterone Antagonist (Spironolactone or Eplerenone) prescribed? - Not Applicable (EF >/= 4088%6. Cardiac Rehab Phase II ordered (Included Medically managed Patients)? - Yes   _____________  Discharge Vitals Blood pressure (!) 147/83, pulse 67, temperature 98 F (36.7 C), temperature source Oral, resp. rate 20, height _0  (1.778 m), weight 99.7 kg, SpO2 97 %.  Filed Weights   04/03/20 0544 04/04/20 0420 04/05/20 0409  Weight: 101.2 kg 101.5 kg 99.7 kg    Labs &  Radiologic Studies    CBC Recent Labs    04/04/20 0538 04/05/20 0435  WBC 8.5 13.5*  HGB 15.9 16.6  HCT 46.4 48.4  MCV 94.9 94.9  PLT 172 034   Basic Metabolic Panel Recent Labs    04/04/20 0538 04/05/20 0435  NA 135 134*  K 3.6 3.7  CL 101 104  CO2 23 18*  GLUCOSE 300* 211*  BUN 13 10  CREATININE 0.73 0.76  CALCIUM 8.9 8.7*   Liver Function Tests No results for input(s): AST, ALT, ALKPHOS, BILITOT, PROT, ALBUMIN in the last 72 hours. No results for input(s): LIPASE, AMYLASE in the last 72 hours. High Sensitivity Troponin:   No results for input(s): TROPONINIHS in the last 720 hours.  BNP Invalid input(s): POCBNP D-Dimer No results for input(s): DDIMER in the last 72 hours. Hemoglobin A1C Recent Labs    04/03/20 0943  HGBA1C 10.9*   Fasting Lipid Panel No results for input(s): CHOL, HDL,  LDLCALC, TRIG, CHOLHDL, LDLDIRECT in the last 72 hours. Thyroid Function Tests No results for input(s): TSH, T4TOTAL, T3FREE, THYROIDAB in the last 72 hours.  Invalid input(s): FREET3 _____________  CARDIAC CATHETERIZATION  Addendum Date: 04/04/2020    Prox RCA to Mid RCA lesion is 25% stenosed.  Dist RCA lesion is 10% stenosed.  Mid RCA to Dist RCA lesion is 20% stenosed.  Previously placed Prox RCA drug eluting stent is widely patent. PTCA site is widely patent.  Mid LAD lesion is 80% stenosed. This was treated with atherectomy. A drug-eluting stent was successfully placed using a SYNERGY XD 3.0X38, postdilated to 3.75 mm.  Post intervention, there is a 0% residual stenosis.  Prox LAD lesion is 70% stenosed. This was treated with atherectomy at high speed. A drug-eluting stent was successfully placed overlapping the above stent, using a SYNERGY XD 3.50X16, postdilated to 3.8 mm.  Post intervention, there is a 0% residual stenosis.  LV end diastolic pressure is normal.  There is no aortic valve stenosis.  Slow flow in jailed diagonal vessel, but improving with time.  Successful PCI of LAD.  Transient diagonal occlusion with stent placement.  Will see how his chest pain does.  Post procedure ECG showed some subtle lateral ST changes likely related to diagonal stenosis. Information conveyed to his wife. Aggressive secondary prevention including smoking cessation. Will start IV NTG to help with BP and help improve flow in diagonal vessel.   Addendum Date: 04/04/2020    Prox RCA to Mid RCA lesion is 25% stenosed.  Dist RCA lesion is 10% stenosed.  Mid RCA to Dist RCA lesion is 20% stenosed.  Previously placed Prox RCA drug eluting stent is widely patent.  Mid LAD lesion is 80% stenosed. This was treated with atherectomy. A drug-eluting stent was successfully placed using a SYNERGY XD 3.0X38, postdilated to 3.75 mm.  Post intervention, there is a 0% residual stenosis.  Prox LAD lesion is 70%  stenosed. This was treated with atherectomy at high speed. A drug-eluting stent was successfully placed overlapping the above stent, using a SYNERGY XD 3.50X16, postdilated to 3.8 mm.  Post intervention, there is a 0% residual stenosis.  LV end diastolic pressure is normal.  There is no aortic valve stenosis.  Slow flow in jailed diagonal vessel, but improving with time.  Successful PCI of LAD.  Transient diagonal occlusion with stent placement.  Will see how his chest pain does.  Post procedure ECG showed some subtle lateral ST changes likely related to diagonal stenosis. Information  conveyed to his wife. Aggressive secondary prevention including smoking cessation. Will start IV NTG to help with BP and help improve flow in diagonal vessel.   Result Date: 04/04/2020  Prox RCA to Mid RCA lesion is 25% stenosed.  Dist RCA lesion is 10% stenosed.  Mid RCA to Dist RCA lesion is 20% stenosed.  Previously placed Prox RCA drug eluting stent is widely patent.  Mid LAD lesion is 80% stenosed. This was treated with atherectomy. A drug-eluting stent was successfully placed using a SYNERGY XD 3.0X38, postdilated to 3.75 mm.  Post intervention, there is a 0% residual stenosis.  Prox LAD lesion is 70% stenosed. This was treated with atherectomy at high speed. A drug-eluting stent was successfully placed overlapping the above stent, using a SYNERGY XD 3.50X16, postdilated to 3.8 mm.  Post intervention, there is a 0% residual stenosis.  LV end diastolic pressure is normal.  There is no aortic valve stenosis.  Slow flow in jailed diagonal vessel, but improving with time.  A drug-eluting stent was successfully placed using a SYNERGY XD 3.50X16.  Successful PCI of LAD.  Transient diagonal occlusion with stent placement.  Will see how his chest pain does. Information conveyed to his wife. Aggressive secondary prevention including smoking cessation. Will start IV NTG to help with BP and help improve flow in diagonal  vessel.   CARDIAC CATHETERIZATION  Result Date: 04/03/2020  Prox RCA lesion is 80% stenosed. A drug-eluting stent was successfully placed using a SYNERGY XD 3.50X20, postdilated to 3.8 mm. Stent size was optimized with IVUS.  Post intervention, there is a 0% residual stenosis.  Mid RCA to Dist RCA lesion is 80% stenosed. THis was in stent restenosis noted by IVUS. Scoring balloon angioplasty was performed using a BALLOON WOLVERINE 3.50X10.  Post intervention, there is a 20% residual stenosis.  Dist RCA lesion is 10% stenosed.  Mid LAD lesion is 80% stenosed. Long, heavily calcified lesion.  The left ventricular systolic function is normal.  LV end diastolic pressure is normal.  The left ventricular ejection fraction is 55-65% by visual estimate.  There is no aortic valve stenosis.  A drug-eluting stent was successfully placed using a SYNERGY XD 3.50X20.  Will plan to bring the patient back for orbital atherectomy of the LAD, tomorrow. Continue DAPT with aggressive secondary prevention.  I conveyed the results and plans for orbital atherectomy, to his wife [name] Gray,   Disposition   Pt is being discharged home today in good condition.  Follow-up Plans & Appointments    Follow-up Information    Herminio Commons, MD Follow up on 06/04/2020.   Specialty: Cardiology Why: 4:00PM. Cardiology visit Contact information: [place] [address] [phone]        Rory Percy, MD Follow up.   Specialty: Family Medicine Why: need close outpatient followup to control diebetes Contact information: [place] [address] [phone]          Discharge Instructions    AMB Referral to Cardiac Rehabilitation - Phase II   Complete by: As directed    Diagnosis: Coronary Stents   After initial evaluation and assessments completed: Virtual Based Care may be provided alone or in conjunction with Phase 2 Cardiac Rehab based on patient barriers.: Yes   Amb  Referral to Cardiac Rehabilitation   Complete by: As directed    Diagnosis:  Coronary Stents PTCA     After initial evaluation and assessments completed: Virtual Based Care may be  provided alone or in conjunction with Phase 2 Cardiac Rehab based on patient barriers.: Yes   Ambulatory referral to Nutrition and Diabetic Education   Complete by: As directed    Diet - low sodium heart healthy   Complete by: As directed    Discharge instructions   Complete by: As directed    Hold off on restarting metformin until 5/17.  No lifting over 5 lbs for 1 week. No sexual activity for 1 week. Keep procedure site clean & dry. If you notice increased pain, swelling, bleeding or pus, call/return!  You may shower, but no soaking baths/hot tubs/pools for 1 week.   Increase activity slowly   Complete by: As directed       Discharge Medications   Allergies as of 04/05/2020   No Known Allergies     Medication List    TAKE these medications   aspirin EC 81 MG tablet Take 81 mg by mouth daily.   blood glucose meter kit and supplies Kit Dispense based on patient and insurance preference. Use up to four times daily as directed. (FOR ICD-9 250.00, 250.01).   cholecalciferol 25 MCG (1000 UNIT) tablet Commonly known as: VITAMIN D3 Take 1,000 Units by mouth daily.   clopidogrel 75 MG tablet Commonly known as: PLAVIX Take 1 tablet (75 mg total) by mouth daily with breakfast. Start taking on: Apr 06, 2020   Insulin Pen Needle 32G X 4 MM Misc 1 box of 100 each   isosorbide mononitrate 30 MG 24 hr tablet Commonly known as: IMDUR Take 1 tablet (30 mg total) by mouth daily. Start taking on: Apr 06, 2020   Levemir FlexTouch 100 UNIT/ML FlexPen Generic drug: insulin detemir Inject 25 Units into the skin at bedtime.   lisinopril-hydrochlorothiazide 10-12.5 MG tablet Commonly known as: ZESTORETIC Take 1 tablet by mouth daily.   Melatonin 3 MG Caps Take 6 mg by mouth at bedtime.   metFORMIN 500  MG 24 hr tablet Commonly known as: GLUCOPHAGE-XR Take 500 mg by mouth 2 (two) times daily.   metoprolol succinate 25 MG 24 hr tablet Commonly known as: TOPROL-XL Take 25 mg by mouth daily.   multivitamin with minerals Tabs tablet Take 1 tablet by mouth daily.   nitroGLYCERIN 0.4 MG SL tablet Commonly known as: NITROSTAT Place 0.4 mg under the tongue every 5 (five) minutes as needed for chest pain.   Ozempic (0.25 or 0.5 MG/DOSE) 2 MG/1.5ML Sopn Generic drug: Semaglutide(0.25 or 0.5MG/DOS) Inject 0.5 mg into the skin every Friday.   rosuvastatin 20 MG tablet Commonly known as: CRESTOR Take 1 tablet (20 mg total) by mouth daily.          Outstanding Labs/Studies   N/A  Duration of Discharge Encounter   Greater than 30 minutes including physician time.  Hilbert Corrigan, [place] 04/05/2020, 3:12 PM

## 2020-04-05 NOTE — Progress Notes (Signed)
Pt.c/o chest pain  6/10;& wants Nitro iv back macular degeneration on call Niffi made aware & ordered to restart it at .

## 2020-04-05 NOTE — Progress Notes (Addendum)
Inpatient Diabetes Program Recommendations  AACE/ADA: New Consensus Statement on Inpatient Glycemic Control   Target Ranges:  Prepandial:   less than 140 mg/dL      Peak postprandial:   less than 180 mg/dL (1-2 hours)      Critically ill patients:  140 - 180 mg/dL   Results for [name], [name] Gray (MRN [id number]) as of 04/05/2020 13:36  Ref. Range 04/04/2020 08:04 04/04/2020 12:37 04/04/2020 16:34 04/05/2020 08:15 04/05/2020 11:57  Glucose-Capillary Latest Ref Range: 70 - 99 mg/dL 203 (H) 559 (H) 741 (H) 216 (H) 248 (H)   Review of Glycemic Control  Diabetes history: DM2 Outpatient Diabetes medications: Metformin XR 500 mg BID, Ozempic 0.5 mg Qweek (Friday) Current orders for Inpatient glycemic control: Levemir 20 units daily, Novolog 0-15 units TID with meals  Inpatient Diabetes Program Recommendations:   Insulin - Basal: Please consider increasing Levemir to 25 units daily. If MD agreeable, please also order Levemir 5 units x1 now (for total of 25 units today).  Correction (SSI): Please add Novolog 0-5 units QHS for bedtime correction scale.  Insulin - Meal Coverage: Please add Novolog 6 units TID with meals for meal coverage if patient eats at least 50% of meals.  NOTE: Noted consult for Diabetes Coordinator. Diabetes Coordinator is not on campus over the weekend but available by pager from 8am to 5pm for questions or concerns. Inpatient Diabetes Coordinator spoke with patient on 04/04/20 and also educated on insulin pens. Chart reviewed. Recommend increasing Lantus, adding Novolog bedtime correction, and Novolog meal coverage. Will continue to follow while inpatient.  Addendum 04/05/20-Received page from cardiology regarding DM medications outpatient. Given glucose trends inpatient and A1C, would recommend adding Levemir to outpatient DM medicaitons and have patient follw up with PCP.  Spoke with patient over the phone regarding DM medications and discharging new to insulin. Patient reports that  he was just recently switched from Victoza to [facility] by PCP 1 week ago and he was not able to take it this past Friday due to being at the hospital. Discussed Levemir insulin and how it works. Also reviewed glucose goals, hypoglycemia along with treatment. Encouraged patient to call his PCP if he experiences any issues with hypoglycemia so adjustments with DM medications can be made. Encouraged patient to check his glucose 3-4 times a day and to follow up with PCP regarding DM control.  Patient states he has no questions at this time related to DM or insulin.   Thanks, Orlando Penner, RN, MSN, CDE Diabetes Coordinator Inpatient Diabetes Program [phone] (Team Pager from 8am to 5pm)

## 2020-04-05 NOTE — Progress Notes (Addendum)
Progress Note  Patient Name: [name] Gray Date of Encounter: 04/05/2020  Primary Cardiologist: No primary care provider on file.   Subjective   Was on nitro drip for chest pain but has been weaned off.  Denies any chest pain.  States that he has been walking the halls without any pain.  He is adamant that he wants to go home today.  Inpatient Medications    Scheduled Meds: . aspirin  81 mg Oral Daily  . clopidogrel  75 mg Oral Q breakfast  . hydrochlorothiazide  12.5 mg Oral Daily  . insulin aspart  0-15 Units Subcutaneous TID WC  . insulin detemir  20 Units Subcutaneous Daily  . lisinopril  10 mg Oral Daily  . melatonin  3 mg Oral QHS  . metoprolol succinate  25 mg Oral Daily  . nicotine  21 mg Transdermal Daily  . pantoprazole  40 mg Oral Daily  . rosuvastatin  20 mg Oral Daily  . sodium chloride flush  3 mL Intravenous Q12H  . sodium chloride flush  3 mL Intravenous Q12H   Continuous Infusions: . sodium chloride    . sodium chloride    . nitroGLYCERIN 10 mcg/min (04/05/20 0426)   PRN Meds: sodium chloride, sodium chloride, acetaminophen, alum & mag hydroxide-simeth, morphine injection, nitroGLYCERIN, ondansetron (ZOFRAN) IV, sodium chloride flush, sodium chloride flush   Vital Signs    Vitals:   04/05/20 0409 04/05/20 0846 04/05/20 1039 04/05/20 1300  BP: (!) 165/72 (!) 139/120 (!) 156/84 (!) 147/83  Pulse: (!) 52   67  Resp: 18   20  Temp: 98.6 F (37 C)   98 F (36.7 C)  TempSrc: Oral   Oral  SpO2: 98%   97%  Weight: 99.7 kg     Height:        Intake/Output Summary (Last 24 hours) at 04/05/2020 1309 Last data filed at 04/05/2020 0840 Gross per 24 hour  Intake 240 ml  Output 200 ml  Net 40 ml   Last 3 Weights 04/05/2020 04/04/2020 04/03/2020  Weight (lbs) 219 lb 12.8 oz 223 lb 11.2 oz 223 lb  Weight (kg) 99.701 kg 101.47 kg 101.152 kg      Telemetry    Sinus rhythm rate 60s to 80s- Personally Reviewed  ECG    NSR, TWI V3-6, subtle ST  elevation (<31mm) in I,aVL - Personally Reviewed  Physical Exam   GEN: No acute distress.   Neck: No JVD Cardiac: RRR, no murmurs, rubs, or gallops.  Respiratory: Clear to auscultation bilaterally. GI: Soft, nontender, non-distended  MS: No edema; No deformity. Neuro:  Nonfocal  Psych: Normal affect   Labs    High Sensitivity Troponin:  No results for input(s): TROPONINIHS in the last 720 hours.    Chemistry Recent Labs  Lab 04/04/20 0538 04/05/20 0435  NA 135 134*  K 3.6 3.7  CL 101 104  CO2 23 18*  GLUCOSE 300* 211*  BUN 13 10  CREATININE 0.73 0.76  CALCIUM 8.9 8.7*  GFRNONAA >60 >60  GFRAA >60 >60  ANIONGAP 11 12     Hematology Recent Labs  Lab 04/04/20 0538 04/05/20 0435  WBC 8.5 13.5*  RBC 4.89 5.10  HGB 15.9 16.6  HCT 46.4 48.4  MCV 94.9 94.9  MCH 32.5 32.5  MCHC 34.3 34.3  RDW 12.0 12.1  PLT 172 180    BNPNo results for input(s): BNP, PROBNP in the last 168 hours.   DDimer No results for input(s):  DDIMER in the last 168 hours.   Radiology    CARDIAC CATHETERIZATION  Addendum Date: 04/04/2020    Prox RCA to Mid RCA lesion is 25% stenosed.  Dist RCA lesion is 10% stenosed.  Mid RCA to Dist RCA lesion is 20% stenosed.  Previously placed Prox RCA drug eluting stent is widely patent. PTCA site is widely patent.  Mid LAD lesion is 80% stenosed. This was treated with atherectomy. A drug-eluting stent was successfully placed using a SYNERGY XD 3.0X38, postdilated to 3.75 mm.  Post intervention, there is a 0% residual stenosis.  Prox LAD lesion is 70% stenosed. This was treated with atherectomy at high speed. A drug-eluting stent was successfully placed overlapping the above stent, using a SYNERGY XD 3.50X16, postdilated to 3.8 mm.  Post intervention, there is a 0% residual stenosis.  LV end diastolic pressure is normal.  There is no aortic valve stenosis.  Slow flow in jailed diagonal vessel, but improving with time.  Successful PCI of LAD.   Transient diagonal occlusion with stent placement.  Will see how his chest pain does.  Post procedure ECG showed some subtle lateral ST changes likely related to diagonal stenosis. Information conveyed to his wife. Aggressive secondary prevention including smoking cessation. Will start IV NTG to help with BP and help improve flow in diagonal vessel.   Addendum Date: 04/04/2020    Prox RCA to Mid RCA lesion is 25% stenosed.  Dist RCA lesion is 10% stenosed.  Mid RCA to Dist RCA lesion is 20% stenosed.  Previously placed Prox RCA drug eluting stent is widely patent.  Mid LAD lesion is 80% stenosed. This was treated with atherectomy. A drug-eluting stent was successfully placed using a SYNERGY XD 3.0X38, postdilated to 3.75 mm.  Post intervention, there is a 0% residual stenosis.  Prox LAD lesion is 70% stenosed. This was treated with atherectomy at high speed. A drug-eluting stent was successfully placed overlapping the above stent, using a SYNERGY XD 3.50X16, postdilated to 3.8 mm.  Post intervention, there is a 0% residual stenosis.  LV end diastolic pressure is normal.  There is no aortic valve stenosis.  Slow flow in jailed diagonal vessel, but improving with time.  Successful PCI of LAD.  Transient diagonal occlusion with stent placement.  Will see how his chest pain does.  Post procedure ECG showed some subtle lateral ST changes likely related to diagonal stenosis. Information conveyed to his wife. Aggressive secondary prevention including smoking cessation. Will start IV NTG to help with BP and help improve flow in diagonal vessel.   Result Date: 04/04/2020  Prox RCA to Mid RCA lesion is 25% stenosed.  Dist RCA lesion is 10% stenosed.  Mid RCA to Dist RCA lesion is 20% stenosed.  Previously placed Prox RCA drug eluting stent is widely patent.  Mid LAD lesion is 80% stenosed. This was treated with atherectomy. A drug-eluting stent was successfully placed using a SYNERGY XD 3.0X38, postdilated  to 3.75 mm.  Post intervention, there is a 0% residual stenosis.  Prox LAD lesion is 70% stenosed. This was treated with atherectomy at high speed. A drug-eluting stent was successfully placed overlapping the above stent, using a SYNERGY XD 3.50X16, postdilated to 3.8 mm.  Post intervention, there is a 0% residual stenosis.  LV end diastolic pressure is normal.  There is no aortic valve stenosis.  Slow flow in jailed diagonal vessel, but improving with time.  A drug-eluting stent was successfully placed using a SYNERGY XD 3.50X16.  Successful PCI of LAD.  Transient diagonal occlusion with stent placement.  Will see how his chest pain does. Information conveyed to his wife. Aggressive secondary prevention including smoking cessation. Will start IV NTG to help with BP and help improve flow in diagonal vessel.    Cardiac Studies   Cath 04/03/20:  Prox RCA lesion is 80% stenosed. A drug-eluting stent was successfully placed using a SYNERGY XD 3.50X20, postdilated to 3.8 mm. Stent size was optimized with IVUS.  Post intervention, there is a 0% residual stenosis.  Mid RCA to Dist RCA lesion is 80% stenosed. THis was in stent restenosis noted by IVUS. Scoring balloon angioplasty was performed using a BALLOON WOLVERINE 3.50X10.  Post intervention, there is a 20% residual stenosis.  Dist RCA lesion is 10% stenosed.  Mid LAD lesion is 80% stenosed. Long, heavily calcified lesion.  The left ventricular systolic function is normal.  LV end diastolic pressure is normal.  The left ventricular ejection fraction is 55-65% by visual estimate.  There is no aortic valve stenosis.  A drug-eluting stent was successfully placed using a SYNERGY XD 3.50X20.   Will plan to bring the patient back for orbital atherectomy of the LAD, tomorrow.  Continue DAPT with aggressive secondary prevention.   I conveyed the results and plans for orbital atherectomy, to his wife [name],  [place] 04/04/20:  Prox RCA to  Mid RCA lesion is 25% stenosed.  Dist RCA lesion is 10% stenosed.  Mid RCA to Dist RCA lesion is 20% stenosed.  Previously placed Prox RCA drug eluting stent is widely patent. PTCA site is widely patent.  Mid LAD lesion is 80% stenosed. This was treated with atherectomy. A drug-eluting stent was successfully placed using a SYNERGY XD 3.0X38, postdilated to 3.75 mm.  Post intervention, there is a 0% residual stenosis.  Prox LAD lesion is 70% stenosed. This was treated with atherectomy at high speed. A drug-eluting stent was successfully placed overlapping the above stent, using a SYNERGY XD 3.50X16, postdilated to 3.8 mm.  Post intervention, there is a 0% residual stenosis.  LV end diastolic pressure is normal.  There is no aortic valve stenosis.  Slow flow in jailed diagonal vessel, but improving with time.   Successful PCI of LAD.  Transient diagonal occlusion with stent placement.  Will see how his chest pain does.  Post procedure ECG showed some subtle lateral ST changes likely related to diagonal stenosis.   Information conveyed to his wife.  Aggressive secondary prevention including smoking cessation.   Will start IV NTG to help with BP and help improve flow in diagonal vessel.   Patient Profile     64 y.o. male with CAD status post RCA PCI, tobacco use, type 2 diabetes, hypertension, hyperlipidemia who was admitted for cath on 04/03/2020.  Cath showed 80% proximal RCA lesion, status post DES.  80% mid LAD lesion.  Was taken back to the Cath Lab on 5/14 for orbital atherectomy of the LAD.  Successful PCI of LAD, but had jailed diag with TIMI 1 flow.  Assessment & Plan    Coronary artery disease: Cath showed 80% proximal RCA lesion, status post DES.  Also with 80% mid LAD lesion.  Was taken back to the Cath Lab on 5/14 for orbital atherectomy of the LAD.  Successful PCI of LAD, but had jailed diag with TIMI 1 flow.  Has had chest pain requiring NTG gtt, currently chest  pain-free -Continue aspirin 81 mg daily -Continue Plavix 75 mg daily -Continue  rosuvastatin 20 mg daily -Continue metoprolol 25 mg daily -Given chest pain that has been relieved by nitroglycerin, will add Imdur 30 mg daily  Hypertension: Continue metoprolol 25 mg daily, lisinopril 10 mg daily, HCTZ 12.5 mg daily.  Type 2 diabetes mellitus: Diabetes is poorly controlled with an A1c of 10.8%.  He is on Levemir 20 units daily and SSI.   -Appreciate diabetes coordinator assistance: Recommend adding Levemir 25 units to home regimen of Metformin and Ozempic, with PCP follow-up for further management  Tobacco use: He is smoking a pack and half cigarettes daily.    Counseled on the risks of smoking and cessation strongly encouraged   For questions or updates, please contact [facility] Please consult [email or web address] for contact info under        Signed, Little Ishikawa, MD  04/05/2020, 1:09 PM

## 2020-04-05 NOTE — Progress Notes (Signed)
Nutrition Education Note  Received consult for DM diet education. Spoke with patient over the phone. He reports that the diabetes coordinator reviewed diet guidelines and he does not need any further education. Offered to attach DM diet education handouts to discharge instructions, but patient declined offer. Patient is on a heart healthy diet, consuming 100% of meals. BMI=31.54 meets criteria for obesity. No further nutrition needs at this time. Please re-consult if nutrition concerns arise.   Gabriel Rainwater, RD, LDN, CNSC Please refer to [facility] for contact information.

## 2020-04-07 MED FILL — Heparin Sod (Porcine)-NaCl IV Soln 1000 Unit/500ML-0.9%: INTRAVENOUS | Qty: 1000 | Status: AC

## 2020-04-07 MED FILL — Nitroglycerin IV Soln 100 MCG/ML in D5W: INTRA_ARTERIAL | Qty: 10 | Status: AC

## 2020-04-08 ENCOUNTER — Other Ambulatory Visit: Payer: Self-pay | Admitting: *Deleted

## 2020-04-08 NOTE — Patient Outreach (Signed)
[facility]) Care Management  04/08/2020  [name] Gray 05-29-1956 [id number]   Referral received to follow up with member post discharge for diabetes management (A1C >10).  Call placed to member, no answer, HIPAA compliant voice message left.  Will send unsuccessful outreach letter and follow up within the next 3-4 business days.  Kemper Durie, [place], MSN [facility] Care Management  [facility] Manager [phone]

## 2020-04-14 ENCOUNTER — Other Ambulatory Visit: Payer: Self-pay | Admitting: *Deleted

## 2020-04-14 NOTE — Patient Outreach (Signed)
[facility]) Care Management  04/14/2020  [name] Gray 07-Aug-1956 [id number]   Outreach attempt #2 successful however member request call back later today after lunch.     Update @ 1600:  Call placed back to member per his request, identity verified.  This care manager introduced self and stated purpose of call.  [facility] care management services explained.  He report he is improving, not sure if he is open to services at this time.  Benefits of program explained, he verbalizes understanding but again is unsure about involvement.  Program pamphlet was mailed out to member, encouraged to review and this care manager will follow up on decision for engagement within the next week.    Kemper Durie, [place], MSN [facility] Care Management  [facility] Manager [phone]

## 2020-04-16 ENCOUNTER — Ambulatory Visit (INDEPENDENT_AMBULATORY_CARE_PROVIDER_SITE_OTHER): Payer: BC Managed Care – PPO | Admitting: Cardiovascular Disease

## 2020-04-16 ENCOUNTER — Encounter: Payer: Self-pay | Admitting: Cardiovascular Disease

## 2020-04-16 ENCOUNTER — Other Ambulatory Visit: Payer: Self-pay

## 2020-04-16 VITALS — BP 100/62 | HR 86 | Ht 70.0 in | Wt 227.6 lb

## 2020-04-16 DIAGNOSIS — I10 Essential (primary) hypertension: Secondary | ICD-10-CM

## 2020-04-16 DIAGNOSIS — Z955 Presence of coronary angioplasty implant and graft: Secondary | ICD-10-CM

## 2020-04-16 DIAGNOSIS — E785 Hyperlipidemia, unspecified: Secondary | ICD-10-CM | POA: Diagnosis not present

## 2020-04-16 DIAGNOSIS — F17201 Nicotine dependence, unspecified, in remission: Secondary | ICD-10-CM

## 2020-04-16 DIAGNOSIS — Z794 Long term (current) use of insulin: Secondary | ICD-10-CM

## 2020-04-16 DIAGNOSIS — Z136 Encounter for screening for cardiovascular disorders: Secondary | ICD-10-CM

## 2020-04-16 DIAGNOSIS — I25118 Atherosclerotic heart disease of native coronary artery with other forms of angina pectoris: Secondary | ICD-10-CM | POA: Diagnosis not present

## 2020-04-16 DIAGNOSIS — E119 Type 2 diabetes mellitus without complications: Secondary | ICD-10-CM

## 2020-04-16 NOTE — Patient Instructions (Signed)
Medication Instructions:  Continue all current medications.  Labwork: none  Testing/Procedures:  Your physician has requested that you have a carotid duplex. This test is an ultrasound of the carotid arteries in your neck. It looks at blood flow through these arteries that supply the brain with blood. Allow one hour for this exam. There are no restrictions or special instructions.  Your physician has requested that you have an abdominal aorta duplex. During this test, an ultrasound is used to evaluate the aorta. Allow 30 minutes for this exam. Do not eat after midnight the day before and avoid carbonated beverages  Office will contact with results via phone or letter.    Follow-Up: 3 months   Any Other Special Instructions Will Be Listed Below (If Applicable).  If you need a refill on your cardiac medications before your next appointment, please call your pharmacy.

## 2020-04-16 NOTE — Addendum Note (Signed)
Addended by: Lesle Chris on: 04/16/2020 10:46 AM   Modules accepted: Orders

## 2020-04-16 NOTE — Progress Notes (Signed)
SUBJECTIVE: The patient presents for follow-up after undergoing drug-eluting stent placement to the proximal RCA for an 80% stenosis.  There is also a mid to distal 80% RCA stenosis for which he underwent scoring balloon angioplasty.  This was performed on 04/03/2020.  On 04/04/2020 he underwent atherectomy for mid LAD 80% stenosis followed by drug-eluting stent placement.  There was a proximal LAD 70% stenosis that was treated with atherectomy at high speed followed by drug-eluting stent placement overlapping the previously placed stent.  LVEDP was normal.  There was initially some slow flow in the jailed diagonal vessel which improved with time. It was recommended he remain on uninterrupted dual antiplatelet therapy with aspirin and clopidogrel for a minimum of 12 months.  However, given his diffuse disease with multiple stents it was recommended to consider clopidogrel monotherapy after 12 months.  He is doing much better.  He took Imdur for about 2 days after discharge and then stopped it.  He has not had to use any sublingual nitroglycerin.  He has not smoked a cigarette for 2 weeks.  He knows he needs to eat better to bring down his A1c.  He is using nicotine patches.  He has some chronic cervical spine pain.    Social history: He used to own a trucking business.  He now works at the [employer] in [place].  He has known Dr. Rory Percy for years and is his neighbor.  Review of Systems: As per "subjective", otherwise negative.  No Known Allergies  Current Outpatient Medications  Medication Sig Dispense Refill  . aspirin EC 81 MG tablet Take 81 mg by mouth daily.    . blood glucose meter kit and supplies KIT Dispense based on patient and insurance preference. Use up to four times daily as directed. (FOR ICD-9 250.00, 250.01). 1 each 0  . cholecalciferol (VITAMIN D3) 25 MCG (1000 UNIT) tablet Take 1,000 Units by mouth daily.    . clopidogrel (PLAVIX) 75 MG tablet Take 1 tablet (75 mg  total) by mouth daily with breakfast. 90 tablet 3  . insulin detemir (LEVEMIR FLEXTOUCH) 100 UNIT/ML FlexPen Inject 25 Units into the skin at bedtime. 15 mL 3  . Insulin Pen Needle 32G X 4 MM MISC 1 box of 100 each 1 each 2  . isosorbide mononitrate (IMDUR) 30 MG 24 hr tablet Take 1 tablet (30 mg total) by mouth daily. 90 tablet 1  . lisinopril-hydrochlorothiazide (PRINZIDE,ZESTORETIC) 10-12.5 MG per tablet Take 1 tablet by mouth daily.      . Melatonin 3 MG CAPS Take 6 mg by mouth at bedtime.    . metFORMIN (GLUCOPHAGE-XR) 500 MG 24 hr tablet Take 500 mg by mouth 2 (two) times daily.    . metoprolol succinate (TOPROL-XL) 25 MG 24 hr tablet Take 25 mg by mouth daily.      . Multiple Vitamin (MULTIVITAMIN WITH MINERALS) TABS tablet Take 1 tablet by mouth daily.    . nitroGLYCERIN (NITROSTAT) 0.4 MG SL tablet Place 0.4 mg under the tongue every 5 (five) minutes as needed for chest pain.    . rosuvastatin (CRESTOR) 20 MG tablet Take 1 tablet (20 mg total) by mouth daily. 30 tablet 6  . Semaglutide,0.25 or 0.5MG/DOS, (OZEMPIC, 0.25 OR 0.5 MG/DOSE,) 2 MG/1.5ML SOPN Inject 0.5 mg into the skin every Friday.     No current facility-administered medications for this visit.    Past Medical History:  Diagnosis Date  . Diabetes mellitus   .  Hyperlipidemia   . Hypertension   . Presence of stent in coronary artery   . S/P insertion of iliac artery stent     Past Surgical History:  Procedure Laterality Date  . CORONARY ATHERECTOMY N/A 04/04/2020   Procedure: CORONARY ATHERECTOMY;  Surgeon: Jettie Booze, MD;  Location: [place] [facility];  Service: Cardiovascular;  Laterality: N/A;  . CORONARY STENT INTERVENTION N/A 04/03/2020   Procedure: CORONARY STENT INTERVENTION;  Surgeon: Jettie Booze, MD;  Location: [place] [facility];  Service: Cardiovascular;  Laterality: N/A;  . INTRAVASCULAR ULTRASOUND/IVUS N/A 04/03/2020   Procedure: Intravascular Ultrasound/IVUS;  Surgeon: Jettie Booze, MD;  Location: [place] [facility];  Service: Cardiovascular;  Laterality: N/A;  . INTRAVASCULAR ULTRASOUND/IVUS N/A 04/04/2020   Procedure: Intravascular Ultrasound/IVUS;  Surgeon: Jettie Booze, MD;  Location: [place] [facility];  Service: Cardiovascular;  Laterality: N/A;  . LEFT HEART CATH AND CORONARY ANGIOGRAPHY N/A 04/03/2020   Procedure: LEFT HEART CATH AND CORONARY ANGIOGRAPHY;  Surgeon: Jettie Booze, MD;  Location: [place] [facility];  Service: Cardiovascular;  Laterality: N/A;  . LEFT HEART CATH AND CORONARY ANGIOGRAPHY N/A 04/04/2020   Procedure: LEFT HEART CATH AND CORONARY ANGIOGRAPHY;  Surgeon: Jettie Booze, MD;  Location: [facility];  Service: Cardiovascular;  Laterality: N/A;    Social History   Socioeconomic History  . Marital status: Married    Spouse name: Not on file  . Number of children: Not on file  . Years of education: Not on file  . Highest education level: Not on file  Occupational History  . Not on file  Tobacco Use  . Smoking status: Former Smoker    Types: Cigarettes    Quit date: 04/02/2020    Years since quitting: 0.0  . Smokeless tobacco: Former Network engineer and Sexual Activity  . Alcohol use: Yes  . Drug use: No  . Sexual activity: Not on file  Other Topics Concern  . Not on file  Social History Narrative  . Not on file   Social Determinants of Health   Financial Resource Strain:   . Difficulty of Paying Living Expenses:   Food Insecurity:   . Worried About Charity fundraiser in the Last Year:   . Arboriculturist in the Last Year:   Transportation Needs:   . Film/video editor (Medical):   Marland Kitchen Lack of Transportation (Non-Medical):   Physical Activity:   . Days of Exercise per Week:   . Minutes of Exercise per Session:   Stress:   . Feeling of Stress :   Social Connections:   . Frequency of Communication with Friends and Family:   . Frequency of Social Gatherings with Friends and Family:    . Attends Religious Services:   . Active Member of Clubs or Organizations:   . Attends Archivist Meetings:   Marland Kitchen Marital Status:   Intimate Partner Violence:   . Fear of Current or Ex-Partner:   . Emotionally Abused:   Marland Kitchen Physically Abused:   . Sexually Abused:      Vitals:   04/16/20 1016  BP: 100/62  Pulse: 86  SpO2: 93%  Weight: 227 lb 9.6 oz (103.2 kg)  Height: _0  (1.778 m)    Wt Readings from Last 3 Encounters:  04/16/20 227 lb 9.6 oz (103.2 kg)  04/05/20 219 lb 12.8 oz (99.7 kg)  03/27/20 230 lb 3.2 oz (104.4 kg)  PHYSICAL EXAM General: NAD HEENT: Normal. Neck: No JVD, no thyromegaly. Lungs: Clear to auscultation bilaterally with normal respiratory effort. CV: Regular rate and rhythm, normal S1/S2, no S3/S4, no murmur. No pretibial or periankle edema.  No carotid bruit.   Abdomen: Soft, nontender, no distention.  Neurologic: Alert and oriented.  Psych: Normal affect. Skin: Normal. Musculoskeletal: No gross deformities.      Labs: Lab Results  Component Value Date/Time   K 3.7 04/05/2020 04:35 AM   BUN 10 04/05/2020 04:35 AM   CREATININE 0.76 04/05/2020 04:35 AM   ALT 29 07/26/2011 05:40 PM   HGB 16.6 04/05/2020 04:35 AM     Lipids: Lab Results  Component Value Date/Time   LDLCALC 58 07/27/2011 05:00 AM   CHOL 140 07/27/2011 05:00 AM   TRIG 271 (H) 07/27/2011 05:00 AM   HDL 28 (L) 07/27/2011 05:00 AM      Diagnostic Studies/Procedures    Cath 04/03/2020  Prox RCA lesion is 80% stenosed. A drug-eluting stent was successfully placed using a SYNERGY XD 3.50X20, postdilated to 3.8 mm. Stent size was optimized with IVUS.  Post intervention, there is a 0% residual stenosis.  Mid RCA to Dist RCA lesion is 80% stenosed. THis was in stent restenosis noted by IVUS. Scoring balloon angioplasty was performed using a BALLOON WOLVERINE 3.50X10.  Post intervention, there is a 20% residual stenosis.  Dist RCA lesion is 10% stenosed.   Mid LAD lesion is 80% stenosed. Long, heavily calcified lesion.  The left ventricular systolic function is normal.  LV end diastolic pressure is normal.  The left ventricular ejection fraction is 55-65% by visual estimate.  There is no aortic valve stenosis.  A drug-eluting stent was successfully placed using a SYNERGY XD 3.50X20.  Will plan to bring the patient back for orbital atherectomy of the LAD, tomorrow.  Continue DAPT with aggressive secondary prevention.  I conveyed the results and plans for orbital atherectomy, to his wife [name] 04/04/2020  Prox RCA to Mid RCA lesion is 25% stenosed.  Dist RCA lesion is 10% stenosed.  Mid RCA to Dist RCA lesion is 20% stenosed.  Previously placed Prox RCA drug eluting stent is widely patent. PTCA site is widely patent.  Mid LAD lesion is 80% stenosed. This was treated with atherectomy. A drug-eluting stent was successfully placed using a SYNERGY XD 3.0X38, postdilated to 3.75 mm.  Post intervention, there is a 0% residual stenosis.  Prox LAD lesion is 70% stenosed. This was treated with atherectomy at high speed. A drug-eluting stent was successfully placed overlapping the above stent, using a SYNERGY XD 3.50X16, postdilated to 3.8 mm.  Post intervention, there is a 0% residual stenosis.  LV end diastolic pressure is normal.  There is no aortic valve stenosis.  Slow flow in jailed diagonal vessel, but improving with time.  Successful PCI of LAD. Transient diagonal occlusion with stent placement. Will see how his chest pain does. Post procedure ECG showed some subtle lateral ST changes likely related to diagonal stenosis.   Information conveyed to his wife.  Aggressive secondary prevention including smoking cessation.   Will start IV NTG to help with BP and help improve flow in diagonal vessel.     ASSESSMENT AND PLAN:  1.  Coronary artery disease: Symptomatically stable after extensive percutaneous  coronary interventions to both the LAD and RCA.  I will continue dual antiplatelet therapy with aspirin and clopidogrel for 12 months followed by clopidogrel monotherapy afterwards.  Continue rosuvastatin, and  beta-blocker.  He has not taken Imdur for nearly 2 weeks.  Given his history of coronary artery disease and longstanding tobacco use, I will obtain screening carotid ultrasounds and abdominal aorta ultrasound.  2.  Hypertension: BP is normal.  No change to therapy.  3.  Hyperlipidemia: I switched simvastatin to rosuvastatin 20 mg daily on 03/27/2020.  Goal LDL less than 70 with most recent value being 85.  4. Type 2 diabetes mellitus: Diabetes is poorly controlled with an A1c of 10.8%.  He is on Metformin and Victoza now.  He said his symptoms have improved.  5.  Tobacco use: He has not smoked for nearly 2 weeks.  He is using nicotine patches.  Given his history of coronary artery disease and longstanding tobacco use, I will obtain screening carotid ultrasounds and abdominal aorta ultrasound.   Disposition: Follow up 3 months   Kate Sable, M.D., F.A.C.C.

## 2020-04-22 ENCOUNTER — Other Ambulatory Visit: Payer: Self-pay | Admitting: Cardiovascular Disease

## 2020-04-22 DIAGNOSIS — I700 Atherosclerosis of aorta: Secondary | ICD-10-CM

## 2020-04-22 DIAGNOSIS — I779 Disorder of arteries and arterioles, unspecified: Secondary | ICD-10-CM

## 2020-04-23 ENCOUNTER — Other Ambulatory Visit: Payer: Self-pay | Admitting: *Deleted

## 2020-04-23 NOTE — Patient Outreach (Signed)
[facility]) Care Management  04/23/2020  SAVAN RUTA 1956-04-18 [id number]   Outreach attempt #3, successful however member report this is not a good time to talk a she is in a meeting.  Request made for member to call this care manager back when he is available.  Will await call back, if no call back will follow up with 4th attempt to engage within the next 4 weeks per workflow.    Kemper Durie, [place], MSN [facility] Care Management  [facility] Manager [phone]

## 2020-05-01 ENCOUNTER — Other Ambulatory Visit: Payer: Self-pay | Admitting: Physician Assistant

## 2020-05-02 ENCOUNTER — Other Ambulatory Visit: Payer: Self-pay | Admitting: Physician Assistant

## 2020-05-05 ENCOUNTER — Encounter (HOSPITAL_COMMUNITY): Payer: Self-pay | Admitting: Interventional Cardiology

## 2020-05-21 ENCOUNTER — Other Ambulatory Visit: Payer: BC Managed Care – PPO

## 2020-05-21 ENCOUNTER — Other Ambulatory Visit: Payer: Self-pay | Admitting: *Deleted

## 2020-05-21 NOTE — Patient Outreach (Signed)
[facility]) Care Management  05/21/2020  LYON DUMONT 08-19-1956 [id number]   Outreach attempt #4, successful however member report this is not a good time to talk a she is in a meeting.  Request made for member to call this care manager back when he is available.   Will await call back, if no call back will close case due to inability to engage.  Kemper Durie, [place], MSN [facility] Care Management  [facility] Manager [phone]

## 2020-05-22 ENCOUNTER — Other Ambulatory Visit: Payer: Self-pay | Admitting: *Deleted

## 2020-05-22 NOTE — Patient Outreach (Signed)
[facility]) Care Management  05/22/2020  ORVELL CAREAGA 01-Apr-1956 [id number]   No response from member after multiple unsuccessful outreach attempts and letter sent.  Will close case at this time due to inability to maintain contact.  Will notify member and primary MD of case closure.  Kemper Durie, [place], MSN [facility] Care Management  [facility] Manager [phone]

## 2020-06-04 ENCOUNTER — Ambulatory Visit: Payer: BC Managed Care – PPO | Admitting: Cardiovascular Disease

## 2020-06-24 ENCOUNTER — Other Ambulatory Visit: Payer: BC Managed Care – PPO

## 2020-07-02 NOTE — Progress Notes (Deleted)
Cardiology Office Note  Date: 07/02/2020   ID: [name] Gray, DOB 1956/03/30, MRN [id number]  PCP:  Rory Percy, MD  Cardiologist:  Kate Sable, MD (Inactive) Electrophysiologist:  None   Chief Complaint: CAD  History of Present Illness: [name] Gray is a 64 y.o. male with a history of CAD, HLD, HTN, DM.  Last visit 04/16/2020 with Dr. Bronson Ing for follow-up after DES to proximal RCA for 80% stenosis.  Mid to distal 80% stenosis of RCA and underwent scoring balloon angioplasty on 04/03/2020.  On 04/04/2020 atherectomy for mid LAD 80% stenosis followed by DES. Proximal LAD stenosis 70% treated with atherectomy and DES overlapping the previous stent. To be on DAPT with ASA / Plavix x 12 months then Plavix alone. Took Imdur x days after d/c then stopped. Had not required SL NTG use. Had stopped smoking at the time. Using nicotine patches. Having chronic cervical spine pain.    Past Medical History:  Diagnosis Date  . Diabetes mellitus   . Hyperlipidemia   . Hypertension   . Presence of stent in coronary artery   . S/P insertion of iliac artery stent     Past Surgical History:  Procedure Laterality Date  . CORONARY ATHERECTOMY N/A 04/04/2020   Procedure: CORONARY ATHERECTOMY;  Surgeon: Jettie Booze, MD;  Location: [place] [facility];  Service: Cardiovascular;  Laterality: N/A;  . CORONARY STENT INTERVENTION N/A 04/03/2020   Procedure: CORONARY STENT INTERVENTION;  Surgeon: Jettie Booze, MD;  Location: [place] [facility];  Service: Cardiovascular;  Laterality: N/A;  . INTRAVASCULAR ULTRASOUND/IVUS N/A 04/03/2020   Procedure: Intravascular Ultrasound/IVUS;  Surgeon: Jettie Booze, MD;  Location: [place] [facility];  Service: Cardiovascular;  Laterality: N/A;  . INTRAVASCULAR ULTRASOUND/IVUS N/A 04/04/2020   Procedure: Intravascular Ultrasound/IVUS;  Surgeon: Jettie Booze, MD;  Location: [place] [facility];  Service: Cardiovascular;  Laterality:  N/A;  . LEFT HEART CATH AND CORONARY ANGIOGRAPHY N/A 04/03/2020   Procedure: LEFT HEART CATH AND CORONARY ANGIOGRAPHY;  Surgeon: Jettie Booze, MD;  Location: [place] [facility];  Service: Cardiovascular;  Laterality: N/A;  . LEFT HEART CATH AND CORONARY ANGIOGRAPHY N/A 04/04/2020   Procedure: LEFT HEART CATH AND CORONARY ANGIOGRAPHY;  Surgeon: Jettie Booze, MD;  Location: [place] [facility];  Service: Cardiovascular;  Laterality: N/A;    Current Outpatient Medications  Medication Sig Dispense Refill  . Blood Glucose Monitoring Suppl (TRUE METRIX AIR GLUCOSE METER) w/Device KIT USE AS DIRECTED 1 kit 0  . aspirin EC 81 MG tablet Take 81 mg by mouth daily.    . cholecalciferol (VITAMIN D3) 25 MCG (1000 UNIT) tablet Take 1,000 Units by mouth daily.    . clopidogrel (PLAVIX) 75 MG tablet Take 1 tablet (75 mg total) by mouth daily with breakfast. 90 tablet 3  . Global Inject Ease Lancets 30G MISC USE FOUR TIMES DAILY 100 each 0  . insulin detemir (LEVEMIR FLEXTOUCH) 100 UNIT/ML FlexPen Inject 25 Units into the skin at bedtime. 15 mL 3  . Insulin Pen Needle 32G X 4 MM MISC 1 box of 100 each 1 each 2  . isosorbide mononitrate (IMDUR) 30 MG 24 hr tablet Take 1 tablet (30 mg total) by mouth daily. 90 tablet 1  . lisinopril-hydrochlorothiazide (PRINZIDE,ZESTORETIC) 10-12.5 MG per tablet Take 1 tablet by mouth daily.      . Melatonin 3 MG CAPS Take 6 mg by mouth at bedtime.    . metFORMIN (GLUCOPHAGE-XR) 500 MG  24 hr tablet Take 500 mg by mouth 2 (two) times daily.    . metoprolol succinate (TOPROL-XL) 25 MG 24 hr tablet Take 25 mg by mouth daily.      . Multiple Vitamin (MULTIVITAMIN WITH MINERALS) TABS tablet Take 1 tablet by mouth daily.    . nitroGLYCERIN (NITROSTAT) 0.4 MG SL tablet Place 0.4 mg under the tongue every 5 (five) minutes as needed for chest pain.    . rosuvastatin (CRESTOR) 20 MG tablet Take 1 tablet (20 mg total) by mouth daily. 30 tablet 6  . Semaglutide,0.25 or  0.5MG/DOS, (OZEMPIC, 0.25 OR 0.5 MG/DOSE,) 2 MG/1.5ML SOPN Inject 0.5 mg into the skin every Friday.     No current facility-administered medications for this visit.   Allergies:  Patient has no known allergies.   Social History: The patient  reports that he quit smoking about 2 months ago. His smoking use included cigarettes. He has quit using smokeless tobacco. He reports current alcohol use. He reports that he does not use drugs.   Family History: The patient's family history is not on file.   ROS:  Please see the history of present illness. Otherwise, complete review of systems is positive for {NONE DEFAULTED:18576::"none"}.  All other systems are reviewed and negative.   Physical Exam: VS:  There were no vitals taken for this visit., BMI There is no height or weight on file to calculate BMI.  Wt Readings from Last 3 Encounters:  04/16/20 227 lb 9.6 oz (103.2 kg)  04/05/20 219 lb 12.8 oz (99.7 kg)  03/27/20 230 lb 3.2 oz (104.4 kg)    General: Patient appears comfortable at rest. HEENT: Conjunctiva and lids normal, oropharynx clear with moist mucosa. Neck: Supple, no elevated JVP or carotid bruits, no thyromegaly. Lungs: Clear to auscultation, nonlabored breathing at rest. Cardiac: Regular rate and rhythm, no S3 or significant systolic murmur, no pericardial rub. Abdomen: Soft, nontender, no hepatomegaly, bowel sounds present, no guarding or rebound. Extremities: No pitting edema, distal pulses 2+. Skin: Warm and dry. Musculoskeletal: No kyphosis. Neuropsychiatric: Alert and oriented x3, affect grossly appropriate.  ECG:  {EKG/Telemetry Strips Reviewed:[phone]}  Recent Labwork: 04/05/2020: BUN 10; Creatinine, Ser 0.76; Hemoglobin 16.6; Platelets 180; Potassium 3.7; Sodium 134     Component Value Date/Time   CHOL 140 07/27/2011 0500   TRIG 271 (H) 07/27/2011 0500   HDL 28 (L) 07/27/2011 0500   CHOLHDL 5.0 07/27/2011 0500   VLDL 54 (H) 07/27/2011 0500   LDLCALC 58  07/27/2011 0500    Other Studies Reviewed Today:  Cath 04/03/2020  Prox RCA lesion is 80% stenosed. A drug-eluting stent was successfully placed using a SYNERGY XD 3.50X20, postdilated to 3.8 mm. Stent size was optimized with IVUS.  Post intervention, there is a 0% residual stenosis.  Mid RCA to Dist RCA lesion is 80% stenosed. THis was in stent restenosis noted by IVUS. Scoring balloon angioplasty was performed using a BALLOON WOLVERINE 3.50X10.  Post intervention, there is a 20% residual stenosis.  Dist RCA lesion is 10% stenosed.  Mid LAD lesion is 80% stenosed. Long, heavily calcified lesion.  The left ventricular systolic function is normal.  LV end diastolic pressure is normal.  The left ventricular ejection fraction is 55-65% by visual estimate.  There is no aortic valve stenosis.  A drug-eluting stent was successfully placed using a SYNERGY XD 3.50X20.  Will plan to bring the patient back for orbital atherectomy of the LAD, tomorrow.  Continue DAPT with aggressive secondary prevention.  I conveyed the results and plans for orbital atherectomy, to his wife Vaughan Basta,  Diagnostic Dominance: Right  Intervention      Cath 04/04/2020  Prox RCA to Mid RCA lesion is 25% stenosed.  Dist RCA lesion is 10% stenosed.  Mid RCA to Dist RCA lesion is 20% stenosed.  Previously placed Prox RCA drug eluting stent is widely patent. PTCA site is widely patent.  Mid LAD lesion is 80% stenosed. This was treated with atherectomy. A drug-eluting stent was successfully placed using a SYNERGY XD 3.0X38, postdilated to 3.75 mm.  Post intervention, there is a 0% residual stenosis.  Prox LAD lesion is 70% stenosed. This was treated with atherectomy at high speed. A drug-eluting stent was successfully placed overlapping the above stent, using a SYNERGY XD 3.50X16, postdilated to 3.8 mm.  Post intervention, there is a 0% residual stenosis.  LV end diastolic pressure is  normal.  There is no aortic valve stenosis.  Slow flow in jailed diagonal vessel, but improving with time.  Successful PCI of LAD. Transient diagonal occlusion with stent placement. Will see how his chest pain does. Post procedure ECG showed some subtle lateral ST changes likely related to diagonal stenosis.   Information conveyed to his wife.  Aggressive secondary prevention including smoking cessation.   Will start IV NTG to help with BP and help improve flow in diagonal vessel. Diagnostic Dominance: Right  Intervention     Assessment and Plan:  1. CAD in native artery   2. Essential hypertension, benign   3. Hyperlipidemia LDL goal <70   4. DM (diabetes mellitus) type II uncontrolled, periph vascular disorder ([place])   5. Tobacco use    1. CAD in native artery ***  2. Essential hypertension, benign ***  3. Hyperlipidemia LDL goal <70 ***  4. DM (diabetes mellitus) type II uncontrolled, periph vascular disorder ([facility]) ***  5. Tobacco use ***  Medication Adjustments/Labs and Tests Ordered: Current medicines are reviewed at length with the patient today.  Concerns regarding medicines are outlined above.   Disposition: Follow-up with ***  Signed, Levell July, NP 07/02/2020 9:43 PM    [facility] at [facility] [place], [place],  [address] Phone: [phone]; Fax: [phone]

## 2020-07-03 ENCOUNTER — Other Ambulatory Visit: Payer: Self-pay | Admitting: Cardiology

## 2020-07-03 ENCOUNTER — Ambulatory Visit: Payer: BC Managed Care – PPO | Admitting: Family Medicine

## 2020-07-03 ENCOUNTER — Encounter: Payer: Self-pay | Admitting: Family Medicine

## 2020-07-03 MED ORDER — ROSUVASTATIN CALCIUM 20 MG PO TABS: 20.0000 mg | ORAL_TABLET | Freq: Every day | ORAL | 3 refills | Status: DC

## 2020-07-03 NOTE — Telephone Encounter (Signed)
Refill filled per request.

## 2020-07-03 NOTE — Telephone Encounter (Signed)
° °  1. Which medications need to be refilled? (please list name of each medication and dose if known)  ROSUVASTATIN 20 MG   2. Which pharmacy/location (including street and city if local pharmacy) is medication to be sent to? [facility]   3. Do they need a 30 day or 90 day supply?

## 2020-09-19 ENCOUNTER — Encounter (HOSPITAL_COMMUNITY): Payer: Self-pay | Admitting: Emergency Medicine

## 2020-09-19 ENCOUNTER — Emergency Department (HOSPITAL_COMMUNITY): Payer: BC Managed Care – PPO

## 2020-09-19 ENCOUNTER — Other Ambulatory Visit: Payer: Self-pay

## 2020-09-19 ENCOUNTER — Encounter (HOSPITAL_COMMUNITY)
Admission: EM | Disposition: A | Discharge status: Home / Self Care | Payer: Self-pay | Source: Home / Self Care | Attending: Cardiothoracic Surgery

## 2020-09-19 ENCOUNTER — Inpatient Hospital Stay (HOSPITAL_COMMUNITY)
Admission: EM | Admit: 2020-09-19 | Discharge: 2020-09-28 | DRG: 234 | Disposition: A | Discharge status: Home / Self Care | Payer: BC Managed Care – PPO | Attending: Cardiothoracic Surgery | Admitting: Cardiothoracic Surgery

## 2020-09-19 DIAGNOSIS — Z951 Presence of aortocoronary bypass graft: Secondary | ICD-10-CM

## 2020-09-19 DIAGNOSIS — E1151 Type 2 diabetes mellitus with diabetic peripheral angiopathy without gangrene: Secondary | ICD-10-CM | POA: Diagnosis present

## 2020-09-19 DIAGNOSIS — I10 Essential (primary) hypertension: Secondary | ICD-10-CM | POA: Diagnosis present

## 2020-09-19 DIAGNOSIS — Z7982 Long term (current) use of aspirin: Secondary | ICD-10-CM

## 2020-09-19 DIAGNOSIS — Z794 Long term (current) use of insulin: Secondary | ICD-10-CM | POA: Diagnosis not present

## 2020-09-19 DIAGNOSIS — R0602 Shortness of breath: Secondary | ICD-10-CM | POA: Diagnosis not present

## 2020-09-19 DIAGNOSIS — J9811 Atelectasis: Secondary | ICD-10-CM | POA: Diagnosis not present

## 2020-09-19 DIAGNOSIS — I25110 Atherosclerotic heart disease of native coronary artery with unstable angina pectoris: Secondary | ICD-10-CM | POA: Diagnosis not present

## 2020-09-19 DIAGNOSIS — I200 Unstable angina: Secondary | ICD-10-CM | POA: Diagnosis not present

## 2020-09-19 DIAGNOSIS — Z7902 Long term (current) use of antithrombotics/antiplatelets: Secondary | ICD-10-CM

## 2020-09-19 DIAGNOSIS — I517 Cardiomegaly: Secondary | ICD-10-CM | POA: Diagnosis not present

## 2020-09-19 DIAGNOSIS — E8770 Fluid overload, unspecified: Secondary | ICD-10-CM | POA: Diagnosis not present

## 2020-09-19 DIAGNOSIS — I2510 Atherosclerotic heart disease of native coronary artery without angina pectoris: Secondary | ICD-10-CM | POA: Diagnosis not present

## 2020-09-19 DIAGNOSIS — E782 Mixed hyperlipidemia: Secondary | ICD-10-CM | POA: Diagnosis present

## 2020-09-19 DIAGNOSIS — D691 Qualitative platelet defects: Secondary | ICD-10-CM | POA: Diagnosis present

## 2020-09-19 DIAGNOSIS — Z20822 Contact with and (suspected) exposure to covid-19: Secondary | ICD-10-CM | POA: Diagnosis present

## 2020-09-19 DIAGNOSIS — I4891 Unspecified atrial fibrillation: Secondary | ICD-10-CM | POA: Diagnosis not present

## 2020-09-19 DIAGNOSIS — E785 Hyperlipidemia, unspecified: Secondary | ICD-10-CM | POA: Diagnosis not present

## 2020-09-19 DIAGNOSIS — D62 Acute posthemorrhagic anemia: Secondary | ICD-10-CM | POA: Diagnosis not present

## 2020-09-19 DIAGNOSIS — R531 Weakness: Secondary | ICD-10-CM | POA: Diagnosis not present

## 2020-09-19 DIAGNOSIS — Z79899 Other long term (current) drug therapy: Secondary | ICD-10-CM | POA: Diagnosis not present

## 2020-09-19 DIAGNOSIS — F17290 Nicotine dependence, other tobacco product, uncomplicated: Secondary | ICD-10-CM | POA: Diagnosis not present

## 2020-09-19 DIAGNOSIS — E119 Type 2 diabetes mellitus without complications: Secondary | ICD-10-CM | POA: Diagnosis not present

## 2020-09-19 DIAGNOSIS — I459 Conduction disorder, unspecified: Secondary | ICD-10-CM | POA: Diagnosis not present

## 2020-09-19 DIAGNOSIS — T82855A Stenosis of coronary artery stent, initial encounter: Secondary | ICD-10-CM | POA: Diagnosis present

## 2020-09-19 DIAGNOSIS — Z6834 Body mass index (BMI) 34.0-34.9, adult: Secondary | ICD-10-CM | POA: Diagnosis not present

## 2020-09-19 DIAGNOSIS — E1165 Type 2 diabetes mellitus with hyperglycemia: Secondary | ICD-10-CM | POA: Diagnosis not present

## 2020-09-19 DIAGNOSIS — E7800 Pure hypercholesterolemia, unspecified: Secondary | ICD-10-CM | POA: Diagnosis not present

## 2020-09-19 DIAGNOSIS — R079 Chest pain, unspecified: Secondary | ICD-10-CM | POA: Diagnosis not present

## 2020-09-19 DIAGNOSIS — J90 Pleural effusion, not elsewhere classified: Secondary | ICD-10-CM | POA: Diagnosis not present

## 2020-09-19 DIAGNOSIS — Z9889 Other specified postprocedural states: Secondary | ICD-10-CM

## 2020-09-19 DIAGNOSIS — Y831 Surgical operation with implant of artificial internal device as the cause of abnormal reaction of the patient, or of later complication, without mention of misadventure at the time of the procedure: Secondary | ICD-10-CM | POA: Diagnosis present

## 2020-09-19 DIAGNOSIS — Z01810 Encounter for preprocedural cardiovascular examination: Secondary | ICD-10-CM | POA: Diagnosis not present

## 2020-09-19 DIAGNOSIS — Z720 Tobacco use: Secondary | ICD-10-CM

## 2020-09-19 DIAGNOSIS — I480 Paroxysmal atrial fibrillation: Secondary | ICD-10-CM | POA: Diagnosis not present

## 2020-09-19 DIAGNOSIS — Z09 Encounter for follow-up examination after completed treatment for conditions other than malignant neoplasm: Secondary | ICD-10-CM

## 2020-09-19 DIAGNOSIS — Z7901 Long term (current) use of anticoagulants: Secondary | ICD-10-CM | POA: Diagnosis not present

## 2020-09-19 DIAGNOSIS — I700 Atherosclerosis of aorta: Secondary | ICD-10-CM | POA: Diagnosis not present

## 2020-09-19 DIAGNOSIS — IMO0002 Reserved for concepts with insufficient information to code with codable children: Secondary | ICD-10-CM | POA: Diagnosis present

## 2020-09-19 DIAGNOSIS — R072 Precordial pain: Secondary | ICD-10-CM | POA: Diagnosis not present

## 2020-09-19 DIAGNOSIS — I071 Rheumatic tricuspid insufficiency: Secondary | ICD-10-CM | POA: Diagnosis not present

## 2020-09-19 DIAGNOSIS — J811 Chronic pulmonary edema: Secondary | ICD-10-CM | POA: Diagnosis not present

## 2020-09-19 HISTORY — DX: Obstructive sleep apnea (adult) (pediatric): G47.33

## 2020-09-19 HISTORY — PX: LEFT HEART CATH AND CORONARY ANGIOGRAPHY: CATH118249

## 2020-09-19 HISTORY — PX: INTRAVASCULAR PRESSURE WIRE/FFR STUDY: CATH118243

## 2020-09-19 LAB — GLUCOSE, CAPILLARY
Glucose-Capillary: 119 mg/dL — ABNORMAL HIGH (ref 70–99)
Glucose-Capillary: 296 mg/dL — ABNORMAL HIGH (ref 70–99)

## 2020-09-19 LAB — BASIC METABOLIC PANEL
Anion gap: 11 (ref 5–15)
BUN: 12 mg/dL (ref 8–23)
CO2: 25 mmol/L (ref 22–32)
Calcium: 9.6 mg/dL (ref 8.9–10.3)
Chloride: 101 mmol/L (ref 98–111)
Creatinine, Ser: 0.9 mg/dL (ref 0.61–1.24)
GFR, Estimated: >60 mL/min (ref >60)
Glucose, Bld: 224 mg/dL — ABNORMAL HIGH (ref 70–99)
Potassium: 3.6 mmol/L (ref 3.5–5.1)
Sodium: 137 mmol/L (ref 135–145)

## 2020-09-19 LAB — HEPATIC FUNCTION PANEL
ALT: 21 U/L (ref 0–44)
AST: 19 U/L (ref 15–41)
Albumin: 4 g/dL (ref 3.5–5.0)
Alkaline Phosphatase: 40 U/L (ref 38–126)
Bilirubin, Direct: <0.1 mg/dL (ref 0.0–0.2)
Total Bilirubin: 0.8 mg/dL (ref 0.3–1.2)
Total Protein: 7.5 g/dL (ref 6.5–8.1)

## 2020-09-19 LAB — TROPONIN I (HIGH SENSITIVITY)
Troponin I (High Sensitivity): 23 ng/L — ABNORMAL HIGH (ref <18)
Troponin I (High Sensitivity): 27 ng/L — ABNORMAL HIGH (ref <18)
Troponin I (High Sensitivity): 29 ng/L — ABNORMAL HIGH (ref <18)

## 2020-09-19 LAB — CBC
HCT: 49.4 % (ref 39.0–52.0)
Hemoglobin: 16.8 g/dL (ref 13.0–17.0)
MCH: 32.6 pg (ref 26.0–34.0)
MCHC: 34 g/dL (ref 30.0–36.0)
MCV: 95.7 fL (ref 80.0–100.0)
Platelets: 172 10*3/uL (ref 150–400)
RBC: 5.16 MIL/uL (ref 4.22–5.81)
RDW: 12.2 % (ref 11.5–15.5)
WBC: 9.1 10*3/uL (ref 4.0–10.5)
nRBC: 0 % (ref 0.0–0.2)

## 2020-09-19 LAB — HEMOGLOBIN A1C
Hgb A1c MFr Bld: 9.8 % — ABNORMAL HIGH (ref 4.8–5.6)
Mean Plasma Glucose: 234.56 mg/dL

## 2020-09-19 LAB — LIPASE, BLOOD: Lipase: 25 U/L (ref 11–51)

## 2020-09-19 LAB — RESPIRATORY PANEL BY RT PCR (FLU A&B, COVID)
Influenza A by PCR: NEGATIVE
Influenza B by PCR: NEGATIVE
SARS Coronavirus 2 by RT PCR: NEGATIVE

## 2020-09-19 LAB — HIV ANTIBODY (ROUTINE TESTING W REFLEX): HIV Screen 4th Generation wRfx: NONREACTIVE

## 2020-09-19 LAB — POCT ACTIVATED CLOTTING TIME: Activated Clotting Time: 263 seconds

## 2020-09-19 SURGERY — LEFT HEART CATH AND CORONARY ANGIOGRAPHY
Anesthesia: LOCAL

## 2020-09-19 MED ORDER — SODIUM CHLORIDE 0.9 % IV SOLN: 250.0000 mL | INTRAVENOUS | Status: DC | PRN

## 2020-09-19 MED ORDER — FENTANYL CITRATE (PF) 100 MCG/2ML IJ SOLN
INTRAMUSCULAR | Status: DC | PRN
  Administered 2020-09-19: 25 ug via INTRAVENOUS

## 2020-09-19 MED ORDER — ASPIRIN EC 81 MG PO TBEC
81.0000 mg | DELAYED_RELEASE_TABLET | Freq: Every day | ORAL | Status: DC
  Administered 2020-09-19 – 2020-09-22 (×4): 81 mg via ORAL
  Filled 2020-09-19 (×4): qty 1

## 2020-09-19 MED ORDER — SODIUM CHLORIDE 0.9% FLUSH: 3.0000 mL | INTRAVENOUS | Status: DC | PRN

## 2020-09-19 MED ORDER — ACETAMINOPHEN 325 MG PO TABS: 650.0000 mg | ORAL_TABLET | ORAL | Status: DC | PRN

## 2020-09-19 MED ORDER — NITROGLYCERIN 0.4 MG SL SUBL: 0.4000 mg | SUBLINGUAL_TABLET | SUBLINGUAL | Status: DC | PRN

## 2020-09-19 MED ORDER — HEPARIN (PORCINE) IN NACL 1000-0.9 UT/500ML-% IV SOLN
INTRAVENOUS | Status: AC
  Filled 2020-09-19: qty 1000

## 2020-09-19 MED ORDER — ROSUVASTATIN CALCIUM 20 MG PO TABS
20.0000 mg | ORAL_TABLET | Freq: Every day | ORAL | Status: DC
  Administered 2020-09-19 – 2020-09-22 (×4): 20 mg via ORAL
  Filled 2020-09-19 (×4): qty 1

## 2020-09-19 MED ORDER — LISINOPRIL 10 MG PO TABS
10.0000 mg | ORAL_TABLET | Freq: Every day | ORAL | Status: DC
  Administered 2020-09-19 – 2020-09-22 (×4): 10 mg via ORAL
  Filled 2020-09-19 (×4): qty 1

## 2020-09-19 MED ORDER — HYDROCHLOROTHIAZIDE 12.5 MG PO CAPS
12.5000 mg | ORAL_CAPSULE | Freq: Every day | ORAL | Status: DC
  Administered 2020-09-19 – 2020-09-22 (×4): 12.5 mg via ORAL
  Filled 2020-09-19 (×4): qty 1

## 2020-09-19 MED ORDER — LISINOPRIL-HYDROCHLOROTHIAZIDE 10-12.5 MG PO TABS: 1.0000 | ORAL_TABLET | Freq: Every day | ORAL | Status: DC

## 2020-09-19 MED ORDER — HEPARIN (PORCINE) 25000 UT/250ML-% IV SOLN
1600.0000 [IU]/h | INTRAVENOUS | Status: DC
  Administered 2020-09-20: 1200 [IU]/h via INTRAVENOUS
  Administered 2020-09-21 – 2020-09-22 (×2): 1600 [IU]/h via INTRAVENOUS
  Filled 2020-09-19 (×5): qty 250

## 2020-09-19 MED ORDER — LABETALOL HCL 5 MG/ML IV SOLN: 10.0000 mg | INTRAVENOUS | Status: AC | PRN

## 2020-09-19 MED ORDER — SODIUM CHLORIDE 0.9 % IV SOLN: INTRAVENOUS | Status: DC

## 2020-09-19 MED ORDER — HEPARIN (PORCINE) 25000 UT/250ML-% IV SOLN
1200.0000 [IU]/h | INTRAVENOUS | Status: DC
  Administered 2020-09-19: 1200 [IU]/h via INTRAVENOUS
  Filled 2020-09-19: qty 250

## 2020-09-19 MED ORDER — HYDRALAZINE HCL 20 MG/ML IJ SOLN: 10.0000 mg | INTRAMUSCULAR | Status: AC | PRN

## 2020-09-19 MED ORDER — SODIUM CHLORIDE 0.9% FLUSH
3.0000 mL | Freq: Two times a day (BID) | INTRAVENOUS | Status: DC
  Administered 2020-09-20 – 2020-09-22 (×3): 3 mL via INTRAVENOUS

## 2020-09-19 MED ORDER — MIDAZOLAM HCL 2 MG/2ML IJ SOLN
INTRAMUSCULAR | Status: DC | PRN
  Administered 2020-09-19: 2 mg via INTRAVENOUS

## 2020-09-19 MED ORDER — HEPARIN BOLUS VIA INFUSION
4000.0000 [IU] | Freq: Once | INTRAVENOUS | Status: AC
  Administered 2020-09-19: 4000 [IU] via INTRAVENOUS
  Filled 2020-09-19: qty 4000

## 2020-09-19 MED ORDER — ADULT MULTIVITAMIN W/MINERALS CH
1.0000 | ORAL_TABLET | Freq: Every day | ORAL | Status: DC
  Administered 2020-09-19 – 2020-09-22 (×4): 1 via ORAL
  Filled 2020-09-19 (×4): qty 1

## 2020-09-19 MED ORDER — VERAPAMIL HCL 2.5 MG/ML IV SOLN
INTRAVENOUS | Status: DC | PRN
  Administered 2020-09-19: 10 mL via INTRA_ARTERIAL

## 2020-09-19 MED ORDER — FENTANYL CITRATE (PF) 100 MCG/2ML IJ SOLN
INTRAMUSCULAR | Status: AC
Start: 2020-09-19 — End: ?
  Filled 2020-09-19: qty 2

## 2020-09-19 MED ORDER — ONDANSETRON HCL 4 MG/2ML IJ SOLN: 4.0000 mg | Freq: Four times a day (QID) | INTRAMUSCULAR | Status: DC | PRN

## 2020-09-19 MED ORDER — HEPARIN (PORCINE) IN NACL 1000-0.9 UT/500ML-% IV SOLN
INTRAVENOUS | Status: DC | PRN
  Administered 2020-09-19 (×2): 500 mL

## 2020-09-19 MED ORDER — HEPARIN SODIUM (PORCINE) 1000 UNIT/ML IJ SOLN
INTRAMUSCULAR | Status: AC
  Filled 2020-09-19: qty 1

## 2020-09-19 MED ORDER — MIDAZOLAM HCL 2 MG/2ML IJ SOLN
INTRAMUSCULAR | Status: AC
  Filled 2020-09-19: qty 2

## 2020-09-19 MED ORDER — INSULIN DETEMIR 100 UNIT/ML FLEXPEN: 25.0000 [IU] | PEN_INJECTOR | Freq: Every day | SUBCUTANEOUS | Status: DC

## 2020-09-19 MED ORDER — IOHEXOL 350 MG/ML SOLN
INTRAVENOUS | Status: DC | PRN
  Administered 2020-09-19: 75 mL

## 2020-09-19 MED ORDER — VERAPAMIL HCL 2.5 MG/ML IV SOLN
INTRAVENOUS | Status: AC
  Filled 2020-09-19: qty 2

## 2020-09-19 MED ORDER — HEPARIN SODIUM (PORCINE) 1000 UNIT/ML IJ SOLN
INTRAMUSCULAR | Status: DC | PRN
  Administered 2020-09-19: 5000 [IU] via INTRAVENOUS
  Administered 2020-09-19: 6000 [IU] via INTRAVENOUS

## 2020-09-19 MED ORDER — INSULIN DETEMIR 100 UNIT/ML ~~LOC~~ SOLN: 25.0000 [IU] | Freq: Every day | SUBCUTANEOUS | Status: DC

## 2020-09-19 MED ORDER — LIDOCAINE HCL (PF) 1 % IJ SOLN
INTRAMUSCULAR | Status: AC
  Filled 2020-09-19: qty 30

## 2020-09-19 MED ORDER — INSULIN DETEMIR 100 UNIT/ML ~~LOC~~ SOLN
25.0000 [IU] | Freq: Every day | SUBCUTANEOUS | Status: DC
  Administered 2020-09-19 – 2020-09-20 (×2): 25 [IU] via SUBCUTANEOUS
  Filled 2020-09-19 (×4): qty 0.25

## 2020-09-19 MED ORDER — SODIUM CHLORIDE 0.9% FLUSH
3.0000 mL | Freq: Two times a day (BID) | INTRAVENOUS | Status: DC
  Administered 2020-09-20 – 2020-09-22 (×4): 3 mL via INTRAVENOUS

## 2020-09-19 MED ORDER — LIDOCAINE HCL (PF) 1 % IJ SOLN
INTRAMUSCULAR | Status: DC | PRN
  Administered 2020-09-19: 2 mL

## 2020-09-19 MED ORDER — SODIUM CHLORIDE 0.9 % IV SOLN: INTRAVENOUS | Status: AC

## 2020-09-19 MED ORDER — ASPIRIN 81 MG PO CHEW
324.0000 mg | CHEWABLE_TABLET | Freq: Once | ORAL | Status: AC
  Administered 2020-09-19: 324 mg via ORAL
  Filled 2020-09-19: qty 4

## 2020-09-19 MED ORDER — METOPROLOL SUCCINATE ER 25 MG PO TB24
25.0000 mg | ORAL_TABLET | Freq: Every day | ORAL | Status: DC
  Administered 2020-09-19 – 2020-09-21 (×3): 25 mg via ORAL
  Filled 2020-09-19 (×3): qty 1

## 2020-09-19 MED ORDER — VITAMIN D 25 MCG (1000 UNIT) PO TABS
1000.0000 [IU] | ORAL_TABLET | Freq: Every day | ORAL | Status: DC
  Administered 2020-09-19 – 2020-09-22 (×4): 1000 [IU] via ORAL
  Filled 2020-09-19 (×4): qty 1

## 2020-09-19 MED ORDER — INSULIN ASPART 100 UNIT/ML ~~LOC~~ SOLN
0.0000 [IU] | Freq: Three times a day (TID) | SUBCUTANEOUS | Status: DC
  Administered 2020-09-20: 7 [IU] via SUBCUTANEOUS
  Administered 2020-09-20: 11 [IU] via SUBCUTANEOUS
  Administered 2020-09-20 – 2020-09-21 (×2): 7 [IU] via SUBCUTANEOUS
  Administered 2020-09-21: 4 [IU] via SUBCUTANEOUS
  Administered 2020-09-21: 11 [IU] via SUBCUTANEOUS
  Administered 2020-09-22: 15 [IU] via SUBCUTANEOUS
  Administered 2020-09-22: 3 [IU] via SUBCUTANEOUS
  Administered 2020-09-22: 4 [IU] via SUBCUTANEOUS
  Administered 2020-09-23: 7 [IU] via SUBCUTANEOUS

## 2020-09-19 SURGICAL SUPPLY — 12 items
CATH 5FR JL3.5 JR4 ANG PIG MP (CATHETERS) ×2 IMPLANT
CATH VISTA GUIDE 6FR XBLAD3.5 (CATHETERS) ×2 IMPLANT
DEVICE RAD COMP TR BAND LRG (VASCULAR PRODUCTS) ×2 IMPLANT
GLIDESHEATH SLEND SS 6F .021 (SHEATH) ×2 IMPLANT
GUIDEWIRE INQWIRE 1.5J.035X260 (WIRE) ×2 IMPLANT
GUIDEWIRE PRESSURE COMET II (WIRE) ×2 IMPLANT
INQWIRE 1.5J .035X260CM (WIRE) ×4
KIT ENCORE 26 ADVANTAGE (KITS) ×2 IMPLANT
KIT HEART LEFT (KITS) ×2 IMPLANT
PACK CARDIAC CATHETERIZATION (CUSTOM PROCEDURE TRAY) ×2 IMPLANT
TRANSDUCER W/STOPCOCK (MISCELLANEOUS) ×2 IMPLANT
TUBING CIL FLEX 10 FLL-RA (TUBING) ×2 IMPLANT

## 2020-09-19 NOTE — H&P (Signed)
Cardiology Admission History and Physical:   Patient ID: [name] Gray MRN: [id number]; DOB: [birth year]   Admission date: 09/19/2020  Primary Care Provider: Rory Percy, MD [facility] Cardiologist: [place] Electrophysiologist:  None   Chief Complaint:  Chest pain  Patient Profile:   [name] Gray is a 64 y.o. male with history of HTN, HLD, DM on insulin, tobacco abuse and extensive coronary history presents with progressive chest pain  History of Present Illness:   [name] Gray has history of poorly controlled DM on insulin, HTN, HLD, PAD and extensive coronary history. He is s/p stenting of the distal RCA in 2003 with a 3.5 x 28 mm Zeta stent. In 2011 he had stenting of the mid RCA proximal to the prior stent with a Promus stent. He returned in 2012 with recurrent stenosis and had repeat stenting of the mid vessel with a Resolute stent. In May of 2021 he had extensive coronary work with DES of the proximal RCA with a 3.5 x 28 mm Synergy stent and had Cutting balloon angioplasty of the mid and distal RCA. Returned the following day for staged PCI of the proximal and mid LAD with atherectomy and placement of 3.0 x 38 and 3.5 x 16 mm Synergy stents. Since then he has done well. Reports he quit smoking but is vaping. Working 14-16 hours a day in [employer]. Over the past 2 weeks he notes progressive symptoms of chest pain and indigestion. Symptoms are worse after eating or with any activity. Relieved with sl Ntg. Associated with SOB. This am pain worsened and he took 3 sl Ntg with relief but pain returned. He states the pain is the same as before his stents.   His diabetes has been poorly controlled. He cannot remember his last A1c but it has been as high as 12. He hasn't eaten since last night.    Past Medical History:  Diagnosis Date  . Diabetes mellitus   . Hyperlipidemia   . Hypertension   . Presence of stent in coronary artery   . S/P insertion of  iliac artery stent     Past Surgical History:  Procedure Laterality Date  . CORONARY ATHERECTOMY N/A 04/04/2020   Procedure: CORONARY ATHERECTOMY;  Surgeon: Jettie Booze, MD;  Location: [place] [facility];  Service: Cardiovascular;  Laterality: N/A;  . CORONARY STENT INTERVENTION N/A 04/03/2020   Procedure: CORONARY STENT INTERVENTION;  Surgeon: Jettie Booze, MD;  Location: [place] [facility];  Service: Cardiovascular;  Laterality: N/A;  . INTRAVASCULAR ULTRASOUND/IVUS N/A 04/03/2020   Procedure: Intravascular Ultrasound/IVUS;  Surgeon: Jettie Booze, MD;  Location: [place] [facility];  Service: Cardiovascular;  Laterality: N/A;  . INTRAVASCULAR ULTRASOUND/IVUS N/A 04/04/2020   Procedure: Intravascular Ultrasound/IVUS;  Surgeon: Jettie Booze, MD;  Location: [place] [facility];  Service: Cardiovascular;  Laterality: N/A;  . LEFT HEART CATH AND CORONARY ANGIOGRAPHY N/A 04/03/2020   Procedure: LEFT HEART CATH AND CORONARY ANGIOGRAPHY;  Surgeon: Jettie Booze, MD;  Location: [place] [facility];  Service: Cardiovascular;  Laterality: N/A;  . LEFT HEART CATH AND CORONARY ANGIOGRAPHY N/A 04/04/2020   Procedure: LEFT HEART CATH AND CORONARY ANGIOGRAPHY;  Surgeon: Jettie Booze, MD;  Location: [place] [facility];  Service: Cardiovascular;  Laterality: N/A;     Medications Prior to Admission: Prior to Admission medications   Medication Sig Start Date End Date Taking? Authorizing Provider  Blood Glucose Monitoring Suppl (TRUE METRIX AIR GLUCOSE METER) w/Device KIT  USE AS DIRECTED 05/02/20   [name], PA  aspirin EC 81 MG tablet Take 81 mg by mouth daily.    [provider]  cholecalciferol (VITAMIN D3) 25 MCG (1000 UNIT) tablet Take 1,000 Units by mouth daily.    [provider]  clopidogrel (PLAVIX) 75 MG tablet Take 1 tablet (75 mg total) by mouth daily with breakfast. 04/06/20   [name], PA  Global Inject Ease Lancets 30G MISC USE FOUR TIMES  DAILY 05/02/20   [name], PA  insulin detemir (LEVEMIR FLEXTOUCH) 100 UNIT/ML FlexPen Inject 25 Units into the skin at bedtime. 04/05/20   [name], PA  Insulin Pen Needle 32G X 4 MM MISC 1 box of 100 each 04/05/20   [name], PA  isosorbide mononitrate (IMDUR) 30 MG 24 hr tablet Take 1 tablet (30 mg total) by mouth daily. 04/06/20   [name], PA  lisinopril-hydrochlorothiazide (PRINZIDE,ZESTORETIC) 10-12.5 MG per tablet Take 1 tablet by mouth daily.      [provider]  Melatonin 3 MG CAPS Take 6 mg by mouth at bedtime.    [provider]  metFORMIN (GLUCOPHAGE-XR) 500 MG 24 hr tablet Take 500 mg by mouth 2 (two) times daily. 03/19/20   [provider]  metoprolol succinate (TOPROL-XL) 25 MG 24 hr tablet Take 25 mg by mouth daily.      [provider]  Multiple Vitamin (MULTIVITAMIN WITH MINERALS) TABS tablet Take 1 tablet by mouth daily.    [provider]  nitroGLYCERIN (NITROSTAT) 0.4 MG SL tablet Place 0.4 mg under the tongue every 5 (five) minutes as needed for chest pain.    [provider]  rosuvastatin (CRESTOR) 20 MG tablet Take 1 tablet (20 mg total) by mouth daily. 07/03/20 10/01/20  [name], MD  Semaglutide,0.25 or 0.5MG/DOS, (OZEMPIC, 0.25 OR 0.5 MG/DOSE,) 2 MG/1.5ML SOPN Inject 0.5 mg into the skin every Friday.    [provider]     Allergies:   No Known Allergies  Social History:   Social History   Socioeconomic History  . Marital status: Married    Spouse name: Not on file  . Number of children: Not on file  . Years of education: Not on file  . Highest education level: Not on file  Occupational History  . Not on file  Tobacco Use  . Smoking status: Former Smoker    Types: Cigarettes    Quit date: 04/02/2020    Years since quitting: 0.4  . Smokeless tobacco: Former Network engineer and Sexual Activity  . Alcohol use: Yes  . Drug use: No  . Sexual activity: Not on file  Other Topics Concern    . Not on file  Social History Narrative  . Not on file   Social Determinants of Health   Financial Resource Strain:   . Difficulty of Paying Living Expenses: Not on file  Food Insecurity:   . Worried About Charity fundraiser in the Last Year: Not on file  . Ran Out of Food in the Last Year: Not on file  Transportation Needs:   . Lack of Transportation (Medical): Not on file  . Lack of Transportation (Non-Medical): Not on file  Physical Activity:   . Days of Exercise per Week: Not on file  . Minutes of Exercise per Session: Not on file  Stress:   . Feeling of Stress : Not on file  Social Connections:   . Frequency of Communication with Friends  and Family: Not on file  . Frequency of Social Gatherings with Friends and Family: Not on file  . Attends Religious Services: Not on file  . Active Member of Clubs or Organizations: Not on file  . Attends Archivist Meetings: Not on file  . Marital Status: Not on file  Intimate Partner Violence:   . Fear of Current or Ex-Partner: Not on file  . Emotionally Abused: Not on file  . Physically Abused: Not on file  . Sexually Abused: Not on file    Family History:   No family history of premature CAD  ROS:  Please see the history of present illness.  All other ROS reviewed and negative.     Physical Exam/Data:   Vitals:   09/19/20 0939 09/19/20 1130 09/19/20 1200  BP: (!) 168/109 (!) 143/75 (!) 143/85  Pulse: 78 78 72  Resp: _0 Temp: 97.9 F (36.6 C)    TempSrc: Oral    SpO2: 98% 95% 95%  Weight: 104.3 kg    Height: _1  (1.778 m)     No intake or output data in the 24 hours ending 09/19/20 1316 Last 3 Weights 09/19/2020 04/16/2020 04/05/2020  Weight (lbs) 230 lb 227 lb 9.6 oz 219 lb 12.8 oz  Weight (kg) 104.327 kg 103.239 kg 99.701 kg     Body mass index is 33 kg/m.  General:  Well nourished, obese, in no acute distress HEENT: normal Lymph: no adenopathy Neck: no JVD Endocrine:  No  thryomegaly Vascular: No carotid bruits; FA pulses 2+ bilaterally without bruits  Cardiac:  normal S1, S2; RRR; no murmur  Lungs:  clear to auscultation bilaterally, no wheezing, rhonchi or rales  Abd: soft, nontender, no hepatomegaly  Ext: no edema Musculoskeletal:  No deformities, BUE and BLE strength normal and equal Skin: warm and dry  Neuro:  CNs 2-12 intact, no focal abnormalities noted Psych:  Normal affect    EKG:  The ECG that was done today was personally reviewed and demonstrates NSR rate 70. Old lateral infarct. Mild ST depression in inferior leads. I have personally reviewed and interpreted this study.   Relevant CV Studies: CORONARY STENT INTERVENTION  Intravascular Ultrasound/IVUS  LEFT HEART CATH AND CORONARY ANGIOGRAPHY  Conclusion    Prox RCA lesion is 80% stenosed. A drug-eluting stent was successfully placed using a SYNERGY XD 3.50X20, postdilated to 3.8 mm. Stent size was optimized with IVUS.  Post intervention, there is a 0% residual stenosis.  Mid RCA to Dist RCA lesion is 80% stenosed. THis was in stent restenosis noted by IVUS. Scoring balloon angioplasty was performed using a BALLOON WOLVERINE 3.50X10.  Post intervention, there is a 20% residual stenosis.  Dist RCA lesion is 10% stenosed.  Mid LAD lesion is 80% stenosed. Long, heavily calcified lesion.  The left ventricular systolic function is normal.  LV end diastolic pressure is normal.  The left ventricular ejection fraction is 55-65% by visual estimate.  There is no aortic valve stenosis.  A drug-eluting stent was successfully placed using a SYNERGY XD 3.50X20.   Will plan to bring the patient back for orbital atherectomy of the LAD, tomorrow.  Continue DAPT with aggressive secondary prevention.   I conveyed the results and plans for orbital atherectomy, to his wife Vaughan Basta,  CORONARY ATHERECTOMY  Intravascular Ultrasound/IVUS  LEFT HEART CATH AND CORONARY ANGIOGRAPHY   Conclusion    Prox RCA to Mid RCA lesion is 25% stenosed.  Dist RCA lesion is 10% stenosed.  Mid RCA to Dist RCA lesion is 20% stenosed.  Previously placed Prox RCA drug eluting stent is widely patent. PTCA site is widely patent.  Mid LAD lesion is 80% stenosed. This was treated with atherectomy. A drug-eluting stent was successfully placed using a SYNERGY XD 3.0X38, postdilated to 3.75 mm.  Post intervention, there is a 0% residual stenosis.  Prox LAD lesion is 70% stenosed. This was treated with atherectomy at high speed. A drug-eluting stent was successfully placed overlapping the above stent, using a SYNERGY XD 3.50X16, postdilated to 3.8 mm.  Post intervention, there is a 0% residual stenosis.  LV end diastolic pressure is normal.  There is no aortic valve stenosis.  Slow flow in jailed diagonal vessel, but improving with time.   Successful PCI of LAD.  Transient diagonal occlusion with stent placement.  Will see how his chest pain does.  Post procedure ECG showed some subtle lateral ST changes likely related to diagonal stenosis.   Information conveyed to his wife.  Aggressive secondary prevention including smoking cessation.   Will start IV NTG to help with BP and help improve flow in diagonal vessel.     Laboratory Data:  High Sensitivity Troponin:   Recent Labs  Lab 09/19/20 0946 09/19/20 1113  TROPONINIHS 23* 27*      Chemistry Recent Labs  Lab 09/19/20 0946  NA 137  K 3.6  CL 101  CO2 25  GLUCOSE 224*  BUN 12  CREATININE 0.90  CALCIUM 9.6  GFRNONAA >60  ANIONGAP 11    Recent Labs  Lab 09/19/20 1113  PROT 7.5  ALBUMIN 4.0  AST 19  ALT 21  ALKPHOS 40  BILITOT 0.8   Hematology Recent Labs  Lab 09/19/20 0946  WBC 9.1  RBC 5.16  HGB 16.8  HCT 49.4  MCV 95.7  MCH 32.6  MCHC 34.0  RDW 12.2  PLT 172   BNPNo results for input(s): BNP, PROBNP in the last 168 hours.  DDimer No results for input(s): DDIMER in the last 168  hours.   Radiology/Studies:  DG Chest 2 View  Result Date: 09/19/2020 CLINICAL DATA:  Chest pain and shortness of breath EXAM: CHEST - 2 VIEW COMPARISON:  July 26, 2011 FINDINGS: There is no edema or airspace opacity. Heart size and pulmonary vascularity are normal. There is aortic atherosclerosis. No adenopathy. No bone lesions. No pneumothorax. IMPRESSION: Lungs clear.  Cardiac silhouette normal. Aortic Atherosclerosis (ICD10-I70.0). Electronically Signed   By: Lowella Grip III M.D.   On: 09/19/2020 10:24     Assessment and Plan:   1. Unstable angina. Patient has classic anginal symptoms similar to prior presentations. No delta troponin. Slight ST depression on Ecg. He is at high risk for restenosis given extensive coronary interventions in the past- last in May 2021. Will admit to telemetry. Start IV heparin. Continue beta blocker, statin, and DAPT with ASA and Plavix. Plan cardiac cath today. Keep NPO. The procedure and risks were reviewed including but not limited to death, myocardial infarction, stroke, arrythmias, bleeding, transfusion, emergency surgery, dye allergy, or renal dysfunction. The patient voices understanding and is agreeable to proceed. 2. DM type 2 on insulin with poor control. Hold metformin. Continue Levemir and SSI. Check A1c 3. HLD. Continue Crestor. Check lipid panel 4. Obesity 5. Tobacco abuse. Not smoking cigarettes now but still vaping.  6. HTN. 7. PAD.       TIMI Risk Score for Unstable Angina or Non-ST Elevation MI:   The patient's TIMI risk  score is 5, which indicates a 26% risk of all cause mortality, new or recurrent myocardial infarction or need for urgent revascularization in the next 14 days.      Severity of Illness: The appropriate patient status for this patient is INPATIENT. Inpatient status is judged to be reasonable and necessary in order to provide the required intensity of service to ensure the patient's safety. The patient's  presenting symptoms, physical exam findings, and initial radiographic and laboratory data in the context of their chronic comorbidities is felt to place them at high risk for further clinical deterioration. Furthermore, it is not anticipated that the patient will be medically stable for discharge from the hospital within 2 midnights of admission. The following factors support the patient status of inpatient.   " The patient's presenting symptoms include worsening chest pain. " The worrisome physical exam findings include none. " The initial radiographic and laboratory data are worrisome because of mildly elevated troponin, ST depression on Ecg. " The chronic co-morbidities include DM on insulin, HLD, HTN, tobacco use, PAD.   * I certify that at the point of admission it is my clinical judgment that the patient will require inpatient hospital care spanning beyond 2 midnights from the point of admission due to high intensity of service, high risk for further deterioration and high frequency of surveillance required.*    For questions or updates, please contact [place] Please consult [email or web address] for contact info under     Signed, Emalina Dubreuil [place], MD  09/19/2020 1:16 PM

## 2020-09-19 NOTE — ED Triage Notes (Signed)
Pt here with co CP that started 2 weeks ago but got worse last night into this morning. Hx of stent placement 2 months ago. Chest hurts in the center left and into the left arm. Pt co SOB when the pain increases. Pt took 3-4 nitro tabs before arrival.

## 2020-09-19 NOTE — Interval H&P Note (Signed)
History and Physical Interval Note:  09/19/2020 4:31 PM  Florene Glen  has presented today for surgery, with the diagnosis of Chest pain.  The various methods of treatment have been discussed with the patient and family. After consideration of risks, benefits and other options for treatment, the patient has consented to  Procedure(s): LEFT HEART CATH AND CORONARY ANGIOGRAPHY (N/A) as a surgical intervention.  The patient's history has been reviewed, patient examined, no change in status, stable for surgery.  I have reviewed the patient's chart and labs.  Questions were answered to the patient's satisfaction.    Cath Lab Visit (complete for each Cath Lab visit)  Clinical Evaluation Leading to the Procedure:   ACS: No.  Non-ACS:    Anginal Classification: CCS III  Anti-ischemic medical therapy: Maximal Therapy (2 or more classes of medications)  Non-Invasive Test Results: No non-invasive testing performed  Prior CABG: No previous CABG        Verne Carrow

## 2020-09-19 NOTE — Procedures (Signed)
Echo attempted. Patient about to go to cath lab according to cath lab. Will attempt later.

## 2020-09-19 NOTE — ED Provider Notes (Signed)
[facility] EMERGENCY DEPARTMENT Provider Note   CSN: [id number] Arrival date & time: 09/19/20  9326     History Chief Complaint  Patient presents with  . Chest Pain    [name] Gray is a 65 y.o. male.  The history is provided by the patient and medical records. No language interpreter was used.  Chest Pain Pain location:  Substernal area and L chest Pain quality: aching and pressure   Pain radiates to:  L shoulder and L arm Pain severity:  Severe Onset quality:  Gradual Duration:  3 days (3 days aworseningf) Timing:  Intermittent Progression:  Resolved Chronicity:  Recurrent Relieved by:  Nothing Worsened by:  Exertion Ineffective treatments:  None tried Associated symptoms: fatigue, heartburn and shortness of breath   Associated symptoms: no abdominal pain, no altered mental status, no back pain, no claudication, no cough, no diaphoresis, no dizziness, no fever, no headache, no lower extremity edema, no nausea, no near-syncope, no palpitations and no vomiting   Risk factors: coronary artery disease, hypertension and male sex        Past Medical History:  Diagnosis Date  . Diabetes mellitus   . Hyperlipidemia   . Hypertension   . Presence of stent in coronary artery   . S/P insertion of iliac artery stent     Patient Active Problem List   Diagnosis Date Noted  . Angina pectoris ([place]) 04/03/2020  . DM (diabetes mellitus) type II uncontrolled, periph vascular disorder ([place]) 01/22/2010  . Hyperlipidemia LDL goal <70 01/22/2010  . OBESITY, MORBID 01/22/2010  . Essential hypertension, benign 01/22/2010  . CORONARY ATHEROSCLEROSIS NATIVE CORONARY ARTERY 01/22/2010  . PERIPHERAL VASCULAR DISEASE 01/22/2010    Past Surgical History:  Procedure Laterality Date  . CORONARY ATHERECTOMY N/A 04/04/2020   Procedure: CORONARY ATHERECTOMY;  Surgeon: Jettie Booze, MD;  Location: [place] [facility];  Service: Cardiovascular;  Laterality: N/A;  .  CORONARY STENT INTERVENTION N/A 04/03/2020   Procedure: CORONARY STENT INTERVENTION;  Surgeon: Jettie Booze, MD;  Location: [place] [facility];  Service: Cardiovascular;  Laterality: N/A;  . INTRAVASCULAR ULTRASOUND/IVUS N/A 04/03/2020   Procedure: Intravascular Ultrasound/IVUS;  Surgeon: Jettie Booze, MD;  Location: [place] [facility];  Service: Cardiovascular;  Laterality: N/A;  . INTRAVASCULAR ULTRASOUND/IVUS N/A 04/04/2020   Procedure: Intravascular Ultrasound/IVUS;  Surgeon: Jettie Booze, MD;  Location: [place] [facility];  Service: Cardiovascular;  Laterality: N/A;  . LEFT HEART CATH AND CORONARY ANGIOGRAPHY N/A 04/03/2020   Procedure: LEFT HEART CATH AND CORONARY ANGIOGRAPHY;  Surgeon: Jettie Booze, MD;  Location: [place] [facility];  Service: Cardiovascular;  Laterality: N/A;  . LEFT HEART CATH AND CORONARY ANGIOGRAPHY N/A 04/04/2020   Procedure: LEFT HEART CATH AND CORONARY ANGIOGRAPHY;  Surgeon: Jettie Booze, MD;  Location: [place] [facility];  Service: Cardiovascular;  Laterality: N/A;       No family history on file.  Social History   Tobacco Use  . Smoking status: Former Smoker    Types: Cigarettes    Quit date: 04/02/2020    Years since quitting: 0.4  . Smokeless tobacco: Former Network engineer Use Topics  . Alcohol use: Yes  . Drug use: No    Home Medications Prior to Admission medications   Medication Sig Start Date End Date Taking? Authorizing Provider  Blood Glucose Monitoring Suppl (TRUE METRIX AIR GLUCOSE METER) w/Device KIT USE AS DIRECTED 05/02/20   [name], PA  aspirin EC 81  MG tablet Take 81 mg by mouth daily.    [provider]  cholecalciferol (VITAMIN D3) 25 MCG (1000 UNIT) tablet Take 1,000 Units by mouth daily.    [provider]  clopidogrel (PLAVIX) 75 MG tablet Take 1 tablet (75 mg total) by mouth daily with breakfast. 04/06/20   [name], PA  Global Inject Ease Lancets 30G MISC USE FOUR TIMES  DAILY 05/02/20   [name], PA  insulin detemir (LEVEMIR FLEXTOUCH) 100 UNIT/ML FlexPen Inject 25 Units into the skin at bedtime. 04/05/20   [name], PA  Insulin Pen Needle 32G X 4 MM MISC 1 box of 100 each 04/05/20   [name], PA  isosorbide mononitrate (IMDUR) 30 MG 24 hr tablet Take 1 tablet (30 mg total) by mouth daily. 04/06/20   [name], PA  lisinopril-hydrochlorothiazide (PRINZIDE,ZESTORETIC) 10-12.5 MG per tablet Take 1 tablet by mouth daily.      [provider]  Melatonin 3 MG CAPS Take 6 mg by mouth at bedtime.    [provider]  metFORMIN (GLUCOPHAGE-XR) 500 MG 24 hr tablet Take 500 mg by mouth 2 (two) times daily. 03/19/20   [provider]  metoprolol succinate (TOPROL-XL) 25 MG 24 hr tablet Take 25 mg by mouth daily.      [provider]  Multiple Vitamin (MULTIVITAMIN WITH MINERALS) TABS tablet Take 1 tablet by mouth daily.    [provider]  nitroGLYCERIN (NITROSTAT) 0.4 MG SL tablet Place 0.4 mg under the tongue every 5 (five) minutes as needed for chest pain.    [provider]  rosuvastatin (CRESTOR) 20 MG tablet Take 1 tablet (20 mg total) by mouth daily. 07/03/20 10/01/20  [name], MD  Semaglutide,0.25 or 0.5MG /DOS, (OZEMPIC, 0.25 OR 0.5 MG/DOSE,) 2 MG/1.5ML SOPN Inject 0.5 mg into the skin every Friday.    [provider]    Allergies    Patient has no known allergies.  Review of Systems   Review of Systems  Constitutional: Positive for fatigue. Negative for chills, diaphoresis and fever.  HENT: Negative for congestion.   Eyes: Negative for visual disturbance.  Respiratory: Positive for shortness of breath. Negative for cough, chest tightness and wheezing.   Cardiovascular: Positive for chest pain. Negative for palpitations, claudication, leg swelling and near-syncope.  Gastrointestinal: Positive for constipation and heartburn. Negative for abdominal pain, diarrhea, nausea and vomiting.    Genitourinary: Negative for flank pain and frequency.  Musculoskeletal: Negative for back pain, neck pain and neck stiffness.  Skin: Negative for rash and wound.  Neurological: Negative for dizziness, light-headedness and headaches.  Psychiatric/Behavioral: Negative for agitation.  All other systems reviewed and are negative.   Physical Exam Updated Vital Signs BP (!) 168/109   Pulse 78   Temp 97.9 F (36.6 C) (Oral)   Resp 18   Ht 5\' 10"  (1.778 m)   Wt 104.3 kg   SpO2 98%   BMI 33.00 kg/m   Physical Exam Vitals and nursing note reviewed.  Constitutional:      General: He is not in acute distress.    Appearance: He is well-developed. He is not ill-appearing, toxic-appearing or diaphoretic.  HENT:     Head: Normocephalic and atraumatic.  Eyes:     Conjunctiva/sclera: Conjunctivae normal.     Pupils: Pupils are equal, round, and reactive to light.  Cardiovascular:     Rate and Rhythm: Normal rate and regular rhythm.     Heart sounds: Normal heart sounds. No  murmur heard.   Pulmonary:     Effort: Pulmonary effort is normal. No tachypnea or respiratory distress.     Breath sounds: Normal breath sounds. No decreased breath sounds, wheezing or rhonchi.  Abdominal:     Palpations: Abdomen is soft.     Tenderness: There is no abdominal tenderness.  Musculoskeletal:     Cervical back: Neck supple.     Right lower leg: No tenderness. No edema.     Left lower leg: No tenderness. No edema.  Skin:    General: Skin is warm and dry.     Capillary Refill: Capillary refill takes less than 2 seconds.  Neurological:     General: No focal deficit present.     Mental Status: He is alert.  Psychiatric:        Mood and Affect: Mood normal. Mood is not anxious.     ED Results / Procedures / Treatments   Labs (all labs ordered are listed, but only abnormal results are displayed) Labs Reviewed  BASIC METABOLIC PANEL - Abnormal; Notable for the following components:      Result  Value   Glucose, Bld 224 (*)    All other components within normal limits  TROPONIN I (HIGH SENSITIVITY) - Abnormal; Notable for the following components:   Troponin I (High Sensitivity) 23 (*)    All other components within normal limits  TROPONIN I (HIGH SENSITIVITY) - Abnormal; Notable for the following components:   Troponin I (High Sensitivity) 27 (*)    All other components within normal limits  TROPONIN I (HIGH SENSITIVITY) - Abnormal; Notable for the following components:   Troponin I (High Sensitivity) 29 (*)    All other components within normal limits  RESPIRATORY PANEL BY RT PCR (FLU A&B, COVID)  CBC  HEPATIC FUNCTION PANEL  LIPASE, BLOOD  HEPARIN LEVEL (UNFRACTIONATED)  TROPONIN I (HIGH SENSITIVITY)    EKG EKG Interpretation  Date/Time:  Friday September 19 2020 09:38:48 EDT Ventricular Rate:  70 PR Interval:  198 QRS Duration: 94 QT Interval:  400 QTC Calculation: 432 R Axis:   64 Text Interpretation: Normal sinus rhythm Lateral infarct , age undetermined Abnormal ECG Inferior depression mild Confirmed by Elnora Morrison [phone]) on 09/19/2020 9:45:12 AM   Radiology DG Chest 2 View  Result Date: 09/19/2020 CLINICAL DATA:  Chest pain and shortness of breath EXAM: CHEST - 2 VIEW COMPARISON:  July 26, 2011 FINDINGS: There is no edema or airspace opacity. Heart size and pulmonary vascularity are normal. There is aortic atherosclerosis. No adenopathy. No bone lesions. No pneumothorax. IMPRESSION: Lungs clear.  Cardiac silhouette normal. Aortic Atherosclerosis (ICD10-I70.0). Electronically Signed   By: Lowella Grip III M.D.   On: 09/19/2020 10:24    Procedures Procedures (including critical care time)  CRITICAL CARE Performed by: Gwenyth Allegra Gabbi Whetstone Total critical care time: 30 minutes Critical care time was exclusive of separately billable procedures and treating other patients. Critical care was necessary to treat or prevent imminent or life-threatening  deterioration. Critical care was time spent personally by me on the following activities: development of treatment plan with patient and/or surrogate as well as nursing, discussions with consultants, evaluation of patient's response to treatment, examination of patient, obtaining history from patient or surrogate, ordering and performing treatments and interventions, ordering and review of laboratory studies, ordering and review of radiographic studies, pulse oximetry and re-evaluation of patient's condition.   Medications Ordered in ED Medications  nitroGLYCERIN (NITROSTAT) SL tablet 0.4 mg (has no administration in  time range)  nitroGLYCERIN (NITROSTAT) SL tablet 0.4 mg (has no administration in time range)  ondansetron (ZOFRAN) injection 4 mg (has no administration in time range)  0.9 %  sodium chloride infusion ( Intravenous New Bag/Given 09/19/20 1356)  heparin ADULT infusion 100 units/mL (25000 units/254mL sodium chloride 0.45%) (1,200 Units/hr Intravenous New Bag/Given 09/19/20 1506)  aspirin chewable tablet 324 mg (324 mg Oral Given 09/19/20 1129)  heparin bolus via infusion 4,000 Units (4,000 Units Intravenous Bolus from Bag 09/19/20 1506)    ED Course  I have reviewed the triage vital signs and the nursing notes.  Pertinent labs & imaging results that were available during my care of the patient were reviewed by me and considered in my medical decision making (see chart for details).    MDM Rules/Calculators/A&P                          EMANUAL LAMOUNTAIN is a 64 y.o. male with a past medical history significant for CAD status post PCI, hypertension, hyperlipidemia, diabetes, and peripheral vascular disease who presents with chest pain.  He reports that for the last 2 weeks he has had on and off chest discomfort that initially thought was more of a reflux.  He says that over the last several days, it started to change and is now a central chest pressure the raise to his left arm and  left shoulder.  He does have a shortness of breath with it and reports it is very exertional.  He says this feels similar to his last MI where he had to have cardiac stenting.  He says that he does not feel like reflux anymore.  He says that he has not had diaphoresis nausea or vomiting and has had some mild constipation but had a normal bowel movement yesterday.  He denies any trauma.  Denies fevers, chills, ingestion, or cough.  Denies any leg pain or leg swelling.  Denies syncope.  He reports taking several nitroglycerin this morning that relieved the discomfort and is currently chest pain-free.  He reports the pain at its worst was an 8 or 9 out of 10.  Due to this evolution of the discomfort and persistence, he presents for evaluation.  He has not contacted his cardiology team for intervention or management before presenting today.  On exam, lungs are clear and chest is nontender, cannot reproduce the discomfort.  No murmur.  Abdomen nontender.  Good pulses in extremities.  Legs nontender nonedematous.  Patient is resting comfortably with no diaphoresis and no discomfort initially.  EKG shows no STEMI.  Patient had screening labs including troponin and chest x-ray.  We will get a Covid test ordered.  Anticipate touching base with cardiology regardless of the work-up results given his reported similar to prior MI and his concerning chest pain.  Initial troponins were elevated but not significantly rising.  Cardiology was called who will come see patient.  Anticipate admission by cardiology for high risk chest pain.  Cardiology will admit and started him on heparin.  Anticipate him getting a heart catheterization today based on their documentation.     Final Clinical Impression(s) / ED Diagnoses Final diagnoses:  Precordial pain   Clinical Impression: 1. Precordial pain     Disposition: Admit  This note was prepared with assistance of Dragon voice recognition software. Occasional wrong-word  or sound-a-like substitutions may have occurred due to the inherent limitations of voice recognition software.  Tiran Sauseda, Gwenyth Allegra, MD 09/19/20 [phone]

## 2020-09-19 NOTE — Progress Notes (Signed)
ANTICOAGULATION CONSULT NOTE - Initial Consult  Pharmacy Consult for heparin gtt Indication: chest pain/ACS  No Known Allergies  Patient Measurements: Height: 5\' 10"  (177.8 cm) Weight: 104.3 kg (230 lb) IBW/kg (Calculated) : 73 Heparin Dosing Weight: 95.2kg  Vital Signs: Temp: 97.9 F (36.6 C) (10/29 0939) Temp Source: Oral (10/29 0939) BP: 134/90 (10/29 1300) Pulse Rate: 74 (10/29 1300)  Labs: Recent Labs    09/19/20 0946 09/19/20 1113  HGB 16.8  --   HCT 49.4  --   PLT 172  --   CREATININE 0.90  --   TROPONINIHS 23* 27*    Estimated Creatinine Clearance: 102.9 mL/min (by C-G formula based on SCr of 0.9 mg/dL).   Medical History: Past Medical History:  Diagnosis Date  . Diabetes mellitus   . Hyperlipidemia   . Hypertension   . Presence of stent in coronary artery   . S/P insertion of iliac artery stent     Assessment: 64 year old male presenting with unstable angina per cardiology. They are planning for cardiac cath today. Pharmacy consulted to dose heparin gtt. No report of anticoagulants PTA on chart review or fill history  CBC Hgb 16.8; plt 172   Goal of Therapy:  Heparin level 0.3-0.7 units/ml Monitor platelets by anticoagulation protocol: Yes   Plan:  Heparin 4000 units bolus x1  Heparin infusion 1200 units/hr 6h HL at 2000  Monitor s/sx bleeding, HL, CBC, and clinical progress/surg plans  70  PGY1 Pharmacy Resident 09/19/2020,1:43 PM

## 2020-09-19 NOTE — Progress Notes (Signed)
ANTICOAGULATION CONSULT NOTE - Follow Up Consult  Pharmacy Consult for Heparin Indication: chest pain/ACS/ CAD  No Known Allergies  Patient Measurements: Height: 5\' 10"  (177.8 cm) Weight: 104.3 kg (230 lb) IBW/kg (Calculated) : 73 Heparin Dosing Weight: 95.2 kg  Vital Signs: Temp: 98.4 F (36.9 C) (10/29 1847) Temp Source: Oral (10/29 1847) BP: 158/88 (10/29 1847) Pulse Rate: 75 (10/29 1847)  Labs: Recent Labs    09/19/20 0946 09/19/20 1113 09/19/20 1348  HGB 16.8  --   --   HCT 49.4  --   --   PLT 172  --   --   CREATININE 0.90  --   --   TROPONINIHS 23* 27* 29*    Estimated Creatinine Clearance: 102.9 mL/min (by C-G formula based on SCr of 0.9 mg/dL).  Assessment:  65 year old male presented 09/19/20 with unstable angina. Hx PCI and LAD stent in May 2021.  Pharmacy consulted to dose heparin gtt. No report of anticoagulants PTA.     Went for cardiac cath this afternoon > noted progression of disease. Plavix stopped, last dose 10/28 PTA.  Plan TCTS consult.  Heparin drip to resume 8 hrs after TR band removed.  RN reports TR band off at 21:30, no bleeding issues.    Goal of Therapy:  Heparin level 0.3-0.7 units/ml Monitor platelets by anticoagulation protocol: Yes   Plan:   Resume heparin drip at 5:30am on 10/30 at 1200 units/hr  Heparin level ~8 hrs after drip resumed.  Daily heparin level and CBC while on heparin.  Follow up TCTS consult and plans.  11/30, RPh Phone: [phone] 09/19/2020,9:46 PM

## 2020-09-19 NOTE — Progress Notes (Signed)
Inpatient Diabetes Program Recommendations  AACE/ADA: New Consensus Statement on Inpatient Glycemic Control (2015)  Target Ranges:  Prepandial:   less than 140 mg/dL      Peak postprandial:   less than 180 mg/dL (1-2 hours)      Critically ill patients:  140 - 180 mg/dL   Lab Results  Component Value Date   GLUCAP 248 (H) 04/05/2020   HGBA1C 10.9 (H) 04/03/2020    Review of Glycemic Control Results for [name] Gray, [name] Gray (MRN [id number]) as of 09/19/2020 13:14  Ref. Range 09/19/2020 09:46  Glucose Latest Ref Range: 70 - 99 mg/dL 003 (H)   Diabetes history: DM 2 Outpatient Diabetes medications: Semaglutide 0.5 mg weekly on Friday, Metformin 500 mg bid, Levemir 25 units ordered unsure if pt taking Current orders for Inpatient glycemic control: None in ED  Inpatient Diabetes Program Recommendations:    - Consider CBGs and Novolog 0-15 units Q4 hours while NPO  Thanks,  Christena Deem RN, MSN, BC-ADM Inpatient Diabetes Coordinator Team Pager [phone] (8a-5p)

## 2020-09-20 ENCOUNTER — Inpatient Hospital Stay (HOSPITAL_COMMUNITY): Payer: BC Managed Care – PPO

## 2020-09-20 DIAGNOSIS — I10 Essential (primary) hypertension: Secondary | ICD-10-CM | POA: Diagnosis not present

## 2020-09-20 DIAGNOSIS — Z01810 Encounter for preprocedural cardiovascular examination: Secondary | ICD-10-CM | POA: Diagnosis not present

## 2020-09-20 DIAGNOSIS — E1165 Type 2 diabetes mellitus with hyperglycemia: Secondary | ICD-10-CM

## 2020-09-20 DIAGNOSIS — I2510 Atherosclerotic heart disease of native coronary artery without angina pectoris: Secondary | ICD-10-CM

## 2020-09-20 DIAGNOSIS — E785 Hyperlipidemia, unspecified: Secondary | ICD-10-CM | POA: Diagnosis not present

## 2020-09-20 DIAGNOSIS — I25110 Atherosclerotic heart disease of native coronary artery with unstable angina pectoris: Secondary | ICD-10-CM

## 2020-09-20 DIAGNOSIS — Z7901 Long term (current) use of anticoagulants: Secondary | ICD-10-CM

## 2020-09-20 DIAGNOSIS — E1151 Type 2 diabetes mellitus with diabetic peripheral angiopathy without gangrene: Secondary | ICD-10-CM | POA: Diagnosis not present

## 2020-09-20 DIAGNOSIS — I200 Unstable angina: Secondary | ICD-10-CM | POA: Diagnosis not present

## 2020-09-20 LAB — GLUCOSE, CAPILLARY
Glucose-Capillary: 201 mg/dL — ABNORMAL HIGH (ref 70–99)
Glucose-Capillary: 247 mg/dL — ABNORMAL HIGH (ref 70–99)
Glucose-Capillary: 281 mg/dL — ABNORMAL HIGH (ref 70–99)

## 2020-09-20 LAB — URINALYSIS, ROUTINE W REFLEX MICROSCOPIC
Bacteria, UA: NONE SEEN
Bilirubin Urine: NEGATIVE
Glucose, UA: >=500 mg/dL — AB
Hgb urine dipstick: NEGATIVE
Ketones, ur: NEGATIVE mg/dL
Leukocytes,Ua: NEGATIVE
Nitrite: NEGATIVE
Protein, ur: NEGATIVE mg/dL
Specific Gravity, Urine: 1.02 (ref 1.005–1.030)
pH: 6 (ref 5.0–8.0)

## 2020-09-20 LAB — CBC
HCT: 41.9 % (ref 39.0–52.0)
Hemoglobin: 14.8 g/dL (ref 13.0–17.0)
MCH: 33.2 pg (ref 26.0–34.0)
MCHC: 35.3 g/dL (ref 30.0–36.0)
MCV: 93.9 fL (ref 80.0–100.0)
Platelets: 148 K/uL — ABNORMAL LOW (ref 150–400)
RBC: 4.46 MIL/uL (ref 4.22–5.81)
RDW: 12.1 % (ref 11.5–15.5)
WBC: 8.4 K/uL (ref 4.0–10.5)
nRBC: 0 % (ref 0.0–0.2)

## 2020-09-20 LAB — SURGICAL PCR SCREEN
MRSA, PCR: NEGATIVE
Staphylococcus aureus: POSITIVE — AB

## 2020-09-20 LAB — ECHOCARDIOGRAM COMPLETE
Area-P 1/2: 3.08 cm2
Height: 70 in
S' Lateral: 3.9 cm
Weight: 3680 [oz_av]

## 2020-09-20 LAB — BASIC METABOLIC PANEL
Anion gap: 9 (ref 5–15)
BUN: 12 mg/dL (ref 8–23)
CO2: 21 mmol/L — ABNORMAL LOW (ref 22–32)
Calcium: 8.9 mg/dL (ref 8.9–10.3)
Chloride: 106 mmol/L (ref 98–111)
Creatinine, Ser: 0.81 mg/dL (ref 0.61–1.24)
GFR, Estimated: >60 mL/min (ref >60)
Glucose, Bld: 252 mg/dL — ABNORMAL HIGH (ref 70–99)
Potassium: 3.9 mmol/L (ref 3.5–5.1)
Sodium: 136 mmol/L (ref 135–145)

## 2020-09-20 LAB — HEPARIN LEVEL (UNFRACTIONATED): Heparin Unfractionated: 0.15 [IU]/mL — ABNORMAL LOW (ref 0.30–0.70)

## 2020-09-20 LAB — PROTIME-INR
INR: 1 (ref 0.8–1.2)
Prothrombin Time: 12.8 seconds (ref 11.4–15.2)

## 2020-09-20 LAB — HEPATIC FUNCTION PANEL
ALT: 19 U/L (ref 0–44)
AST: 17 U/L (ref 15–41)
Albumin: 3.8 g/dL (ref 3.5–5.0)
Alkaline Phosphatase: 47 U/L (ref 38–126)
Bilirubin, Direct: 0.2 mg/dL (ref 0.0–0.2)
Indirect Bilirubin: 0.4 mg/dL (ref 0.3–0.9)
Total Bilirubin: 0.6 mg/dL (ref 0.3–1.2)
Total Protein: 7.2 g/dL (ref 6.5–8.1)

## 2020-09-20 LAB — LIPID PANEL
Cholesterol: 101 mg/dL (ref 0–200)
HDL: 24 mg/dL — ABNORMAL LOW (ref >40)
LDL Cholesterol: 39 mg/dL (ref 0–99)
Total CHOL/HDL Ratio: 4.2 RATIO
Triglycerides: 191 mg/dL — ABNORMAL HIGH (ref <150)
VLDL: 38 mg/dL (ref 0–40)

## 2020-09-20 NOTE — Progress Notes (Signed)
[id number] Chart reviewed. Put OHS booklet, care guide ,IS,and staying in the tube handout in room. Pt gone for testing. Asked RN to show pt how to use IS and review materials. Luetta Nutting RN BSN 09/20/2020 2:53 PM

## 2020-09-20 NOTE — Progress Notes (Addendum)
ANTICOAGULATION CONSULT NOTE - Follow Up Consult  Pharmacy Consult for Heparin Indication: chest pain/ACS/ CAD  No Known Allergies  Patient Measurements: Height: 5\' 10"  (177.8 cm) Weight: 104.3 kg (230 lb) IBW/kg (Calculated) : 73 Heparin Dosing Weight: 95.2 kg  Vital Signs: Temp: 97.7 F (36.5 C) (10/30 1630) Temp Source: Oral (10/30 1630) BP: 127/87 (10/30 1630) Pulse Rate: 79 (10/30 1630)  Labs: Recent Labs    09/19/20 0946 09/19/20 1113 09/19/20 1348 09/20/20 0244 09/20/20 1736  HGB 16.8  --   --  14.8  --   HCT 49.4  --   --  41.9  --   PLT 172  --   --  148*  --   LABPROT  --   --   --   --  12.8  INR  --   --   --   --  1.0  HEPARINUNFRC  --   --   --   --  0.15*  CREATININE 0.90  --   --  0.81  --   TROPONINIHS 23* 27* 29*  --   --     Estimated Creatinine Clearance: 114.4 mL/min (by C-G formula based on SCr of 0.81 mg/dL).  Assessment:  64 year old male presented 09/19/20 with unstable angina. Hx PCI and LAD stent in May 2021 and s/p cath 10/29 with severe LAD and RCA restenosis. CABG tentatively 11/1. Pharmacy consulted to dose heparin gtt. No report of anticoagulants PTA.     First HL drawn late, was low at 0.15. H/H, plt with expected drop post-cath, still wnl.   Goal of Therapy:  Heparin level 0.3-0.7 units/ml Monitor platelets by anticoagulation protocol: Yes   Plan:  Increase to 1400 units/hr F/u 6hr HL Monitor daily HL, CBC/plt Monitor for signs/symptoms of bleeding    13/1, PharmD, BCPS, BCCP Clinical Pharmacist  Please check AMION for all [facility] phone numbers After 10:00 PM, call Main Pharmacy [phone]

## 2020-09-20 NOTE — Progress Notes (Signed)
Pre-CABG study completed.   Please see CV Proc for preliminary results.   Sharra Cayabyab, RDMS  

## 2020-09-20 NOTE — Progress Notes (Addendum)
See note from 10/31   ANTICOAGULATION CONSULT NOTE - Follow Up Consult  Pharmacy Consult for Heparin Indication: chest pain/ACS/ CAD  No Known Allergies  Patient Measurements: Height: 5\' 10"  (177.8 cm) Weight: 104.3 kg (230 lb) IBW/kg (Calculated) : 73 Heparin Dosing Weight: 95.2 kg  Vital Signs: Temp: 98.7 F (37.1 C) (10/30 0830) Temp Source: Oral (10/30 0830) BP: 136/96 (10/30 0830) Pulse Rate: 75 (10/30 0830)  Labs: Recent Labs    09/19/20 0946 09/19/20 1113 09/19/20 1348 09/20/20 0244  HGB 16.8  --   --  14.8  HCT 49.4  --   --  41.9  PLT 172  --   --  148*  CREATININE 0.90  --   --  0.81  TROPONINIHS 23* 27* 29*  --     Estimated Creatinine Clearance: 114.4 mL/min (by C-G formula based on SCr of 0.81 mg/dL).  Assessment:  64 year old male presented 09/19/20 with unstable angina. Hx PCI and LAD stent in May 2021.  Pharmacy consulted to dose heparin gtt. No report of anticoagulants PTA.    Goal of Therapy:  Heparin level 0.3-0.7 units/ml Monitor platelets by anticoagulation protocol: Yes    June 2021, PharmD PGY1 Acute Care Pharmacy Resident Please refer to [facility] for unit-specific pharmacist

## 2020-09-20 NOTE — Consult Note (Signed)
[address]       ,[place] [address]             [phone]        Bernon D [facility] Medical Record #[id number] Date of Birth: [birth year]  Referring: No ref. provider found Primary Care: Rory Percy, MD Primary Cardiologist:Suresh Bronson Ing, MD (Inactive)  Chief Complaint:    Chief Complaint  Patient presents with  . Chest Pain  Patient examined and images of coronary arteriogram personally reviewed and discussed with patient.  Echocardiogram is pending.  64 year old diabetic admitted following cardiac catheterization which shows progression of coronary disease and in-stent stenosis of the LAD and RCA systems.  He was having symptoms of unstable angina after PCI May 2021.  He stopped smoking at that time. Yesterday his coronary care gram showed significant re-in stent stenosis.  Ventriculogram not performed.  LVEDP 14 mmHg.  History of Present Illness:     Currently pain-free on IV heparin. Chest x-ray clear. On chronic Plavix for several years.  No bleeding from the radial artery puncture site but he does bruise easily.  Because of progressive disease and multiple layers of stents coronary bypass surgery has been recommended by his cardiologist.   Current Activity/ Functional Status: Patient very functional lives with his wife   Zubrod Score: At the time of surgery this patient's most appropriate activity status/level should be described as: $RemoveBefor'[]'JMqFEwKqQRTl$     0    Normal activity, no symptoms $RemoveBef'[x]'ynjFajdVrs$     1    Restricted in physical strenuous activity but ambulatory, able to do out light work $RemoveBe'[]'AiGIHfFGh$     2    Ambulatory and capable of self care, unable to do work activities, up and about                 more than 50%  Of the time                            '[]'$     3    Only limited self care, in bed greater than 50% of waking hours $RemoveBefo'[]'RweeZCVOKKe$     4    Completely disabled, no self care, confined to bed or chair $Remove'[]'SZeVKLg$     5    Moribund  Past Medical History:  Diagnosis Date  .  Diabetes mellitus   . Hyperlipidemia   . Hypertension   . Presence of stent in coronary artery   . S/P insertion of iliac artery stent     Past Surgical History:  Procedure Laterality Date  . CORONARY ATHERECTOMY N/A 04/04/2020   Procedure: CORONARY ATHERECTOMY;  Surgeon: Jettie Booze, MD;  Location: [place] [facility];  Service: Cardiovascular;  Laterality: N/A;  . CORONARY STENT INTERVENTION N/A 04/03/2020   Procedure: CORONARY STENT INTERVENTION;  Surgeon: Jettie Booze, MD;  Location: [place] [facility];  Service: Cardiovascular;  Laterality: N/A;  . INTRAVASCULAR ULTRASOUND/IVUS N/A 04/03/2020   Procedure: Intravascular Ultrasound/IVUS;  Surgeon: Jettie Booze, MD;  Location: [place] [facility];  Service: Cardiovascular;  Laterality: N/A;  . INTRAVASCULAR ULTRASOUND/IVUS N/A 04/04/2020   Procedure: Intravascular Ultrasound/IVUS;  Surgeon: Jettie Booze, MD;  Location: [place] [facility];  Service: Cardiovascular;  Laterality: N/A;  . LEFT HEART CATH AND CORONARY ANGIOGRAPHY N/A 04/03/2020   Procedure: LEFT HEART CATH AND CORONARY ANGIOGRAPHY;  Surgeon: Jettie Booze, MD;  Location: [place] [facility];  Service: Cardiovascular;  Laterality: N/A;  . LEFT HEART CATH AND CORONARY ANGIOGRAPHY N/A 04/04/2020   Procedure: LEFT HEART CATH AND CORONARY ANGIOGRAPHY;  Surgeon: Jettie Booze, MD;  Location: [place] [facility];  Service: Cardiovascular;  Laterality: N/A;    Social History   Tobacco Use  Smoking Status Former Smoker  . Types: Cigarettes  . Quit date: 04/02/2020  . Years since quitting: 0.4  Smokeless Tobacco Former Systems developer    Social History   Substance and Sexual Activity  Alcohol Use Yes     No Known Allergies  Current Facility-Administered Medications  Medication Dose Route Frequency Provider Last Rate Last Admin  . 0.9 %  sodium chloride infusion  250 mL Intravenous PRN Martinique, Florenda Watt M, MD      . 0.9 %  sodium chloride infusion   250 mL Intravenous PRN Burnell Blanks, MD      . acetaminophen (TYLENOL) tablet 650 mg  650 mg Oral Q4H PRN Burnell Blanks, MD      . aspirin EC tablet 81 mg  81 mg Oral Daily Martinique, Ketih Goodie M, MD   81 mg at 09/20/20 6384  . cholecalciferol (VITAMIN D3) tablet 1,000 Units  1,000 Units Oral Daily Martinique, Amberle Lyter M, MD   1,000 Units at 09/20/20 5364  . heparin ADULT infusion 100 units/mL (25000 units/263mL sodium chloride 0.45%)  1,200 Units/hr Intravenous Continuous Skeet Simmer, RPH 12 mL/hr at 09/20/20 0556 1,200 Units/hr at 09/20/20 0556  . lisinopril (ZESTRIL) tablet 10 mg  10 mg Oral Daily Donnamae Jude, RPH   10 mg at 09/20/20 6803   And  . hydrochlorothiazide (MICROZIDE) capsule 12.5 mg  12.5 mg Oral Daily Donnamae Jude, RPH   12.5 mg at 09/20/20 2122  . insulin aspart (novoLOG) injection 0-20 Units  0-20 Units Subcutaneous TID WC Martinique, Brayln Duque M, MD   7 Units at 09/20/20 1153  . insulin detemir (LEVEMIR) injection 25 Units  25 Units Subcutaneous QHS Donnamae Jude, [facility]   25 Units at 09/19/20 2231  . metoprolol succinate (TOPROL-XL) 24 hr tablet 25 mg  25 mg Oral Daily Martinique, Davidlee Jeanbaptiste M, MD   25 mg at 09/20/20 4825  . multivitamin with minerals tablet 1 tablet  1 tablet Oral Daily Martinique, Dion Sibal M, MD   1 tablet at 09/20/20 [phone]  . ondansetron (ZOFRAN) injection 4 mg  4 mg Intravenous Q6H PRN Burnell Blanks, MD      . rosuvastatin (CRESTOR) tablet 20 mg  20 mg Oral Daily Martinique, Neosha Switalski M, MD   20 mg at 09/20/20 0488  . sodium chloride flush (NS) 0.9 % injection 3 mL  3 mL Intravenous Q12H Martinique, Johnye Kist M, MD      . sodium chloride flush (NS) 0.9 % injection 3 mL  3 mL Intravenous PRN Martinique, Coltan Spinello M, MD      . sodium chloride flush (NS) 0.9 % injection 3 mL  3 mL Intravenous Q12H Lauree Chandler D, MD      . sodium chloride flush (NS) 0.9 % injection 3 mL  3 mL Intravenous PRN Burnell Blanks, MD        Medications Prior to Admission  Medication Sig  Dispense Refill Last Dose  . aspirin EC 81 MG tablet Take 81 mg by mouth daily.   09/18/2020 at Unknown time  . Blood Glucose Monitoring Suppl (TRUE METRIX AIR GLUCOSE METER) w/Device KIT USE AS DIRECTED 1 kit 0   . cholecalciferol (VITAMIN D3) 25 MCG (  1000 UNIT) tablet Take 1,000 Units by mouth daily.   09/18/2020 at Unknown time  . clopidogrel (PLAVIX) 75 MG tablet Take 1 tablet (75 mg total) by mouth daily with breakfast. 90 tablet 3 09/18/2020 at Unknown time  . insulin detemir (LEVEMIR FLEXTOUCH) 100 UNIT/ML FlexPen Inject 25 Units into the skin at bedtime. 15 mL 3 09/18/2020 at Unknown time  . isosorbide mononitrate (IMDUR) 30 MG 24 hr tablet Take 1 tablet (30 mg total) by mouth daily. 90 tablet 1 09/18/2020 at Unknown time  . lisinopril-hydrochlorothiazide (PRINZIDE,ZESTORETIC) 10-12.5 MG per tablet Take 1 tablet by mouth daily.     09/18/2020 at Unknown time  . metFORMIN (GLUCOPHAGE-XR) 500 MG 24 hr tablet Take 500 mg by mouth 2 (two) times daily.   09/18/2020 at Unknown time  . metoprolol succinate (TOPROL-XL) 25 MG 24 hr tablet Take 25 mg by mouth daily.     09/18/2020 at Unknown time  . Multiple Vitamin (MULTIVITAMIN WITH MINERALS) TABS tablet Take 1 tablet by mouth daily.   09/18/2020 at Unknown time  . nitroGLYCERIN (NITROSTAT) 0.4 MG SL tablet Place 0.4 mg under the tongue every 5 (five) minutes as needed for chest pain.   09/19/2020 at 0630  . rosuvastatin (CRESTOR) 20 MG tablet Take 1 tablet (20 mg total) by mouth daily. 90 tablet 3 09/18/2020 at Unknown time  . Semaglutide,0.25 or 0.5MG /DOS, (OZEMPIC, 0.25 OR 0.5 MG/DOSE,) 2 MG/1.5ML SOPN Inject 0.5 mg into the skin once a week.    Past Week at Unknown time  . Global Inject Ease Lancets 30G MISC USE FOUR TIMES DAILY 100 each 0   . Insulin Pen Needle 32G X 4 MM MISC 1 box of 100 each 1 each 2     No family history on file.   Review of Systems:   ROS Right-hand-dominant No spontaneous bleeding problems on Plavix No history of  thoracic surgery or thoracic trauma    Cardiac Review of Systems: Y or  [    ]= no  Chest Pain [   yes]  Resting SOB [   ] Exertional SOB  [  ]  Orthopnea [  ]   Pedal Edema [   ]    Palpitations [  ] Syncope  [  ]   Presyncope [   ]  General Review of Systems: [Y] = yes [  ]=no Constitional: recent weight change [  ]; anorexia [  ]; fatigue [  ]; nausea [  ]; night sweats [  ]; fever [  ]; or chills [  ]                                                               Dental: Last Dentist visit: 1 year  Eye : blurred vision [  ]; diplopia [   ]; vision changes [  ];  Amaurosis fugax[  ]; Resp: cough [  ];  wheezing[  ];  hemoptysis[  ]; shortness of breath[  ]; paroxysmal nocturnal dyspnea[  ]; dyspnea on exertion[  ]; or orthopnea[  ];  GI:  gallstones[  ], vomiting[  ];  dysphagia[  ]; melena[  ];  hematochezia [  ]; heartburn[  ];   Hx of  Colonoscopy[  ]; GU: kidney stones [  ];  hematuria[  ];   dysuria [  ];  nocturia[  ];  history of     obstruction [  ]; urinary frequency [  ]             Skin: rash, swelling[  ];, hair loss[  ];  peripheral edema[  ];  or itching[  ]; Musculosketetal: myalgias[  ];  joint swelling[  ];  joint erythema[  ];  joint pain[  ];  back pain[yes history of laminectomy];  Heme/Lymph: bruising[  ];  bleeding[  ];  anemia[  ];  Neuro: TIA[  ];  headaches[  ];  stroke[  ];  vertigo[  ];  seizures[  ];   paresthesias[  ];  difficulty walking[  ];  Psych:depression[  ]; anxiety[  ];  Endocrine: diabetes[yes hemoglobin A1c pending];  thyroid dysfunction[  ];                 Physical Exam: BP (!) 136/96 (BP Location: Right Arm)   Pulse 75   Temp 98.7 F (37.1 C) (Oral)   Resp 18   Ht $R'5\' 10"'je$  (1.778 m)   Wt 104.3 kg   SpO2 96%   BMI 33.00 kg/m         Exam    General- alert and comfortable    Neck- no JVD, no cervical adenopathy palpable, no carotid bruit   Lungs- clear without rales, wheezes   Cor- regular rate and rhythm, no murmur , gallop    Abdomen- soft, non-tender   Extremities - warm, non-tender, minimal edema   Neuro- oriented, appropriate, no focal weakness   Diagnostic Studies & Laboratory data:     Recent Radiology Findings:   DG Chest 2 View  Result Date: 09/19/2020 CLINICAL DATA:  Chest pain and shortness of breath EXAM: CHEST - 2 VIEW COMPARISON:  July 26, 2011 FINDINGS: There is no edema or airspace opacity. Heart size and pulmonary vascularity are normal. There is aortic atherosclerosis. No adenopathy. No bone lesions. No pneumothorax. IMPRESSION: Lungs clear.  Cardiac silhouette normal. Aortic Atherosclerosis (ICD10-I70.0). Electronically Signed   By: Lowella Grip III M.D.   On: 09/19/2020 10:24   CARDIAC CATHETERIZATION  Result Date: 09/19/2020  Previously placed Prox LAD drug eluting stent is widely patent.  Balloon angioplasty was performed.  Prox RCA to Mid RCA lesion is 25% stenosed.  Dist RCA lesion is 10% stenosed.  Non-stenotic Prox RCA lesion was previously treated.  Mid RCA lesion is 99% stenosed.  Mid LAD lesion is 70% stenosed with 0% stenosed side branch in 2nd Diag.  1. The LAD is a large caliber vessel that courses to the apex. The proximal and mid vessel has a stented segment. There is a severe restenosis in the mid LAD stented segment in the stent that was placed in may 2021. (DFR=0.85). 2. The Circumflex is small with diffuse plaque 3. The RCA is a large dominant artery with stents from the ostium down through the distal vessel. There is severe restenosis in the mid stented segment. This area has 2 layers of stent and was treated in May 2021 with cutting balloon angioplasty. Recommendations: He has progression of disease in the stented segments of the LAD and RCA. The LAD stent was placed in may 2021. The mid RCA stented segment has 2 layers of stent and was treated with cutting balloon angioplasty in May 2021. The LAD restenosis is significant by pressure wire analysis. I have reviewed his  case with Dr. Martinique  and we both agree that his best option will be CABG. He will be admitted to telemetry. He is pain free. Will arrange an echo. Will call CT surgery consult for CABG. Will stop Plavix. Continue ASA. Begin IV heparin 8 hours post sheath pull.     I have independently reviewed the above radiologic studies and discussed with the patient   Recent Lab Findings: Lab Results  Component Value Date   WBC 8.4 09/20/2020   HGB 14.8 09/20/2020   HCT 41.9 09/20/2020   PLT 148 (L) 09/20/2020   GLUCOSE 252 (H) 09/20/2020   CHOL 101 09/20/2020   TRIG 191 (H) 09/20/2020   HDL 24 (L) 09/20/2020   LDLCALC 39 09/20/2020   ALT 21 09/19/2020   AST 19 09/19/2020   NA 136 09/20/2020   K 3.9 09/20/2020   CL 106 09/20/2020   CREATININE 0.81 09/20/2020   BUN 12 09/20/2020   CO2 21 (L) 09/20/2020   INR 0.93 07/26/2011   HGBA1C 9.8 (H) 09/19/2020      Assessment / Plan:   We will plan multivessel bypass grafting after Plavix washout. We will check P2 Y 12 assays today and tomorrow with goal to schedule surgery Monday Procedure indications benefits and risk discussed in detail with patient and family and they understand and agree.       '@ME1'$ @ 09/20/2020 11:56 AM

## 2020-09-20 NOTE — Progress Notes (Signed)
Progress Note  Patient Name: [name] Gray Date of Encounter: 09/20/2020  Primary Cardiologist: Aaron Edelman [facility]  Subjective   No recurrent chest pain  Inpatient Medications    Scheduled Meds: . aspirin EC  81 mg Oral Daily  . cholecalciferol  1,000 Units Oral Daily  . lisinopril  10 mg Oral Daily   And  . hydrochlorothiazide  12.5 mg Oral Daily  . insulin aspart  0-20 Units Subcutaneous TID WC  . insulin detemir  25 Units Subcutaneous QHS  . metoprolol succinate  25 mg Oral Daily  . multivitamin with minerals  1 tablet Oral Daily  . rosuvastatin  20 mg Oral Daily  . sodium chloride flush  3 mL Intravenous Q12H  . sodium chloride flush  3 mL Intravenous Q12H   Continuous Infusions: . sodium chloride    . sodium chloride    . heparin 1,200 Units/hr (09/20/20 0556)   PRN Meds: sodium chloride, sodium chloride, acetaminophen, ondansetron (ZOFRAN) IV, sodium chloride flush, sodium chloride flush   Vital Signs    Vitals:   09/19/20 1847 09/20/20 0100 09/20/20 0830 09/20/20 1316  BP: (!) 158/88 138/72 (!) 136/96 127/74  Pulse: 75  75 70  Resp: 14 18 18 16   Temp: 98.4 F (36.9 C) 98.2 F (36.8 C) 98.7 F (37.1 C) 97.9 F (36.6 C)  TempSrc: Oral Oral Oral Oral  SpO2: 95% 90% 96% 95%  Weight:      Height:        Intake/Output Summary (Last 24 hours) at 09/20/2020 1401 Last data filed at 09/20/2020 1013 Gross per 24 hour  Intake 240 ml  Output 2 ml  Net 238 ml    I/O since admission: +238  Filed Weights   09/19/20 0939  Weight: 104.3 kg    Telemetry    Sinus - Personally Reviewed  ECG    ECG (independently read by me): NSR at 75; 1st degraa AVB, NSST changes  Physical Exam   BP 127/74 (BP Location: Right Arm)   Pulse 70   Temp 97.9 F (36.6 C) (Oral)   Resp 16   Ht 5\' 10"  (1.778 m)   Wt 104.3 kg   SpO2 95%   BMI 33.00 kg/m  General: Alert, oriented, no distress.  Skin: normal turgor, no rashes, warm and dry HEENT: Normocephalic,  atraumatic. Pupils equal round and reactive to light; sclera anicteric; extraocular muscles intact; Nose without nasal septal hypertrophy Mouth/Parynx benign; Mallinpatti scale 3 Neck: Thick neck; no JVD, no carotid bruits; normal carotid upstroke Lungs: clear to ausculatation and percussion; no wheezing or rales Chest wall: without tenderness to palpitation Heart: PMI not displaced, RRR, s1 s2 normal, 1/6 systolic murmur, no diastolic murmur, no rubs, gallops, thrills, or heaves Abdomen: soft, nontender; no hepatosplenomehaly, BS+; abdominal aorta nontender and not dilated by palpation. Back: no CVA tenderness Pulses 2+ right radial cath site stable Musculoskeletal: full range of motion, normal strength, no joint deformities Extremities: no clubbing cyanosis or edema, Homan's sign negative  Neurologic: grossly nonfocal; Cranial nerves grossly wnl Psychologic: Normal mood and affect   Labs    Chemistry Recent Labs  Lab 09/19/20 0946 09/19/20 1113 09/20/20 0244  NA 137  --  136  K 3.6  --  3.9  CL 101  --  106  CO2 25  --  21*  GLUCOSE 224*  --  252*  BUN 12  --  12  CREATININE 0.90  --  0.81  CALCIUM 9.6  --  8.9  PROT  --  7.5  --   ALBUMIN  --  4.0  --   AST  --  19  --   ALT  --  21  --   ALKPHOS  --  40  --   BILITOT  --  0.8  --   GFRNONAA >60  --  >60  ANIONGAP 11  --  9     Hematology Recent Labs  Lab 09/19/20 0946 09/20/20 0244  WBC 9.1 8.4  RBC 5.16 4.46  HGB 16.8 14.8  HCT 49.4 41.9  MCV 95.7 93.9  MCH 32.6 33.2  MCHC 34.0 35.3  RDW 12.2 12.1  PLT 172 148*    Cardiac EnzymesNo results for input(s): TROPONINI in the last 168 hours. No results for input(s): TROPIPOC in the last 168 hours.   BNPNo results for input(s): BNP, PROBNP in the last 168 hours.   DDimer No results for input(s): DDIMER in the last 168 hours.   Lipid Panel     Component Value Date/Time   CHOL 101 09/20/2020 0244   TRIG 191 (H) 09/20/2020 0244   HDL 24 (L) 09/20/2020  0244   CHOLHDL 4.2 09/20/2020 0244   VLDL 38 09/20/2020 0244   LDLCALC 39 09/20/2020 0244     Radiology    DG Chest 2 View  Result Date: 09/19/2020 CLINICAL DATA:  Chest pain and shortness of breath EXAM: CHEST - 2 VIEW COMPARISON:  July 26, 2011 FINDINGS: There is no edema or airspace opacity. Heart size and pulmonary vascularity are normal. There is aortic atherosclerosis. No adenopathy. No bone lesions. No pneumothorax. IMPRESSION: Lungs clear.  Cardiac silhouette normal. Aortic Atherosclerosis (ICD10-I70.0). Electronically Signed   By: Bretta Bang III M.D.   On: 09/19/2020 10:24   CARDIAC CATHETERIZATION  Result Date: 09/19/2020  Previously placed Prox LAD drug eluting stent is widely patent.  Balloon angioplasty was performed.  Prox RCA to Mid RCA lesion is 25% stenosed.  Dist RCA lesion is 10% stenosed.  Non-stenotic Prox RCA lesion was previously treated.  Mid RCA lesion is 99% stenosed.  Mid LAD lesion is 70% stenosed with 0% stenosed side branch in 2nd Diag.  1. The LAD is a large caliber vessel that courses to the apex. The proximal and mid vessel has a stented segment. There is a severe restenosis in the mid LAD stented segment in the stent that was placed in may 2021. (DFR=0.85). 2. The Circumflex is small with diffuse plaque 3. The RCA is a large dominant artery with stents from the ostium down through the distal vessel. There is severe restenosis in the mid stented segment. This area has 2 layers of stent and was treated in May 2021 with cutting balloon angioplasty. Recommendations: He has progression of disease in the stented segments of the LAD and RCA. The LAD stent was placed in may 2021. The mid RCA stented segment has 2 layers of stent and was treated with cutting balloon angioplasty in May 2021. The LAD restenosis is significant by pressure wire analysis. I have reviewed his case with Dr. [place]az[place] and we both agree that his best option will be CABG. He will be  admitted to telemetry. He is pain free. Will arrange an echo. Will call CT surgery consult for CABG. Will stop Plavix. Continue ASA. Begin IV heparin 8 hours post sheath pull.    Cardiac Studies    Previously placed Prox LAD drug eluting stent is widely patent.  Balloon angioplasty was performed.  Prox RCA to Mid RCA lesion is 25% stenosed.  Dist RCA lesion is 10% stenosed.  Non-stenotic Prox RCA lesion was previously treated.  Mid RCA lesion is 99% stenosed.  Mid LAD lesion is 70% stenosed with 0% stenosed side branch in 2nd Diag.   1. The LAD is a large caliber vessel that courses to the apex. The proximal and mid vessel has a stented segment. There is a severe restenosis in the mid LAD stented segment in the stent that was placed in may 2021. (DFR=0.85).  2. The Circumflex is small with diffuse plaque 3. The RCA is a large dominant artery with stents from the ostium down through the distal vessel. There is severe restenosis in the mid stented segment. This area has 2 layers of stent and was treated in May 2021 with cutting balloon angioplasty.   Recommendations: He has progression of disease in the stented segments of the LAD and RCA. The LAD stent was placed in may 2021. The mid RCA stented segment has 2 layers of stent and was treated with cutting balloon angioplasty in May 2021. The LAD restenosis is significant by pressure wire analysis. I have reviewed his case with Dr. [place] and we both agree that his best option will be CABG.  He will be admitted to telemetry. He is pain free. Will arrange an echo. Will call CT surgery consult for CABG. Will stop Plavix. Continue ASA. Begin IV heparin 8 hours post sheath pull.        Patient Profile   [name] Gray is a 64 y.o. male with history of HTN, HLD, DM on insulin, tobacco abuse and extensive coronary history presents with progressive chest pain  Assessment & Plan    1. CAD: Progressive CAD with in-stent restenosis.  Plan  is for CABG revascularization.  Patient's last dose of Plavix was on Thursday.  Patient was seen by Dr. Donata Clay who plans multivessel bypass grafting after Plavix washout.  P2Y12 test to assess adequate washout with hopeful plans for CABG revascularization on Monday.  2. HTN: BP currently stable on current regimen on lisinopril, HCTZ, and metoprolol  3: Hyperlipidemia: LDL cholesterol 39 on rosuvastatin  4.  DM: On insulin.  5.  Obesity: We will need weight loss and good candidate for cardiac rehabilitation post revascularization.  Signed, Lennette Bihari, MD, [facility] 09/20/2020, 2:01 PM

## 2020-09-20 NOTE — Progress Notes (Signed)
  Echocardiogram 2D Echocardiogram has been performed.  Tye Savoy 09/20/2020, 3:16 PM

## 2020-09-21 DIAGNOSIS — I200 Unstable angina: Secondary | ICD-10-CM | POA: Diagnosis not present

## 2020-09-21 LAB — CBC
HCT: 42.2 % (ref 39.0–52.0)
Hemoglobin: 14.8 g/dL (ref 13.0–17.0)
MCH: 32.9 pg (ref 26.0–34.0)
MCHC: 35.1 g/dL (ref 30.0–36.0)
MCV: 93.8 fL (ref 80.0–100.0)
Platelets: 158 10*3/uL (ref 150–400)
RBC: 4.5 MIL/uL (ref 4.22–5.81)
RDW: 12.2 % (ref 11.5–15.5)
WBC: 8.7 10*3/uL (ref 4.0–10.5)
nRBC: 0 % (ref 0.0–0.2)

## 2020-09-21 LAB — BLOOD GAS, ARTERIAL
Acid-Base Excess: 0.4 mmol/L (ref 0.0–2.0)
Bicarbonate: 23.8 mmol/L (ref 20.0–28.0)
Drawn by: 59133
FIO2: 21
O2 Saturation: 90.5 %
Patient temperature: 36.9
pCO2 arterial: 34.3 mmHg (ref 32.0–48.0)
pH, Arterial: 7.456 — ABNORMAL HIGH (ref 7.350–7.450)
pO2, Arterial: 58.1 mmHg — ABNORMAL LOW (ref 83.0–108.0)

## 2020-09-21 LAB — GLUCOSE, CAPILLARY
Glucose-Capillary: 216 mg/dL — ABNORMAL HIGH (ref 70–99)
Glucose-Capillary: 254 mg/dL — ABNORMAL HIGH (ref 70–99)
Glucose-Capillary: 196 mg/dL — ABNORMAL HIGH (ref 70–99)
Glucose-Capillary: 257 mg/dL — ABNORMAL HIGH (ref 70–99)

## 2020-09-21 LAB — PLATELET INHIBITION P2Y12: Platelet Function  P2Y12: 171 [PRU] — ABNORMAL LOW (ref 182–335)

## 2020-09-21 LAB — HEPARIN LEVEL (UNFRACTIONATED)
Heparin Unfractionated: 0.22 IU/mL — ABNORMAL LOW (ref 0.30–0.70)
Heparin Unfractionated: 0.54 IU/mL (ref 0.30–0.70)

## 2020-09-21 LAB — TSH: TSH: 4.363 u[IU]/mL (ref 0.350–4.500)

## 2020-09-21 LAB — HEMOGLOBIN A1C
Hgb A1c MFr Bld: 9.7 % — ABNORMAL HIGH (ref 4.8–5.6)
Mean Plasma Glucose: 231.69 mg/dL

## 2020-09-21 MED ORDER — METOPROLOL SUCCINATE ER 25 MG PO TB24
12.5000 mg | ORAL_TABLET | Freq: Once | ORAL | Status: AC
  Administered 2020-09-21: 12.5 mg via ORAL
  Filled 2020-09-21: qty 1

## 2020-09-21 MED ORDER — MUPIROCIN CALCIUM 2 % EX CREA
TOPICAL_CREAM | Freq: Two times a day (BID) | CUTANEOUS | Status: DC
  Administered 2020-09-22: 1 via TOPICAL
  Filled 2020-09-21: qty 15

## 2020-09-21 MED ORDER — METOPROLOL SUCCINATE ER 25 MG PO TB24
37.5000 mg | ORAL_TABLET | Freq: Every day | ORAL | Status: DC
  Administered 2020-09-22: 37.5 mg via ORAL
  Filled 2020-09-21: qty 2

## 2020-09-21 MED ORDER — MUPIROCIN 2 % EX OINT
TOPICAL_OINTMENT | CUTANEOUS | Status: AC
  Filled 2020-09-21: qty 22

## 2020-09-21 MED ORDER — INSULIN DETEMIR 100 UNIT/ML ~~LOC~~ SOLN
30.0000 [IU] | Freq: Every day | SUBCUTANEOUS | Status: DC
  Administered 2020-09-21 – 2020-09-22 (×2): 30 [IU] via SUBCUTANEOUS
  Filled 2020-09-21 (×3): qty 0.3

## 2020-09-21 NOTE — Progress Notes (Signed)
ANTICOAGULATION CONSULT NOTE - Follow Up Consult  Pharmacy Consult for Heparin Indication: chest pain/ACS/ CAD  No Known Allergies  Patient Measurements: Height: 5\' 10"  (177.8 cm) Weight: 104.3 kg (230 lb) IBW/kg (Calculated) : 73 Heparin Dosing Weight: 95.2 kg  Vital Signs: Temp: 98 F (36.7 C) (10/31 1143) Temp Source: Oral (10/31 1143) BP: 154/89 (10/31 1143) Pulse Rate: 96 (10/31 1143)  Labs: Recent Labs    09/19/20 0946 09/19/20 0946 09/19/20 1113 09/19/20 1348 09/20/20 0244 09/20/20 1736 09/21/20 0227 09/21/20 1222  HGB 16.8   < >  --   --  14.8  --  14.8  --   HCT 49.4  --   --   --  41.9  --  42.2  --   PLT 172  --   --   --  148*  --  158  --   LABPROT  --   --   --   --   --  12.8  --   --   INR  --   --   --   --   --  1.0  --   --   HEPARINUNFRC  --   --   --   --   --  0.15* 0.22* 0.54  CREATININE 0.90  --   --   --  0.81  --   --   --   TROPONINIHS 23*  --  27* 29*  --   --   --   --    < > = values in this interval not displayed.    Estimated Creatinine Clearance: 114.4 mL/min (by C-G formula based on SCr of 0.81 mg/dL).  Assessment:  64 year old male presented 09/19/20 with unstable angina. Hx PCI and LAD stent in May 2021.  Pharmacy consulted to dose heparin gtt. No report of anticoagulants PTA.   Heparin level = 0.54, therapeutic. Hemoglobin stable, PLT WNL. No signs of bleeding charted.  Goal of Therapy:  Heparin level 0.3-0.7 units/ml Monitor platelets by anticoagulation protocol: Yes   Plan:  - Continue heparin at 1,600 units/hr - Monitor daily heparin level and CBC, signs of bleeding - Follow plans for surgery on 11/2  13/2, PharmD PGY1 Acute Care Pharmacy Resident Please refer to [facility] for unit-specific pharmacist

## 2020-09-21 NOTE — Progress Notes (Signed)
Progress Note  Patient Name: [name] Gray Date of Encounter: 09/21/2020  Primary Cardiologist: Aaron [facility]  Subjective   No recurrent chest pain; feels well on meds and heparin  Inpatient Medications    Scheduled Meds: . aspirin EC  81 mg Oral Daily  . cholecalciferol  1,000 Units Oral Daily  . lisinopril  10 mg Oral Daily   And  . hydrochlorothiazide  12.5 mg Oral Daily  . insulin aspart  0-20 Units Subcutaneous TID WC  . insulin detemir  25 Units Subcutaneous QHS  . metoprolol succinate  25 mg Oral Daily  . multivitamin with minerals  1 tablet Oral Daily  . rosuvastatin  20 mg Oral Daily  . sodium chloride flush  3 mL Intravenous Q12H  . sodium chloride flush  3 mL Intravenous Q12H   Continuous Infusions: . sodium chloride    . sodium chloride    . heparin 1,600 Units/hr (09/21/20 0454)   PRN Meds: sodium chloride, sodium chloride, acetaminophen, ondansetron (ZOFRAN) IV, sodium chloride flush, sodium chloride flush   Vital Signs    Vitals:   09/21/20 0500 09/21/20 0600 09/21/20 0825 09/21/20 1143  BP:   (!) 148/79 (!) 154/89  Pulse: 64 60 79 96  Resp: 11 15 12 16   Temp:   97.7 F (36.5 C) 98 F (36.7 C)  TempSrc:   Oral Oral  SpO2: 94% 93% 93% 91%  Weight:      Height:        Intake/Output Summary (Last 24 hours) at 09/21/2020 1159 Last data filed at 09/20/2020 1500 Gross per 24 hour  Intake 108.45 ml  Output --  Net 108.45 ml    I/O since admission: +346  Filed Weights   09/19/20 0939  Weight: 104.3 kg    Telemetry    Sinus - Personally Reviewed  ECG    ECG (independently read by me): NSR at 75; 1st degraa AVB, NSST changes  Physical Exam    BP (!) 154/89 (BP Location: Left Arm)   Pulse 96   Temp 98 F (36.7 C) (Oral)   Resp 16   Ht 5\' 10"  (1.778 m)   Wt 104.3 kg   SpO2 91%   BMI 33.00 kg/m  General: Alert, oriented, no distress.  Skin: normal turgor, no rashes, warm and dry HEENT: Normocephalic, atraumatic. Pupils  equal round and reactive to light; sclera anicteric; extraocular muscles intact;  Nose without nasal septal hypertrophy Mouth/Parynx benign; Mallinpatti scale 3 Neck: No JVD, no carotid bruits; normal carotid upstroke Lungs: clear to ausculatation and percussion; no wheezing or rales Chest wall: without tenderness to palpitation Heart: PMI not displaced, RRR, s1 s2 normal, 1/6 systolic murmur, no diastolic murmur, no rubs, gallops, thrills, or heaves Abdomen: soft, nontender; no hepatosplenomehaly, BS+; abdominal aorta nontender and not dilated by palpation. Back: no CVA tenderness Pulses 2+ Musculoskeletal: full range of motion, normal strength, no joint deformities Extremities: no clubbing cyanosis or edema, Homan's sign negative  Neurologic: grossly nonfocal; Cranial nerves grossly wnl Psychologic: Normal mood and affect    Labs    Chemistry Recent Labs  Lab 09/19/20 0946 09/19/20 1113 09/20/20 0244 09/20/20 1736  NA 137  --  136  --   K 3.6  --  3.9  --   CL 101  --  106  --   CO2 25  --  21*  --   GLUCOSE 224*  --  252*  --   BUN 12  --  12  --  CREATININE 0.90  --  0.81  --   CALCIUM 9.6  --  8.9  --   PROT  --  7.5  --  7.2  ALBUMIN  --  4.0  --  3.8  AST  --  19  --  17  ALT  --  21  --  19  ALKPHOS  --  40  --  47  BILITOT  --  0.8  --  0.6  GFRNONAA >60  --  >60  --   ANIONGAP 11  --  9  --      Hematology Recent Labs  Lab 09/19/20 0946 09/20/20 0244 09/21/20 0227  WBC 9.1 8.4 8.7  RBC 5.16 4.46 4.50  HGB 16.8 14.8 14.8  HCT 49.4 41.9 42.2  MCV 95.7 93.9 93.8  MCH 32.6 33.2 32.9  MCHC 34.0 35.3 35.1  RDW 12.2 12.1 12.2  PLT 172 148* 158    Cardiac EnzymesNo results for input(s): TROPONINI in the last 168 hours. No results for input(s): TROPIPOC in the last 168 hours.   BNPNo results for input(s): BNP, PROBNP in the last 168 hours.   DDimer No results for input(s): DDIMER in the last 168 hours.   Lipid Panel     Component Value Date/Time     CHOL 101 09/20/2020 0244   TRIG 191 (H) 09/20/2020 0244   HDL 24 (L) 09/20/2020 0244   CHOLHDL 4.2 09/20/2020 0244   VLDL 38 09/20/2020 0244   LDLCALC 39 09/20/2020 0244     Radiology    CARDIAC CATHETERIZATION  Result Date: 09/19/2020  Previously placed Prox LAD drug eluting stent is widely patent.  Balloon angioplasty was performed.  Prox RCA to Mid RCA lesion is 25% stenosed.  Dist RCA lesion is 10% stenosed.  Non-stenotic Prox RCA lesion was previously treated.  Mid RCA lesion is 99% stenosed.  Mid LAD lesion is 70% stenosed with 0% stenosed side branch in 2nd Diag.  1. The LAD is a large caliber vessel that courses to the apex. The proximal and mid vessel has a stented segment. There is a severe restenosis in the mid LAD stented segment in the stent that was placed in may 2021. (DFR=0.85). 2. The Circumflex is small with diffuse plaque 3. The RCA is a large dominant artery with stents from the ostium down through the distal vessel. There is severe restenosis in the mid stented segment. This area has 2 layers of stent and was treated in May 2021 with cutting balloon angioplasty. Recommendations: He has progression of disease in the stented segments of the LAD and RCA. The LAD stent was placed in may 2021. The mid RCA stented segment has 2 layers of stent and was treated with cutting balloon angioplasty in May 2021. The LAD restenosis is significant by pressure wire analysis. I have reviewed his case with Dr. Swazil[place] and we both agree that his best option will be CABG. He will be admitted to telemetry. He is pain free. Will arrange an echo. Will call CT surgery consult for CABG. Will stop Plavix. Continue ASA. Begin IV heparin 8 hours post sheath pull.   ECHOCARDIOGRAM COMPLETE  Result Date: 09/20/2020    ECHOCARDIOGRAM REPORT   Patient Name:   [name] Gray Supak Date of Exam: 09/20/2020 Medical Rec #:  [id number]       Height:       70.0 in Accession #:    [id number]      Weight:  230.0 lb Date of Birth:  03-19-1956       BSA:          2.215 m Patient Age:    64 years        BP:           127/74 mmHg Patient Gender: M               HR:           70 bpm. Exam Location:  Inpatient Procedure: 2D Echo Indications:    CAD Native Vessel I25.10  History:        Patient has no prior history of Echocardiogram examinations.                 Risk Factors:Hypertension, Dyslipidemia and Diabetes.  Sonographer:    Thurman Coyer RDCS (AE) Referring Phys: [id number] CHRISTOPHER D MCALHANY IMPRESSIONS  1. Left ventricular ejection fraction, by estimation, is 50 to 55%. The left ventricle has low normal function. Difficult to assess wall motion due to incomplete visualization of LV myocardium. Based on limited views, all LV segments appear to have low normal contractility. There is mild concentric left ventricular hypertrophy. Left ventricular diastolic parameters are consistent with Grade I diastolic dysfunction (impaired relaxation).  2. Right ventricular systolic function is mildly reduced. The right ventricular size is normal.  3. The mitral valve is normal in structure. Trivial mitral valve regurgitation.  4. The inferior vena cava is normal in size with greater than 50% respiratory variability, suggesting right atrial pressure of 3 mmHg.  5. The aortic valve is grossly normal. There is mild thickening of the aortic valve. Aortic valve regurgitation is not visualized. Comparison(s): No prior Echocardiogram. FINDINGS  Left Ventricle: Left ventricular ejection fraction, by estimation, is 50 to 55%. The left ventricle has low normal function. Difficult to assess wall motion due to incomplete visualization of LV myocardium. Based on limited views, all LV segments appear  to have low normal contractility. The left ventricular internal cavity size was normal in size. There is mild concentric left ventricular hypertrophy. Left ventricular diastolic parameters are consistent with Grade I diastolic dysfunction  (impaired relaxation). Right Ventricle: The right ventricular size is normal. Right vetricular wall thickness was not assessed. Right ventricular systolic function is mildly reduced. Left Atrium: Left atrial size was normal in size. Right Atrium: Right atrial size was normal in size. Pericardium: There is no evidence of pericardial effusion. Mitral Valve: The mitral valve is normal in structure. There is mild thickening of the mitral valve leaflet(s). There is mild calcification of the mitral valve leaflet(s). Mild mitral annular calcification. Trivial mitral valve regurgitation. Tricuspid Valve: The tricuspid valve is normal in structure. Tricuspid valve regurgitation is trivial. Aortic Valve: The aortic valve is grossly normal. There is mild thickening of the aortic valve. Aortic valve regurgitation is not visualized. Pulmonic Valve: The pulmonic valve was normal in structure. Pulmonic valve regurgitation is not visualized. Aorta: The aortic root is normal in size and structure. Venous: The inferior vena cava is normal in size with greater than 50% respiratory variability, suggesting right atrial pressure of 3 mmHg. IAS/Shunts: No atrial level shunt detected by color flow Doppler.  LEFT VENTRICLE PLAX 2D LVIDd:         5.40 cm  Diastology LVIDs:         3.90 cm  LV e' medial:    6.53 cm/s LV PW:         1.20 cm  LV E/e' medial:  10.4 LV IVS:        1.20 cm  LV e' lateral:   9.68 cm/s LVOT diam:     2.60 cm  LV E/e' lateral: 7.0 LV SV:         77 LV SV Index:   35 LVOT Area:     5.31 cm  RIGHT VENTRICLE RV S prime:     8.59 cm/s TAPSE (M-mode): 1.4 cm LEFT ATRIUM             Index       RIGHT ATRIUM           Index LA diam:        4.50 cm 2.03 cm/m  RA Area:     15.30 cm LA Vol (A2C):   58.9 ml 26.59 ml/m RA Volume:   38.40 ml  17.34 ml/m LA Vol (A4C):   55.0 ml 24.83 ml/m LA Biplane Vol: 57.1 ml 25.78 ml/m  AORTIC VALVE LVOT Vmax:   65.30 cm/s LVOT Vmean:  44.500 cm/s LVOT VTI:    0.145 m  AORTA Ao Root  diam: 3.60 cm MITRAL VALVE MV Area (PHT): 3.08 cm    SHUNTS MV Decel Time: 246 msec    Systemic VTI:  0.14 m MV E velocity: 68.10 cm/s  Systemic Diam: 2.60 cm MV A velocity: 89.50 cm/s MV E/A ratio:  0.76 Laurance Flatten MD Electronically signed by Laurance Flatten MD Signature Date/Time: 09/20/2020/3:44:25 PM    Final    VAS US DOPPLER PRE CABG  Result Date: 09/21/2020 PREOPERATIVE VASCULAR EVALUATION  Indications:      Pre-CABG. Comparison Study: No prior studies. Performing Technologist: Jean Rosenthal  Examination Guidelines: A complete evaluation includes B-mode imaging, spectral Doppler, color Doppler, and power Doppler as needed of all accessible portions of each vessel. Bilateral testing is considered an integral part of a complete examination. Limited examinations for reoccurring indications may be performed as noted.  Right Carotid Findings: +----------+--------+--------+--------+-------------------------+--------+           PSV cm/sEDV cm/sStenosisDescribe                 Comments +----------+--------+--------+--------+-------------------------+--------+ CCA Prox  88      22                                                +----------+--------+--------+--------+-------------------------+--------+ CCA Distal64      20                                                +----------+--------+--------+--------+-------------------------+--------+ ICA Prox  141     47      40-59%  heterogenous and calcific         +----------+--------+--------+--------+-------------------------+--------+ ICA Distal62      23                                                +----------+--------+--------+--------+-------------------------+--------+ ECA       106     17                                                +----------+--------+--------+--------+-------------------------+--------+  Portions of this table do not appear on this page.  +----------+--------+-------+----------------+------------+           PSV cm/sEDV cmsDescribe        Arm Pressure +----------+--------+-------+----------------+------------+ Subclavian137            Multiphasic, WNL             +----------+--------+-------+----------------+------------+ +---------+--------+--+--------+--+---------+ VertebralPSV cm/s63EDV cm/s18Antegrade +---------+--------+--+--------+--+---------+ Left Carotid Findings: +----------+--------+--------+--------+------------+--------+           PSV cm/sEDV cm/sStenosisDescribe    Comments +----------+--------+--------+--------+------------+--------+ CCA Prox  88      15                                   +----------+--------+--------+--------+------------+--------+ CCA Distal77      18                                   +----------+--------+--------+--------+------------+--------+ ICA Prox  61      19      1-39%   heterogenous         +----------+--------+--------+--------+------------+--------+ ICA Distal81      25                                   +----------+--------+--------+--------+------------+--------+ ECA       118     21                                   +----------+--------+--------+--------+------------+--------+ +----------+--------+--------+----------------+------------+ SubclavianPSV cm/sEDV cm/sDescribe        Arm Pressure +----------+--------+--------+----------------+------------+           148             Multiphasic, WNL             +----------+--------+--------+----------------+------------+ +---------+--------+--+--------+--+---------+ VertebralPSV cm/s72EDV cm/s21Antegrade +---------+--------+--+--------+--+---------+  ABI Findings: +---------+------------------+-----+-------------------+--------+ Right    Rt Pressure (mmHg)IndexWaveform           Comment  +---------+------------------+-----+-------------------+--------+ Brachial 133                     triphasic                   +---------+------------------+-----+-------------------+--------+ PTA      83                0.62 dampened monophasic         +---------+------------------+-----+-------------------+--------+ DP       83                0.62 monophasic                  +---------+------------------+-----+-------------------+--------+ Great Toe77                0.58 Abnormal                    +---------+------------------+-----+-------------------+--------+ +---------+------------------+-----+-------------------+-------+ Left     Lt Pressure (mmHg)IndexWaveform           Comment +---------+------------------+-----+-------------------+-------+ Brachial 133                    triphasic                  +---------+------------------+-----+-------------------+-------+ PTA  101               0.76 dampened monophasic        +---------+------------------+-----+-------------------+-------+ DP       104               0.78 monophasic                 +---------+------------------+-----+-------------------+-------+ Great Toe68                0.51 Abnormal                   +---------+------------------+-----+-------------------+-------+ +-------+---------------+----------------+ ABI/TBIToday's ABI/TBIPrevious ABI/TBI +-------+---------------+----------------+ Right  0.62/0.58                       +-------+---------------+----------------+ Left   0.78/0.51                       +-------+---------------+----------------+  Right Doppler Findings: +--------+--------+-----+---------+--------+ Site    PressureIndexDoppler  Comments +--------+--------+-----+---------+--------+ [id number]          triphasic         +--------+--------+-----+---------+--------+ Radial               triphasic         +--------+--------+-----+---------+--------+ Ulnar                triphasic         +--------+--------+-----+---------+--------+   Left Doppler Findings: +--------+--------+-----+---------+--------+ Site    PressureIndexDoppler  Comments +--------+--------+-----+---------+--------+ [id number]          triphasic         +--------+--------+-----+---------+--------+ Radial               triphasic         +--------+--------+-----+---------+--------+ Ulnar                triphasic         +--------+--------+-----+---------+--------+  Summary: Right Carotid: Velocities in the right ICA are consistent with a 40-59%                stenosis. Left Carotid: Velocities in the left ICA are consistent with a 1-39% stenosis. Vertebrals:  Bilateral vertebral arteries demonstrate antegrade flow. Subclavians: Normal flow hemodynamics were seen in bilateral subclavian              arteries. Right ABI: Resting right ankle-brachial index indicates moderate right lower extremity arterial disease. The right toe-brachial index is abnormal. Left ABI: Resting left ankle-brachial index indicates moderate left lower extremity arterial disease. The left toe-brachial index is abnormal. Right Upper Extremity: Doppler waveforms remain within normal limits with right radial compression. Doppler waveforms remain within normal limits with right ulnar compression. Left Upper Extremity: Doppler waveforms remain within normal limits with left radial compression. Doppler waveform obliterate with left ulnar compression.  Electronically signed by Waverly Ferrari MD on 09/21/2020 at 5:51:45 AM.    Final     Cardiac Studies    Previously placed Prox LAD drug eluting stent is widely patent.  Balloon angioplasty was performed.  Prox RCA to Mid RCA lesion is 25% stenosed.  Dist RCA lesion is 10% stenosed.  Non-stenotic Prox RCA lesion was previously treated.  Mid RCA lesion is 99% stenosed.  Mid LAD lesion is 70% stenosed with 0% stenosed side branch in 2nd Diag.   1. The LAD is a large caliber vessel that courses to the apex. The proximal  and mid vessel has a stented segment. There  is a severe restenosis in the mid LAD stented segment in the stent that was placed in may 2021. (DFR=0.85).  2. The Circumflex is small with diffuse plaque 3. The RCA is a large dominant artery with stents from the ostium down through the distal vessel. There is severe restenosis in the mid stented segment. This area has 2 layers of stent and was treated in May 2021 with cutting balloon angioplasty.   Recommendations: He has progression of disease in the stented segments of the LAD and RCA. The LAD stent was placed in may 2021. The mid RCA stented segment has 2 layers of stent and was treated with cutting balloon angioplasty in May 2021. The LAD restenosis is significant by pressure wire analysis. I have reviewed his case with Dr. [place] and we both agree that his best option will be CABG.  He will be admitted to telemetry. He is pain free. Will arrange an echo. Will call CT surgery consult for CABG. Will stop Plavix. Continue ASA. Begin IV heparin 8 hours post sheath pull.        Patient Profile   TORRELL KRUTZ is a 64 y.o. male with history of HTN, HLD, DM on insulin, tobacco abuse and extensive coronary history presents with progressive chest pain  Assessment & Plan    1. CAD: Progressive CAD with in-stent restenosis.  No recurrent chest pain. Plan is for CABG revascularization.  Patient's last dose of Plavix was on Thursday.  Patient was seen by Dr. Donata Clay who plans multivessel bypass grafting after Plavix washout.  P2Y12 test today at 171 c/w significant platelet suppression.  Surgery moved to Tuesday  2. HTN: BP increased on lisinopril 10 mg, HCTZ 25 mg , and metoprolol succinate 25 mg  Pulse in the 70s now but 90s earlier.  Will increase metoprolol to 37.5 mg.  3: Hyperlipidemia: LDL cholesterol 39 on rosuvastatin  4.  DM: On insulin. HbA1c 9.7; needs improved control in future post op.  5.  Obesity: We will need weight loss and good  candidate for cardiac rehabilitation post revascularization.  Signed, Lennette Bihari, MD, [facility] 09/21/2020, 11:59 AM

## 2020-09-21 NOTE — Progress Notes (Signed)
ANTICOAGULATION CONSULT NOTE - Follow Up Consult  Pharmacy Consult for Heparin Indication: chest pain/ACS/ CAD  Assessment:  64 year old male presented 09/19/20 with unstable angina. Hx PCI and LAD stent in May 2021 and s/p cath 10/29 with severe LAD and RCA restenosis. CABG tentatively 11/1. Pharmacy consulted to dose heparin gtt. No report of anticoagulants PTA.   Heparin level this am 0.22 units/ml  Goal of Therapy:  Heparin level 0.3-0.7 units/ml Monitor platelets by anticoagulation protocol: Yes   Plan:  Increase to 1600 units/hr F/u 6hr HL Monitor daily HL, CBC/plt Monitor for signs/symptoms of bleeding    Thanks for allowing pharmacy to be a part of this patient's care.  Talbert Cage, PharmD Clinical Pharmacist

## 2020-09-21 NOTE — Progress Notes (Signed)
2 Days Post-Op Procedure(s) (LRB): LEFT HEART CATH AND CORONARY ANGIOGRAPHY (N/A) INTRAVASCULAR PRESSURE WIRE/FFR STUDY (N/A) Subjective: Feels well on IV heparin Second day of Plavix washout.  P2 Y 12 level today shows significant platelet dysfunction remaining level depressed to 170. We will schedule multivessel CABG for Tuesday a.m.  Objective: Vital signs in last 24 hours: Temp:  [97.7 F (36.5 C)-98.9 F (37.2 C)] 97.7 F (36.5 C) (10/31 0825) Pulse Rate:  [54-79] 79 (10/31 0825) Cardiac Rhythm: Normal sinus rhythm;Heart block (10/31 0700) Resp:  [11-23] 12 (10/31 0825) BP: (111-148)/(74-88) 148/79 (10/31 0825) SpO2:  [82 %-100 %] 93 % (10/31 0825)  Hemodynamic parameters for last 24 hours:    Intake/Output from previous day: 10/30 0701 - 10/31 0700 In: 348.5 [P.O.:240; I.V.:108.5] Out: 2 [Urine:2] Intake/Output this shift: No intake/output data recorded.       Exam    General- alert and comfortable    Neck- no JVD, no cervical adenopathy palpable, no carotid bruit   Lungs- clear without rales, wheezes   Cor- regular rate and rhythm, no murmur , gallop   Abdomen- soft, non-tender   Extremities - warm, non-tender, minimal edema   Neuro- oriented, appropriate, no focal weakness   Lab Results: Recent Labs    09/20/20 0244 09/21/20 0227  WBC 8.4 8.7  HGB 14.8 14.8  HCT 41.9 42.2  PLT 148* 158   BMET:  Recent Labs    09/19/20 0946 09/20/20 0244  NA 137 136  K 3.6 3.9  CL 101 106  CO2 25 21*  GLUCOSE 224* 252*  BUN 12 12  CREATININE 0.90 0.81  CALCIUM 9.6 8.9    PT/INR:  Recent Labs    09/20/20 1736  LABPROT 12.8  INR 1.0   ABG    Component Value Date/Time   TCO2 26 09/07/2010 1128   CBG (last 3)  Recent Labs    09/20/20 1143 09/20/20 1630 09/21/20 0634  GLUCAP 247* 281* 216*    Assessment/Plan: S/P Procedure(s) (LRB): LEFT HEART CATH AND CORONARY ANGIOGRAPHY (N/A) INTRAVASCULAR PRESSURE WIRE/FFR STUDY (N/A) Unstable  angina Day to Plavix washout with still significant Plavix activity by P2 Y 12 Continue IV heparin, improve glucose control, CABG scheduled for Tuesday, November 2 Patient informed   LOS: 2 days    [name] Gray 09/21/2020

## 2020-09-22 ENCOUNTER — Other Ambulatory Visit: Payer: Self-pay | Admitting: *Deleted

## 2020-09-22 ENCOUNTER — Inpatient Hospital Stay (HOSPITAL_COMMUNITY): Payer: BC Managed Care – PPO

## 2020-09-22 ENCOUNTER — Encounter (HOSPITAL_COMMUNITY): Payer: BC Managed Care – PPO

## 2020-09-22 ENCOUNTER — Encounter (HOSPITAL_COMMUNITY): Payer: Self-pay | Admitting: Cardiovascular Disease

## 2020-09-22 DIAGNOSIS — I2510 Atherosclerotic heart disease of native coronary artery without angina pectoris: Secondary | ICD-10-CM

## 2020-09-22 DIAGNOSIS — I200 Unstable angina: Secondary | ICD-10-CM | POA: Diagnosis not present

## 2020-09-22 LAB — PULMONARY FUNCTION TEST
FEF 25-75 Pre: 2.36 L/sec
FEF2575-%Pred-Pre: 81 %
FEV1-%Pred-Pre: 82 %
FEV1-Pre: 2.94 L
FEV1FVC-%Pred-Pre: 102 %
FEV6-%Pred-Pre: 80 %
FEV6-Pre: 3.62 L
FEV6FVC-%Pred-Pre: 99 %
FVC-%Pred-Pre: 81 %
FVC-Pre: 3.82 L
Pre FEV1/FVC ratio: 77 %
Pre FEV6/FVC Ratio: 95 %

## 2020-09-22 LAB — CBC
HCT: 43 % (ref 39.0–52.0)
Hemoglobin: 14.9 g/dL (ref 13.0–17.0)
MCH: 32.1 pg (ref 26.0–34.0)
MCHC: 34.7 g/dL (ref 30.0–36.0)
MCV: 92.7 fL (ref 80.0–100.0)
Platelets: 147 K/uL — ABNORMAL LOW (ref 150–400)
RBC: 4.64 MIL/uL (ref 4.22–5.81)
RDW: 12.2 % (ref 11.5–15.5)
WBC: 8.6 K/uL (ref 4.0–10.5)
nRBC: 0 % (ref 0.0–0.2)

## 2020-09-22 LAB — GLUCOSE, CAPILLARY
Glucose-Capillary: 160 mg/dL — ABNORMAL HIGH (ref 70–99)
Glucose-Capillary: 319 mg/dL — ABNORMAL HIGH (ref 70–99)
Glucose-Capillary: 198 mg/dL — ABNORMAL HIGH (ref 70–99)
Glucose-Capillary: 223 mg/dL — ABNORMAL HIGH (ref 70–99)

## 2020-09-22 LAB — TYPE AND SCREEN
ABO/RH(D): A POS
Antibody Screen: NEGATIVE

## 2020-09-22 LAB — ABO/RH: ABO/RH(D): A POS

## 2020-09-22 LAB — HEPARIN LEVEL (UNFRACTIONATED): Heparin Unfractionated: 0.47 [IU]/mL (ref 0.30–0.70)

## 2020-09-22 LAB — PLATELET INHIBITION P2Y12: Platelet Function  P2Y12: 192 [PRU] (ref 182–335)

## 2020-09-22 MED ORDER — TRANEXAMIC ACID (OHS) BOLUS VIA INFUSION
15.0000 mg/kg | INTRAVENOUS | Status: AC
  Administered 2020-09-23: 1581 mg via INTRAVENOUS
  Filled 2020-09-22: qty 1581

## 2020-09-22 MED ORDER — CHLORHEXIDINE GLUCONATE CLOTH 2 % EX PADS
6.0000 | MEDICATED_PAD | Freq: Once | CUTANEOUS | Status: AC
  Administered 2020-09-23: 6 via TOPICAL

## 2020-09-22 MED ORDER — LIVING WELL WITH DIABETES BOOK
Freq: Once | Status: AC
  Filled 2020-09-22: qty 1

## 2020-09-22 MED ORDER — TEMAZEPAM 15 MG PO CAPS
15.0000 mg | ORAL_CAPSULE | Freq: Once | ORAL | Status: AC | PRN
  Administered 2020-09-22: 15 mg via ORAL
  Filled 2020-09-22: qty 1

## 2020-09-22 MED ORDER — VANCOMYCIN HCL 1500 MG/300ML IV SOLN
1500.0000 mg | INTRAVENOUS | Status: AC
  Administered 2020-09-23: 1500 mg via INTRAVENOUS
  Filled 2020-09-22: qty 300

## 2020-09-22 MED ORDER — SODIUM CHLORIDE 0.9 % IV SOLN
INTRAVENOUS | Status: DC
  Filled 2020-09-22: qty 30

## 2020-09-22 MED ORDER — EPINEPHRINE HCL 5 MG/250ML IV SOLN IN NS
0.0000 ug/min | INTRAVENOUS | Status: DC
  Filled 2020-09-22: qty 250

## 2020-09-22 MED ORDER — POTASSIUM CHLORIDE 2 MEQ/ML IV SOLN
80.0000 meq | INTRAVENOUS | Status: DC
  Filled 2020-09-22: qty 40

## 2020-09-22 MED ORDER — VANCOMYCIN HCL 1250 MG/250ML IV SOLN
1250.0000 mg | INTRAVENOUS | Status: DC
  Filled 2020-09-22: qty 250

## 2020-09-22 MED ORDER — NOREPINEPHRINE 4 MG/250ML-% IV SOLN
0.0000 ug/min | INTRAVENOUS | Status: DC
  Filled 2020-09-22: qty 250

## 2020-09-22 MED ORDER — BISACODYL 5 MG PO TBEC
5.0000 mg | DELAYED_RELEASE_TABLET | Freq: Once | ORAL | Status: DC
  Filled 2020-09-22: qty 1

## 2020-09-22 MED ORDER — MILRINONE LACTATE IN DEXTROSE 20-5 MG/100ML-% IV SOLN
0.3000 ug/kg/min | INTRAVENOUS | Status: DC
  Filled 2020-09-22: qty 100

## 2020-09-22 MED ORDER — SODIUM CHLORIDE 0.9 % IV SOLN
1.5000 g | INTRAVENOUS | Status: AC
  Administered 2020-09-23: 1.5 g via INTRAVENOUS
  Filled 2020-09-22: qty 1.5

## 2020-09-22 MED ORDER — TRANEXAMIC ACID 1000 MG/10ML IV SOLN
1.5000 mg/kg/h | INTRAVENOUS | Status: AC
  Administered 2020-09-23: 1.5 mg/kg/h via INTRAVENOUS
  Filled 2020-09-22: qty 25

## 2020-09-22 MED ORDER — PLASMA-LYTE 148 IV SOLN
INTRAVENOUS | Status: DC
  Filled 2020-09-22: qty 2.5

## 2020-09-22 MED ORDER — CHLORHEXIDINE GLUCONATE 0.12 % MT SOLN
15.0000 mL | Freq: Once | OROMUCOSAL | Status: AC
  Administered 2020-09-23: 15 mL via OROMUCOSAL
  Filled 2020-09-22: qty 15

## 2020-09-22 MED ORDER — INSULIN REGULAR(HUMAN) IN NACL 100-0.9 UT/100ML-% IV SOLN
INTRAVENOUS | Status: AC
  Administered 2020-09-23: 2.2 [IU]/h via INTRAVENOUS
  Filled 2020-09-22: qty 100

## 2020-09-22 MED ORDER — NITROGLYCERIN IN D5W 200-5 MCG/ML-% IV SOLN
2.0000 ug/min | INTRAVENOUS | Status: AC
  Administered 2020-09-23: 16.667 ug/min via INTRAVENOUS
  Filled 2020-09-22: qty 250

## 2020-09-22 MED ORDER — METOPROLOL TARTRATE 12.5 MG HALF TABLET
12.5000 mg | ORAL_TABLET | Freq: Once | ORAL | Status: AC
  Administered 2020-09-23: 12.5 mg via ORAL
  Filled 2020-09-22: qty 1

## 2020-09-22 MED ORDER — PHENYLEPHRINE HCL-NACL 20-0.9 MG/250ML-% IV SOLN
30.0000 ug/min | INTRAVENOUS | Status: AC
  Administered 2020-09-23: 20 ug/min via INTRAVENOUS
  Filled 2020-09-22: qty 250

## 2020-09-22 MED ORDER — MAGNESIUM SULFATE 50 % IJ SOLN
40.0000 meq | INTRAMUSCULAR | Status: DC
  Filled 2020-09-22: qty 9.85

## 2020-09-22 MED ORDER — SODIUM CHLORIDE 0.9 % IV SOLN
750.0000 mg | INTRAVENOUS | Status: AC
  Administered 2020-09-23: 750 mg via INTRAVENOUS
  Filled 2020-09-22: qty 750

## 2020-09-22 MED ORDER — CHLORHEXIDINE GLUCONATE CLOTH 2 % EX PADS
6.0000 | MEDICATED_PAD | Freq: Once | CUTANEOUS | Status: AC
  Administered 2020-09-22: 6 via TOPICAL

## 2020-09-22 MED ORDER — DEXMEDETOMIDINE HCL IN NACL 400 MCG/100ML IV SOLN
0.1000 ug/kg/h | INTRAVENOUS | Status: AC
  Administered 2020-09-23: .3 ug/kg/h via INTRAVENOUS
  Filled 2020-09-22: qty 100

## 2020-09-22 MED ORDER — TRANEXAMIC ACID (OHS) PUMP PRIME SOLUTION
2.0000 mg/kg | INTRAVENOUS | Status: DC
  Filled 2020-09-22: qty 2.11

## 2020-09-22 MED ORDER — INSULIN ASPART 100 UNIT/ML ~~LOC~~ SOLN
3.0000 [IU] | Freq: Three times a day (TID) | SUBCUTANEOUS | Status: DC
  Administered 2020-09-22: 3 [IU] via SUBCUTANEOUS

## 2020-09-22 NOTE — Progress Notes (Addendum)
Inpatient Diabetes Program Recommendations  AACE/ADA: New Consensus Statement on Inpatient Glycemic Control (2015)  Target Ranges:  Prepandial:   less than 140 mg/dL      Peak postprandial:   less than 180 mg/dL (1-2 hours)      Critically ill patients:  140 - 180 mg/dL   Lab Results  Component Value Date   GLUCAP 319 (H) 09/22/2020   HGBA1C 9.7 (H) 09/21/2020    Review of Glycemic Control Results for [name] Gray, [name] Gray (MRN [id number]) as of 09/22/2020 11:51  Ref. Range 09/22/2020 06:32 09/22/2020 11:36  Glucose-Capillary Latest Ref Range: 70 - 99 mg/dL 099 (H) 833 (H)   Diabetes history:  DM2 Outpatient Diabetes medications:  Levemir 25 units daily Metformin 500 mg bid Semaglutide  2mg /1.64ml every Sunday Current orders for Inpatient glycemic control:  Lantus 30 units daily Novolog 0-20 units tid  Inpatient Diabetes Program Recommendations:     Carb modified diet Novolog 3 units tid with meals if eats at least 100%  Addendum @ 1440: Spoke with patient at bedside.  Reviewed patient's current A1c of 9.7% (average blood sugar of 231 mg/dL) . Explained what a A1c is and what it measures. Also reviewed goal A1c with patient, importance of good glucose control @ home, and blood sugar goals.  He states he realizes he needs to improve his diabetes management.  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  He confirms above home medications.  Denies difficulties obtaining insulins or supplies.  He states he is active as he is a Sunday.  Encouraged 15-20 minute walk 4-5 times a week once medically cleared.  Ordered LWWD booklet.  Asked TOC to run a benefit check on SGLT2 Surveyor, minerals as it looks like it is preferred with is insurance.      Will continue to follow while inpatient.  Thank you, Marcelline Deist, RN, BSN Diabetes Coordinator Inpatient Diabetes Program [phone] (team pager from 8a-5p)

## 2020-09-22 NOTE — Plan of Care (Signed)
Continue to monitor

## 2020-09-22 NOTE — TOC Benefit Eligibility Note (Signed)
Transition of Care [facility]) Benefit Eligibility Note    Patient Details  Name: [name] Gray MRN: [id number] Date of Birth: 1956-03-09   Medication/Dose: Wilder Glade  10 MG DAILY  Covered?: Yes  Tier: 3 Drug  Prescription Coverage Preferred Pharmacy: Anitra Lauth  and  Roseanne Kaufman with Person/Company/Phone Number:: KRISTI   @    [employer] #  [phone]  Co-Pay: Johnsie Kindred  Prior Approval: No  Deductible: Met       Memory Argue Phone Number: 09/22/2020, 3:32 PM

## 2020-09-22 NOTE — Progress Notes (Signed)
[id number] Pt has been walking independently without CP. Reviewed staying in the tube and sternal precautions. Encouraged IS and pt able to demonstrate 2250 ml correctly. Discussed the importance of IS and walking after surgery. Pt stated his wife will be available to assist with care after discharge. Pt encouraged to watch pre op video. Has OHS booklet and care guide.  Will follow up after surgery Pt stated he has not smoked since MAY.Luetta Nutting RN BSN 09/22/2020 9:25 AM

## 2020-09-22 NOTE — Progress Notes (Signed)
Progress Note  Patient Name: [name] Gray Date of Encounter: 09/22/2020  [facility] Cardiologist: Prentice DockerSuresh Koneswaran, MD (Inactive)   Subjective   Doing well this morning.  Reports some mild chest pressure yesterday but no symptoms overnight or this morning.  Anxiously awaiting surgery.  Inpatient Medications    Scheduled Meds:  aspirin EC  81 mg Oral Daily   cholecalciferol  1,000 Units Oral Daily   lisinopril  10 mg Oral Daily   And   hydrochlorothiazide  12.5 mg Oral Daily   insulin aspart  0-20 Units Subcutaneous TID WC   insulin detemir  30 Units Subcutaneous QHS   metoprolol succinate  37.5 mg Oral Daily   multivitamin with minerals  1 tablet Oral Daily   mupirocin cream   Topical BID   rosuvastatin  20 mg Oral Daily   sodium chloride flush  3 mL Intravenous Q12H   sodium chloride flush  3 mL Intravenous Q12H   Continuous Infusions:  sodium chloride     sodium chloride     heparin 1,600 Units/hr (09/21/20 2200)   PRN Meds: sodium chloride, sodium chloride, acetaminophen, ondansetron (ZOFRAN) IV, sodium chloride flush, sodium chloride flush   Vital Signs    Vitals:   09/21/20 1628 09/21/20 1915 09/21/20 2329 09/22/20 0514  BP: (!) 149/91 (!) 148/83 131/81 136/87  Pulse: 78 94 83 83  Resp: 17 17 15 16   Temp: 98.1 F (36.7 C) 98.4 F (36.9 C) 97.8 F (36.6 C) 97.8 F (36.6 C)  TempSrc: Oral Oral Oral Oral  SpO2: 100% 96% 97% 96%  Weight:    105.4 kg  Height:        Intake/Output Summary (Last 24 hours) at 09/22/2020 0821 Last data filed at 09/21/2020 2300 Gross per 24 hour  Intake 692.93 ml  Output --  Net 692.93 ml   Last 3 Weights 09/22/2020 09/19/2020 04/16/2020  Weight (lbs) 232 lb 4.8 oz 230 lb 227 lb 9.6 oz  Weight (kg) 105.371 kg 104.327 kg 103.239 kg      Telemetry    Normal sinus rhythm with no significant arrhythmia- Personally Reviewed   Physical Exam  Alert, oriented male GEN: No acute distress.   Neck: No  JVD Cardiac: RRR, no murmurs, rubs, or gallops.  Respiratory: Clear to auscultation bilaterally. GI: Soft, nontender, non-distended  MS: No edema; No deformity. Neuro:  Nonfocal  Psych: Normal affect   Labs    High Sensitivity Troponin:   Recent Labs  Lab 09/19/20 0946 09/19/20 1113 09/19/20 1348  TROPONINIHS 23* 27* 29*      Chemistry Recent Labs  Lab 09/19/20 0946 09/19/20 1113 09/20/20 0244 09/20/20 1736  NA 137  --  136  --   K 3.6  --  3.9  --   CL 101  --  106  --   CO2 25  --  21*  --   GLUCOSE 224*  --  252*  --   BUN 12  --  12  --   CREATININE 0.90  --  0.81  --   CALCIUM 9.6  --  8.9  --   PROT  --  7.5  --  7.2  ALBUMIN  --  4.0  --  3.8  AST  --  19  --  17  ALT  --  21  --  19  ALKPHOS  --  40  --  47  BILITOT  --  0.8  --  0.6  GFRNONAA >  60  --  >60  --   ANIONGAP 11  --  9  --      Hematology Recent Labs  Lab 09/20/20 0244 09/21/20 0227 09/22/20 0244  WBC 8.4 8.7 8.6  RBC 4.46 4.50 4.64  HGB 14.8 14.8 14.9  HCT 41.9 42.2 43.0  MCV 93.9 93.8 92.7  MCH 33.2 32.9 32.1  MCHC 35.3 35.1 34.7  RDW 12.1 12.2 12.2  PLT 148* 158 147*    BNPNo results for input(s): BNP, PROBNP in the last 168 hours.   DDimer No results for input(s): DDIMER in the last 168 hours.   Radiology    ECHOCARDIOGRAM COMPLETE  Result Date: 09/20/2020    ECHOCARDIOGRAM REPORT   Patient Name:   VIDAL LAMPKINS Clute Date of Exam: 09/20/2020 Medical Rec #:  [id number]       Height:       70.0 in Accession #:    [id number]      Weight:       230.0 lb Date of Birth:  1956-10-27       BSA:          2.215 m Patient Age:    64 years        BP:           127/74 mmHg Patient Gender: M               HR:           70 bpm. Exam Location:  Inpatient Procedure: 2D Echo Indications:    CAD Native Vessel I25.10  History:        Patient has no prior history of Echocardiogram examinations.                 Risk Factors:Hypertension, Dyslipidemia and Diabetes.  Sonographer:    Thurman Coyer  RDCS (AE) Referring Phys: [id number] CHRISTOPHER D MCALHANY IMPRESSIONS  1. Left ventricular ejection fraction, by estimation, is 50 to 55%. The left ventricle has low normal function. Difficult to assess wall motion due to incomplete visualization of LV myocardium. Based on limited views, all LV segments appear to have low normal contractility. There is mild concentric left ventricular hypertrophy. Left ventricular diastolic parameters are consistent with Grade I diastolic dysfunction (impaired relaxation).  2. Right ventricular systolic function is mildly reduced. The right ventricular size is normal.  3. The mitral valve is normal in structure. Trivial mitral valve regurgitation.  4. The inferior vena cava is normal in size with greater than 50% respiratory variability, suggesting right atrial pressure of 3 mmHg.  5. The aortic valve is grossly normal. There is mild thickening of the aortic valve. Aortic valve regurgitation is not visualized. Comparison(s): No prior Echocardiogram. FINDINGS  Left Ventricle: Left ventricular ejection fraction, by estimation, is 50 to 55%. The left ventricle has low normal function. Difficult to assess wall motion due to incomplete visualization of LV myocardium. Based on limited views, all LV segments appear  to have low normal contractility. The left ventricular internal cavity size was normal in size. There is mild concentric left ventricular hypertrophy. Left ventricular diastolic parameters are consistent with Grade I diastolic dysfunction (impaired relaxation). Right Ventricle: The right ventricular size is normal. Right vetricular wall thickness was not assessed. Right ventricular systolic function is mildly reduced. Left Atrium: Left atrial size was normal in size. Right Atrium: Right atrial size was normal in size. Pericardium: There is no evidence of pericardial effusion. Mitral Valve: The mitral valve is normal in structure.  There is mild thickening of the mitral valve  leaflet(s). There is mild calcification of the mitral valve leaflet(s). Mild mitral annular calcification. Trivial mitral valve regurgitation. Tricuspid Valve: The tricuspid valve is normal in structure. Tricuspid valve regurgitation is trivial. Aortic Valve: The aortic valve is grossly normal. There is mild thickening of the aortic valve. Aortic valve regurgitation is not visualized. Pulmonic Valve: The pulmonic valve was normal in structure. Pulmonic valve regurgitation is not visualized. Aorta: The aortic root is normal in size and structure. Venous: The inferior vena cava is normal in size with greater than 50% respiratory variability, suggesting right atrial pressure of 3 mmHg. IAS/Shunts: No atrial level shunt detected by color flow Doppler.  LEFT VENTRICLE PLAX 2D LVIDd:         5.40 cm  Diastology LVIDs:         3.90 cm  LV e' medial:    6.53 cm/s LV PW:         1.20 cm  LV E/e' medial:  10.4 LV IVS:        1.20 cm  LV e' lateral:   9.68 cm/s LVOT diam:     2.60 cm  LV E/e' lateral: 7.0 LV SV:         77 LV SV Index:   35 LVOT Area:     5.31 cm  RIGHT VENTRICLE RV S prime:     8.59 cm/s TAPSE (M-mode): 1.4 cm LEFT ATRIUM             Index       RIGHT ATRIUM           Index LA diam:        4.50 cm 2.03 cm/m  RA Area:     15.30 cm LA Vol (A2C):   58.9 ml 26.59 ml/m RA Volume:   38.40 ml  17.34 ml/m LA Vol (A4C):   55.0 ml 24.83 ml/m LA Biplane Vol: 57.1 ml 25.78 ml/m  AORTIC VALVE LVOT Vmax:   65.30 cm/s LVOT Vmean:  44.500 cm/s LVOT VTI:    0.145 m  AORTA Ao Root diam: 3.60 cm MITRAL VALVE MV Area (PHT): 3.08 cm    SHUNTS MV Decel Time: 246 msec    Systemic VTI:  0.14 m MV E velocity: 68.10 cm/s  Systemic Diam: 2.60 cm MV A velocity: 89.50 cm/s MV E/A ratio:  0.76 Laurance Flatten MD Electronically signed by Laurance Flatten MD Signature Date/Time: 09/20/2020/3:44:25 PM    Final    VAS US DOPPLER PRE CABG  Result Date: 09/21/2020 PREOPERATIVE VASCULAR EVALUATION  Indications:      Pre-CABG.  Comparison Study: No prior studies. Performing Technologist: Jean Rosenthal  Examination Guidelines: A complete evaluation includes B-mode imaging, spectral Doppler, color Doppler, and power Doppler as needed of all accessible portions of each vessel. Bilateral testing is considered an integral part of a complete examination. Limited examinations for reoccurring indications may be performed as noted.  Right Carotid Findings: +----------+--------+--------+--------+-------------------------+--------+             PSV cm/s EDV cm/s Stenosis Describe                  Comments  +----------+--------+--------+--------+-------------------------+--------+  CCA Prox   88       22                                                    +----------+--------+--------+--------+-------------------------+--------+  CCA Distal 64       20                                                    +----------+--------+--------+--------+-------------------------+--------+  ICA Prox   141      47       40-59%   heterogenous and calcific           +----------+--------+--------+--------+-------------------------+--------+  ICA Distal 62       23                                                    +----------+--------+--------+--------+-------------------------+--------+  ECA        106      17                                                    +----------+--------+--------+--------+-------------------------+--------+ Portions of this table do not appear on this page. +----------+--------+-------+----------------+------------+             PSV cm/s EDV cms Describe         Arm Pressure  +----------+--------+-------+----------------+------------+  Subclavian 137              Multiphasic, WNL               +----------+--------+-------+----------------+------------+ +---------+--------+--+--------+--+---------+  Vertebral PSV cm/s 63 EDV cm/s 18 Antegrade  +---------+--------+--+--------+--+---------+ Left Carotid Findings:  +----------+--------+--------+--------+------------+--------+             PSV cm/s EDV cm/s Stenosis Describe     Comments  +----------+--------+--------+--------+------------+--------+  CCA Prox   88       15                                       +----------+--------+--------+--------+------------+--------+  CCA Distal 77       18                                       +----------+--------+--------+--------+------------+--------+  ICA Prox   61       19       1-39%    heterogenous           +----------+--------+--------+--------+------------+--------+  ICA Distal 81       25                                       +----------+--------+--------+--------+------------+--------+  ECA        118      21                                       +----------+--------+--------+--------+------------+--------+ +----------+--------+--------+----------------+------------+  Subclavian PSV cm/s EDV cm/s Describe  Arm Pressure  +----------+--------+--------+----------------+------------+             148               Multiphasic, WNL               +----------+--------+--------+----------------+------------+ +---------+--------+--+--------+--+---------+  Vertebral PSV cm/s 72 EDV cm/s 21 Antegrade  +---------+--------+--+--------+--+---------+  ABI Findings: +---------+------------------+-----+-------------------+--------+  Right     Rt Pressure (mmHg) Index Waveform            Comment   +---------+------------------+-----+-------------------+--------+  Brachial  133                      triphasic                     +---------+------------------+-----+-------------------+--------+  PTA       83                 0.62  dampened monophasic           +---------+------------------+-----+-------------------+--------+  DP        83                 0.62  monophasic                    +---------+------------------+-----+-------------------+--------+  Great Toe 77                 0.58  Abnormal                       +---------+------------------+-----+-------------------+--------+ +---------+------------------+-----+-------------------+-------+  Left      Lt Pressure (mmHg) Index Waveform            Comment  +---------+------------------+-----+-------------------+-------+  Brachial  133                      triphasic                    +---------+------------------+-----+-------------------+-------+  PTA       101                0.76  dampened monophasic          +---------+------------------+-----+-------------------+-------+  DP        104                0.78  monophasic                   +---------+------------------+-----+-------------------+-------+  Great Toe 68                 0.51  Abnormal                     +---------+------------------+-----+-------------------+-------+ +-------+---------------+----------------+  ABI/TBI Today's ABI/TBI Previous ABI/TBI  +-------+---------------+----------------+  Right   0.62/0.58                         +-------+---------------+----------------+  Left    0.78/0.51                         +-------+---------------+----------------+  Right Doppler Findings: +--------+--------+-----+---------+--------+  Site     Pressure Index Doppler   Comments  +--------+--------+-----+---------+--------+  Brachial 133            triphasic           +--------+--------+-----+---------+--------+  Radial  triphasic           +--------+--------+-----+---------+--------+  Ulnar                   triphasic           +--------+--------+-----+---------+--------+  Left Doppler Findings: +--------+--------+-----+---------+--------+  Site     Pressure Index Doppler   Comments  +--------+--------+-----+---------+--------+  Brachial 133            triphasic           +--------+--------+-----+---------+--------+  Radial                  triphasic           +--------+--------+-----+---------+--------+  Ulnar                   triphasic           +--------+--------+-----+---------+--------+   Summary: Right Carotid: Velocities in the right ICA are consistent with a 40-59%                stenosis. Left Carotid: Velocities in the left ICA are consistent with a 1-39% stenosis. Vertebrals:  Bilateral vertebral arteries demonstrate antegrade flow. Subclavians: Normal flow hemodynamics were seen in bilateral subclavian              arteries. Right ABI: Resting right ankle-brachial index indicates moderate right lower extremity arterial disease. The right toe-brachial index is abnormal. Left ABI: Resting left ankle-brachial index indicates moderate left lower extremity arterial disease. The left toe-brachial index is abnormal. Right Upper Extremity: Doppler waveforms remain within normal limits with right radial compression. Doppler waveforms remain within normal limits with right ulnar compression. Left Upper Extremity: Doppler waveforms remain within normal limits with left radial compression. Doppler waveform obliterate with left ulnar compression.  Electronically signed by Waverly Ferrari MD on 09/21/2020 at 5:51:45 AM.    Final      Patient Profile     64 y.o. male with hypertension, mixed hyperlipidemia, type 2 diabetes, and extensive coronary artery disease presents with unstable angina, found to have severe in-stent restenosis in the LAD and right coronary arteries, referred for CABG  Assessment & Plan    1.  Unstable angina: Progressive coronary artery disease noted with recurrent in-stent restenosis in the RCA and significant in-stent restenosis in the LAD from a recently placed stent.  Plan is for CABG.  Awaiting Plavix washout (last dose Thursday).  P2 Y 12 192 today.  Anticipate surgery tomorrow per notes.  Continue IV heparin, aspirin, metoprolol succinate, and rosuvastatin. 2.  Mixed hyperlipidemia: Treated with a high intensity statin drug, LDL cholesterol at goal less than 55. 3.  Hypertension: Blood pressure controlled on current therapy  Disposition: Pending cardiac surgery  tomorrow  For questions or updates, please contact [facility] Please consult [email or web address] for contact info under     Signed, Tonny Bollman, MD  09/22/2020, 8:21 AM

## 2020-09-22 NOTE — Progress Notes (Signed)
ANTICOAGULATION CONSULT NOTE - Follow Up Consult  Pharmacy Consult for Heparin Indication: chest pain/ACS/ CAD  No Known Allergies  Patient Measurements: Height: 5\' 10"  (177.8 cm) Weight: 105.4 kg (232 lb 4.8 oz) IBW/kg (Calculated) : 73 Heparin Dosing Weight: 95.2 kg  Vital Signs: Temp: 97.8 F (36.6 C) (11/01 0514) Temp Source: Oral (11/01 0514) BP: 136/87 (11/01 0514) Pulse Rate: 83 (11/01 0514)  Labs: Recent Labs     0000 09/19/20 0946 09/19/20 0946 09/19/20 1113 09/19/20 1348 09/20/20 0244 09/20/20 1736 09/20/20 1736 09/21/20 0227 09/21/20 1222 09/22/20 0244  HGB   < > 16.8   < >  --   --  14.8  --   --  14.8  --  14.9  HCT  --  49.4   < >  --   --  41.9  --   --  42.2  --  43.0  PLT  --  172   < >  --   --  148*  --   --  158  --  147*  LABPROT  --   --   --   --   --   --  12.8  --   --   --   --   INR  --   --   --   --   --   --  1.0  --   --   --   --   HEPARINUNFRC  --   --   --   --   --   --  0.15*   < > 0.22* 0.54 0.47  CREATININE  --  0.90  --   --   --  0.81  --   --   --   --   --   TROPONINIHS  --  23*  --  27* 29*  --   --   --   --   --   --    < > = values in this interval not displayed.    Estimated Creatinine Clearance: 115 mL/min (by C-G formula based on SCr of 0.81 mg/dL).  Assessment:  64 year old male presented 09/19/20 with unstable angina. Hx PCI and LAD stent in May 2021.  Pharmacy consulted to dose heparin gtt. No report of anticoagulants PTA.   Heparin level = 0.47, therapeutic. Hemoglobin stable, PLT WNL. No signs of bleeding noted.  Goal of Therapy:  Heparin level 0.3-0.7 units/ml Monitor platelets by anticoagulation protocol: Yes   Plan:  - Continue heparin at 1,600 units/hr - Monitor daily heparin level and CBC, signs of bleeding - Follow plans for surgery on 11/2  13/2 PharmD., BCPS Clinical Pharmacist 09/22/2020 7:44 AM

## 2020-09-23 ENCOUNTER — Other Ambulatory Visit: Payer: Self-pay | Admitting: Physician Assistant

## 2020-09-23 ENCOUNTER — Inpatient Hospital Stay (HOSPITAL_COMMUNITY): Payer: BC Managed Care – PPO | Admitting: Anesthesiology

## 2020-09-23 ENCOUNTER — Encounter (HOSPITAL_COMMUNITY): Payer: Self-pay | Admitting: Cardiology

## 2020-09-23 ENCOUNTER — Inpatient Hospital Stay (HOSPITAL_COMMUNITY): Payer: BC Managed Care – PPO

## 2020-09-23 ENCOUNTER — Inpatient Hospital Stay (HOSPITAL_COMMUNITY)
Admission: EM | Disposition: A | Discharge status: Home / Self Care | Payer: Self-pay | Source: Home / Self Care | Attending: Cardiothoracic Surgery

## 2020-09-23 DIAGNOSIS — I25110 Atherosclerotic heart disease of native coronary artery with unstable angina pectoris: Secondary | ICD-10-CM

## 2020-09-23 DIAGNOSIS — Z951 Presence of aortocoronary bypass graft: Secondary | ICD-10-CM

## 2020-09-23 HISTORY — PX: RADIAL ARTERY HARVEST: SHX5067

## 2020-09-23 HISTORY — PX: TEE WITHOUT CARDIOVERSION: SHX5443

## 2020-09-23 HISTORY — PX: CORONARY ARTERY BYPASS GRAFT: SHX141

## 2020-09-23 LAB — POCT I-STAT 7, (LYTES, BLD GAS, ICA,H+H)
Acid-Base Excess: 6 mmol/L — ABNORMAL HIGH (ref 0.0–2.0)
Acid-Base Excess: 2 mmol/L (ref 0.0–2.0)
Acid-base deficit: 1 mmol/L (ref 0.0–2.0)
Acid-base deficit: 3 mmol/L — ABNORMAL HIGH (ref 0.0–2.0)
Acid-base deficit: 4 mmol/L — ABNORMAL HIGH (ref 0.0–2.0)
Acid-base deficit: 5 mmol/L — ABNORMAL HIGH (ref 0.0–2.0)
Acid-base deficit: 4 mmol/L — ABNORMAL HIGH (ref 0.0–2.0)
Bicarbonate: 25.1 mmol/L (ref 20.0–28.0)
Bicarbonate: 32.1 mmol/L — ABNORMAL HIGH (ref 20.0–28.0)
Bicarbonate: 25.8 mmol/L (ref 20.0–28.0)
Bicarbonate: 23.1 mmol/L (ref 20.0–28.0)
Bicarbonate: 20.8 mmol/L (ref 20.0–28.0)
Bicarbonate: 19.3 mmol/L — ABNORMAL LOW (ref 20.0–28.0)
Bicarbonate: 19.7 mmol/L — ABNORMAL LOW (ref 20.0–28.0)
Calcium, Ion: 1.03 mmol/L — ABNORMAL LOW (ref 1.15–1.40)
Calcium, Ion: 1.04 mmol/L — ABNORMAL LOW (ref 1.15–1.40)
Calcium, Ion: 1.07 mmol/L — ABNORMAL LOW (ref 1.15–1.40)
Calcium, Ion: 1.11 mmol/L — ABNORMAL LOW (ref 1.15–1.40)
Calcium, Ion: 1.14 mmol/L — ABNORMAL LOW (ref 1.15–1.40)
Calcium, Ion: 1.14 mmol/L — ABNORMAL LOW (ref 1.15–1.40)
Calcium, Ion: 1.11 mmol/L — ABNORMAL LOW (ref 1.15–1.40)
HCT: 29 % — ABNORMAL LOW (ref 39.0–52.0)
HCT: 30 % — ABNORMAL LOW (ref 39.0–52.0)
HCT: 28 % — ABNORMAL LOW (ref 39.0–52.0)
HCT: 33 % — ABNORMAL LOW (ref 39.0–52.0)
HCT: 36 % — ABNORMAL LOW (ref 39.0–52.0)
HCT: 36 % — ABNORMAL LOW (ref 39.0–52.0)
HCT: 37 % — ABNORMAL LOW (ref 39.0–52.0)
Hemoglobin: 10.2 g/dL — ABNORMAL LOW (ref 13.0–17.0)
Hemoglobin: 9.9 g/dL — ABNORMAL LOW (ref 13.0–17.0)
Hemoglobin: 9.5 g/dL — ABNORMAL LOW (ref 13.0–17.0)
Hemoglobin: 11.2 g/dL — ABNORMAL LOW (ref 13.0–17.0)
Hemoglobin: 12.2 g/dL — ABNORMAL LOW (ref 13.0–17.0)
Hemoglobin: 12.2 g/dL — ABNORMAL LOW (ref 13.0–17.0)
Hemoglobin: 12.6 g/dL — ABNORMAL LOW (ref 13.0–17.0)
O2 Saturation: 100 %
O2 Saturation: 82 %
O2 Saturation: 100 %
O2 Saturation: 90 %
O2 Saturation: 95 %
O2 Saturation: 84 %
O2 Saturation: 88 %
Patient temperature: 35.6
Patient temperature: 36.7
Patient temperature: 37.3
Patient temperature: 37.4
Potassium: 3.3 mmol/L — ABNORMAL LOW (ref 3.5–5.1)
Potassium: 3.8 mmol/L (ref 3.5–5.1)
Potassium: 4.4 mmol/L (ref 3.5–5.1)
Potassium: 3.9 mmol/L (ref 3.5–5.1)
Potassium: 4.3 mmol/L (ref 3.5–5.1)
Potassium: 4.3 mmol/L (ref 3.5–5.1)
Potassium: 4.1 mmol/L (ref 3.5–5.1)
Sodium: 141 mmol/L (ref 135–145)
Sodium: 148 mmol/L — ABNORMAL HIGH (ref 135–145)
Sodium: 141 mmol/L (ref 135–145)
Sodium: 144 mmol/L (ref 135–145)
Sodium: 142 mmol/L (ref 135–145)
Sodium: 141 mmol/L (ref 135–145)
Sodium: 141 mmol/L (ref 135–145)
TCO2: 26 mmol/L (ref 22–32)
TCO2: 34 mmol/L — ABNORMAL HIGH (ref 22–32)
TCO2: 27 mmol/L (ref 22–32)
TCO2: 25 mmol/L (ref 22–32)
TCO2: 22 mmol/L (ref 22–32)
TCO2: 20 mmol/L — ABNORMAL LOW (ref 22–32)
TCO2: 21 mmol/L — ABNORMAL LOW (ref 22–32)
pCO2 arterial: 46.1 mmHg (ref 32.0–48.0)
pCO2 arterial: 53.8 mmHg — ABNORMAL HIGH (ref 32.0–48.0)
pCO2 arterial: 37.5 mmHg (ref 32.0–48.0)
pCO2 arterial: 44.1 mmHg (ref 32.0–48.0)
pCO2 arterial: 36.7 mmHg (ref 32.0–48.0)
pCO2 arterial: 33.7 mmHg (ref 32.0–48.0)
pCO2 arterial: 33.4 mmHg (ref 32.0–48.0)
pH, Arterial: 7.344 — ABNORMAL LOW (ref 7.350–7.450)
pH, Arterial: 7.384 (ref 7.350–7.450)
pH, Arterial: 7.445 (ref 7.350–7.450)
pH, Arterial: 7.321 — ABNORMAL LOW (ref 7.350–7.450)
pH, Arterial: 7.359 (ref 7.350–7.450)
pH, Arterial: 7.367 (ref 7.350–7.450)
pH, Arterial: 7.381 (ref 7.350–7.450)
pO2, Arterial: 293 mmHg — ABNORMAL HIGH (ref 83.0–108.0)
pO2, Arterial: 48 mmHg — ABNORMAL LOW (ref 83.0–108.0)
pO2, Arterial: 331 mmHg — ABNORMAL HIGH (ref 83.0–108.0)
pO2, Arterial: 59 mmHg — ABNORMAL LOW (ref 83.0–108.0)
pO2, Arterial: 76 mmHg — ABNORMAL LOW (ref 83.0–108.0)
pO2, Arterial: 50 mmHg — ABNORMAL LOW (ref 83.0–108.0)
pO2, Arterial: 56 mmHg — ABNORMAL LOW (ref 83.0–108.0)

## 2020-09-23 LAB — POCT I-STAT, CHEM 8
BUN: 10 mg/dL (ref 8–23)
BUN: 10 mg/dL (ref 8–23)
BUN: 9 mg/dL (ref 8–23)
BUN: 9 mg/dL (ref 8–23)
Calcium, Ion: 1.29 mmol/L (ref 1.15–1.40)
Calcium, Ion: 1.16 mmol/L (ref 1.15–1.40)
Calcium, Ion: 1.02 mmol/L — ABNORMAL LOW (ref 1.15–1.40)
Calcium, Ion: 1.1 mmol/L — ABNORMAL LOW (ref 1.15–1.40)
Chloride: 103 mmol/L (ref 98–111)
Chloride: 103 mmol/L (ref 98–111)
Chloride: 103 mmol/L (ref 98–111)
Chloride: 103 mmol/L (ref 98–111)
Creatinine, Ser: 0.6 mg/dL — ABNORMAL LOW (ref 0.61–1.24)
Creatinine, Ser: 0.6 mg/dL — ABNORMAL LOW (ref 0.61–1.24)
Creatinine, Ser: 0.6 mg/dL — ABNORMAL LOW (ref 0.61–1.24)
Creatinine, Ser: 0.5 mg/dL — ABNORMAL LOW (ref 0.61–1.24)
Glucose, Bld: 111 mg/dL — ABNORMAL HIGH (ref 70–99)
Glucose, Bld: 117 mg/dL — ABNORMAL HIGH (ref 70–99)
Glucose, Bld: 115 mg/dL — ABNORMAL HIGH (ref 70–99)
Glucose, Bld: 141 mg/dL — ABNORMAL HIGH (ref 70–99)
HCT: 40 % (ref 39.0–52.0)
HCT: 38 % — ABNORMAL LOW (ref 39.0–52.0)
HCT: 30 % — ABNORMAL LOW (ref 39.0–52.0)
HCT: 28 % — ABNORMAL LOW (ref 39.0–52.0)
Hemoglobin: 13.6 g/dL (ref 13.0–17.0)
Hemoglobin: 12.9 g/dL — ABNORMAL LOW (ref 13.0–17.0)
Hemoglobin: 10.2 g/dL — ABNORMAL LOW (ref 13.0–17.0)
Hemoglobin: 9.5 g/dL — ABNORMAL LOW (ref 13.0–17.0)
Potassium: 3.7 mmol/L (ref 3.5–5.1)
Potassium: 3.8 mmol/L (ref 3.5–5.1)
Potassium: 4.2 mmol/L (ref 3.5–5.1)
Potassium: 4 mmol/L (ref 3.5–5.1)
Sodium: 142 mmol/L (ref 135–145)
Sodium: 140 mmol/L (ref 135–145)
Sodium: 139 mmol/L (ref 135–145)
Sodium: 141 mmol/L (ref 135–145)
TCO2: 23 mmol/L (ref 22–32)
TCO2: 24 mmol/L (ref 22–32)
TCO2: 26 mmol/L (ref 22–32)
TCO2: 23 mmol/L (ref 22–32)

## 2020-09-23 LAB — CBC
HCT: 45.1 % (ref 39.0–52.0)
HCT: 34.7 % — ABNORMAL LOW (ref 39.0–52.0)
HCT: 38.1 % — ABNORMAL LOW (ref 39.0–52.0)
Hemoglobin: 15.4 g/dL (ref 13.0–17.0)
Hemoglobin: 11.9 g/dL — ABNORMAL LOW (ref 13.0–17.0)
Hemoglobin: 12.9 g/dL — ABNORMAL LOW (ref 13.0–17.0)
MCH: 32 pg (ref 26.0–34.0)
MCH: 33 pg (ref 26.0–34.0)
MCH: 32.4 pg (ref 26.0–34.0)
MCHC: 34.1 g/dL (ref 30.0–36.0)
MCHC: 34.3 g/dL (ref 30.0–36.0)
MCHC: 33.9 g/dL (ref 30.0–36.0)
MCV: 93.8 fL (ref 80.0–100.0)
MCV: 96.1 fL (ref 80.0–100.0)
MCV: 95.7 fL (ref 80.0–100.0)
Platelets: 151 10*3/uL (ref 150–400)
Platelets: 103 10*3/uL — ABNORMAL LOW (ref 150–400)
Platelets: 131 10*3/uL — ABNORMAL LOW (ref 150–400)
RBC: 4.81 MIL/uL (ref 4.22–5.81)
RBC: 3.61 MIL/uL — ABNORMAL LOW (ref 4.22–5.81)
RBC: 3.98 MIL/uL — ABNORMAL LOW (ref 4.22–5.81)
RDW: 12.3 % (ref 11.5–15.5)
RDW: 12.4 % (ref 11.5–15.5)
RDW: 12.6 % (ref 11.5–15.5)
WBC: 7.5 10*3/uL (ref 4.0–10.5)
WBC: 16.4 10*3/uL — ABNORMAL HIGH (ref 4.0–10.5)
WBC: 17.3 10*3/uL — ABNORMAL HIGH (ref 4.0–10.5)
nRBC: 0 % (ref 0.0–0.2)
nRBC: 0 % (ref 0.0–0.2)
nRBC: 0 % (ref 0.0–0.2)

## 2020-09-23 LAB — GLUCOSE, CAPILLARY
Glucose-Capillary: 201 mg/dL — ABNORMAL HIGH (ref 70–99)
Glucose-Capillary: 173 mg/dL — ABNORMAL HIGH (ref 70–99)
Glucose-Capillary: 122 mg/dL — ABNORMAL HIGH (ref 70–99)
Glucose-Capillary: 114 mg/dL — ABNORMAL HIGH (ref 70–99)
Glucose-Capillary: 131 mg/dL — ABNORMAL HIGH (ref 70–99)
Glucose-Capillary: 134 mg/dL — ABNORMAL HIGH (ref 70–99)
Glucose-Capillary: 119 mg/dL — ABNORMAL HIGH (ref 70–99)
Glucose-Capillary: 143 mg/dL — ABNORMAL HIGH (ref 70–99)
Glucose-Capillary: 141 mg/dL — ABNORMAL HIGH (ref 70–99)
Glucose-Capillary: 144 mg/dL — ABNORMAL HIGH (ref 70–99)
Glucose-Capillary: 126 mg/dL — ABNORMAL HIGH (ref 70–99)

## 2020-09-23 LAB — HEMOGLOBIN AND HEMATOCRIT, BLOOD
HCT: 28.8 % — ABNORMAL LOW (ref 39.0–52.0)
Hemoglobin: 10 g/dL — ABNORMAL LOW (ref 13.0–17.0)

## 2020-09-23 LAB — BASIC METABOLIC PANEL
Anion gap: 11 (ref 5–15)
Anion gap: 9 (ref 5–15)
BUN: 12 mg/dL (ref 8–23)
BUN: 8 mg/dL (ref 8–23)
CO2: 23 mmol/L (ref 22–32)
CO2: 18 mmol/L — ABNORMAL LOW (ref 22–32)
Calcium: 9.5 mg/dL (ref 8.9–10.3)
Calcium: 7.6 mg/dL — ABNORMAL LOW (ref 8.9–10.3)
Chloride: 104 mmol/L (ref 98–111)
Chloride: 109 mmol/L (ref 98–111)
Creatinine, Ser: 0.79 mg/dL (ref 0.61–1.24)
Creatinine, Ser: 0.79 mg/dL (ref 0.61–1.24)
GFR, Estimated: >60 mL/min (ref >60)
GFR, Estimated: >60 mL/min (ref >60)
Glucose, Bld: 204 mg/dL — ABNORMAL HIGH (ref 70–99)
Glucose, Bld: 154 mg/dL — ABNORMAL HIGH (ref 70–99)
Potassium: 3.8 mmol/L (ref 3.5–5.1)
Potassium: 3.8 mmol/L (ref 3.5–5.1)
Sodium: 138 mmol/L (ref 135–145)
Sodium: 136 mmol/L (ref 135–145)

## 2020-09-23 LAB — PROTIME-INR
INR: 1.4 — ABNORMAL HIGH (ref 0.8–1.2)
Prothrombin Time: 16.3 seconds — ABNORMAL HIGH (ref 11.4–15.2)

## 2020-09-23 LAB — APTT: aPTT: 28 seconds (ref 24–36)

## 2020-09-23 LAB — PLATELET COUNT: Platelets: 136 10*3/uL — ABNORMAL LOW (ref 150–400)

## 2020-09-23 LAB — MAGNESIUM: Magnesium: 2.4 mg/dL (ref 1.7–2.4)

## 2020-09-23 SURGERY — CORONARY ARTERY BYPASS GRAFTING (CABG)
Anesthesia: General | Site: Chest

## 2020-09-23 MED ORDER — SODIUM CHLORIDE 0.9 % IV SOLN: 250.0000 mL | INTRAVENOUS | Status: DC

## 2020-09-23 MED ORDER — FENTANYL CITRATE (PF) 250 MCG/5ML IJ SOLN
INTRAMUSCULAR | Status: AC
  Filled 2020-09-23: qty 25

## 2020-09-23 MED ORDER — ALBUMIN HUMAN 5 % IV SOLN: INTRAVENOUS | Status: DC | PRN

## 2020-09-23 MED ORDER — PROTAMINE SULFATE 10 MG/ML IV SOLN
INTRAVENOUS | Status: DC | PRN
  Administered 2020-09-23 (×3): 100 mg via INTRAVENOUS

## 2020-09-23 MED ORDER — HEPARIN SODIUM (PORCINE) 1000 UNIT/ML IJ SOLN
INTRAMUSCULAR | Status: DC | PRN
  Administered 2020-09-23: 29000 [IU] via INTRAVENOUS
  Administered 2020-09-23: 3000 [IU] via INTRAVENOUS

## 2020-09-23 MED ORDER — PHENYLEPHRINE HCL-NACL 20-0.9 MG/250ML-% IV SOLN
0.0000 ug/min | INTRAVENOUS | Status: DC
  Administered 2020-09-24: 20 ug/min via INTRAVENOUS
  Filled 2020-09-23: qty 250

## 2020-09-23 MED ORDER — PLATELET RICH PLASMA OPTIME
Status: DC | PRN
  Administered 2020-09-23: 10 mL

## 2020-09-23 MED ORDER — PLATELET POOR PLASMA OPTIME
Status: DC | PRN
  Administered 2020-09-23: 10 mL

## 2020-09-23 MED ORDER — TRANEXAMIC ACID 1000 MG/10ML IV SOLN
1.5000 mg/kg/h | INTRAVENOUS | Status: DC
  Filled 2020-09-23: qty 25

## 2020-09-23 MED ORDER — SODIUM BICARBONATE 8.4 % IV SOLN
100.0000 meq | Freq: Once | INTRAVENOUS | Status: AC
  Administered 2020-09-23: 100 meq via INTRAVENOUS

## 2020-09-23 MED ORDER — EPHEDRINE SULFATE-NACL 50-0.9 MG/10ML-% IV SOSY
PREFILLED_SYRINGE | INTRAVENOUS | Status: DC | PRN
  Administered 2020-09-23: 5 mg via INTRAVENOUS
  Administered 2020-09-23 (×2): 10 mg via INTRAVENOUS

## 2020-09-23 MED ORDER — DOCUSATE SODIUM 100 MG PO CAPS
200.0000 mg | ORAL_CAPSULE | Freq: Every day | ORAL | Status: DC
  Administered 2020-09-24 – 2020-09-27 (×4): 200 mg via ORAL
  Filled 2020-09-23 (×5): qty 2

## 2020-09-23 MED ORDER — BISACODYL 5 MG PO TBEC
10.0000 mg | DELAYED_RELEASE_TABLET | Freq: Every day | ORAL | Status: DC
  Administered 2020-09-24 – 2020-09-27 (×3): 10 mg via ORAL
  Filled 2020-09-23 (×5): qty 2

## 2020-09-23 MED ORDER — FAMOTIDINE IN NACL 20-0.9 MG/50ML-% IV SOLN
20.0000 mg | Freq: Two times a day (BID) | INTRAVENOUS | Status: AC
  Administered 2020-09-23: 20 mg via INTRAVENOUS

## 2020-09-23 MED ORDER — FENTANYL CITRATE (PF) 250 MCG/5ML IJ SOLN
INTRAMUSCULAR | Status: AC
  Filled 2020-09-23: qty 5

## 2020-09-23 MED ORDER — FAMOTIDINE IN NACL 20-0.9 MG/50ML-% IV SOLN
INTRAVENOUS | Status: AC
  Filled 2020-09-23: qty 50

## 2020-09-23 MED ORDER — THROMBIN 5000 UNITS EX SOLR
INTRAVENOUS | Status: DC | PRN
  Administered 2020-09-23: 1 mL

## 2020-09-23 MED ORDER — ACETAMINOPHEN 160 MG/5ML PO SOLN: 1000.0000 mg | Freq: Four times a day (QID) | ORAL | Status: DC

## 2020-09-23 MED ORDER — HEPARIN SODIUM (PORCINE) 1000 UNIT/ML IJ SOLN
INTRAMUSCULAR | Status: AC
  Filled 2020-09-23: qty 1

## 2020-09-23 MED ORDER — OXYCODONE HCL 5 MG PO TABS
5.0000 mg | ORAL_TABLET | ORAL | Status: DC | PRN
  Administered 2020-09-24: 10 mg via ORAL
  Filled 2020-09-23: qty 2

## 2020-09-23 MED ORDER — DEXMEDETOMIDINE HCL IN NACL 200 MCG/50ML IV SOLN
0.4000 ug/kg/h | INTRAVENOUS | Status: DC
  Filled 2020-09-23: qty 50

## 2020-09-23 MED ORDER — ACETAMINOPHEN 650 MG RE SUPP
650.0000 mg | Freq: Once | RECTAL | Status: AC
  Administered 2020-09-23: 650 mg via RECTAL

## 2020-09-23 MED ORDER — ONDANSETRON HCL 4 MG/2ML IJ SOLN
INTRAMUSCULAR | Status: AC
  Filled 2020-09-23: qty 2

## 2020-09-23 MED ORDER — ASPIRIN 81 MG PO CHEW: 324.0000 mg | CHEWABLE_TABLET | Freq: Every day | ORAL | Status: DC

## 2020-09-23 MED ORDER — PROPOFOL 10 MG/ML IV BOLUS
INTRAVENOUS | Status: AC
  Filled 2020-09-23: qty 20

## 2020-09-23 MED ORDER — TRAMADOL HCL 50 MG PO TABS
50.0000 mg | ORAL_TABLET | ORAL | Status: DC | PRN
  Administered 2020-09-23 – 2020-09-26 (×5): 100 mg via ORAL
  Filled 2020-09-23 (×5): qty 2

## 2020-09-23 MED ORDER — PHENYLEPHRINE 40 MCG/ML (10ML) SYRINGE FOR IV PUSH (FOR BLOOD PRESSURE SUPPORT)
PREFILLED_SYRINGE | INTRAVENOUS | Status: AC
  Filled 2020-09-23: qty 20

## 2020-09-23 MED ORDER — PANTOPRAZOLE SODIUM 40 MG PO TBEC
40.0000 mg | DELAYED_RELEASE_TABLET | Freq: Every day | ORAL | Status: DC
  Administered 2020-09-25 – 2020-09-28 (×4): 40 mg via ORAL
  Filled 2020-09-23 (×4): qty 1

## 2020-09-23 MED ORDER — LACTATED RINGERS IV SOLN: INTRAVENOUS | Status: DC | PRN

## 2020-09-23 MED ORDER — MAGNESIUM SULFATE 4 GM/100ML IV SOLN
INTRAVENOUS | Status: AC
  Filled 2020-09-23: qty 100

## 2020-09-23 MED ORDER — MIDAZOLAM HCL 2 MG/2ML IJ SOLN: 2.0000 mg | INTRAMUSCULAR | Status: DC | PRN

## 2020-09-23 MED ORDER — SODIUM CHLORIDE 0.9% FLUSH: 10.0000 mL | INTRAVENOUS | Status: DC | PRN

## 2020-09-23 MED ORDER — INSULIN REGULAR(HUMAN) IN NACL 100-0.9 UT/100ML-% IV SOLN
INTRAVENOUS | Status: DC
  Filled 2020-09-23: qty 100

## 2020-09-23 MED ORDER — VANCOMYCIN HCL IN DEXTROSE 1-5 GM/200ML-% IV SOLN
1000.0000 mg | Freq: Once | INTRAVENOUS | Status: AC
  Administered 2020-09-23: 1000 mg via INTRAVENOUS
  Filled 2020-09-23: qty 200

## 2020-09-23 MED ORDER — VANCOMYCIN HCL 1000 MG IV SOLR
INTRAVENOUS | Status: DC | PRN
  Administered 2020-09-23: 3000 mg via TOPICAL

## 2020-09-23 MED ORDER — ALBUMIN HUMAN 5 % IV SOLN
250.0000 mL | INTRAVENOUS | Status: AC | PRN
  Administered 2020-09-23: 12.5 g via INTRAVENOUS

## 2020-09-23 MED ORDER — ROCURONIUM BROMIDE 10 MG/ML (PF) SYRINGE
PREFILLED_SYRINGE | INTRAVENOUS | Status: AC
  Filled 2020-09-23: qty 20

## 2020-09-23 MED ORDER — SODIUM CHLORIDE (PF) 0.9 % IJ SOLN
OROMUCOSAL | Status: DC | PRN
  Administered 2020-09-23 (×2): 4 mL via TOPICAL

## 2020-09-23 MED ORDER — VANCOMYCIN HCL 1000 MG IV SOLR
INTRAVENOUS | Status: AC
  Filled 2020-09-23: qty 3000

## 2020-09-23 MED ORDER — PLASMA-LYTE 148 IV SOLN
INTRAVENOUS | Status: DC | PRN
  Administered 2020-09-23: 500 mL via INTRAVASCULAR

## 2020-09-23 MED ORDER — CHLORHEXIDINE GLUCONATE 0.12 % MT SOLN
15.0000 mL | OROMUCOSAL | Status: AC
  Administered 2020-09-23: 15 mL via OROMUCOSAL

## 2020-09-23 MED ORDER — SODIUM CHLORIDE 0.9 % IV SOLN: INTRAVENOUS | Status: DC

## 2020-09-23 MED ORDER — NITROGLYCERIN IN D5W 200-5 MCG/ML-% IV SOLN: 0.0000 ug/min | INTRAVENOUS | Status: DC

## 2020-09-23 MED ORDER — ACETAMINOPHEN 500 MG PO TABS
1000.0000 mg | ORAL_TABLET | Freq: Four times a day (QID) | ORAL | Status: DC
  Administered 2020-09-24 – 2020-09-28 (×17): 1000 mg via ORAL
  Filled 2020-09-23 (×18): qty 2

## 2020-09-23 MED ORDER — DEXMEDETOMIDINE HCL IN NACL 400 MCG/100ML IV SOLN: 0.0000 ug/kg/h | INTRAVENOUS | Status: DC

## 2020-09-23 MED ORDER — ONDANSETRON HCL 4 MG/2ML IJ SOLN
INTRAMUSCULAR | Status: DC | PRN
  Administered 2020-09-23: 4 mg via INTRAVENOUS

## 2020-09-23 MED ORDER — BUPIVACAINE LIPOSOME 1.3 % IJ SUSP
20.0000 mL | INTRAMUSCULAR | Status: DC
  Filled 2020-09-23: qty 20

## 2020-09-23 MED ORDER — BUPIVACAINE HCL (PF) 0.5 % IJ SOLN
INTRAMUSCULAR | Status: AC
  Filled 2020-09-23: qty 30

## 2020-09-23 MED ORDER — PHENYLEPHRINE 40 MCG/ML (10ML) SYRINGE FOR IV PUSH (FOR BLOOD PRESSURE SUPPORT)
PREFILLED_SYRINGE | INTRAVENOUS | Status: DC | PRN
  Administered 2020-09-23 (×2): 160 ug via INTRAVENOUS
  Administered 2020-09-23 (×2): 80 ug via INTRAVENOUS
  Administered 2020-09-23: 160 ug via INTRAVENOUS
  Administered 2020-09-23: 80 ug via INTRAVENOUS
  Administered 2020-09-23: 160 ug via INTRAVENOUS

## 2020-09-23 MED ORDER — FENTANYL CITRATE (PF) 250 MCG/5ML IJ SOLN
INTRAMUSCULAR | Status: DC | PRN
  Administered 2020-09-23: 150 ug via INTRAVENOUS
  Administered 2020-09-23: 100 ug via INTRAVENOUS
  Administered 2020-09-23: 250 ug via INTRAVENOUS
  Administered 2020-09-23: 150 ug via INTRAVENOUS
  Administered 2020-09-23: 100 ug via INTRAVENOUS
  Administered 2020-09-23: 750 ug via INTRAVENOUS

## 2020-09-23 MED ORDER — BUPIVACAINE LIPOSOME 1.3 % IJ SUSP
INTRAMUSCULAR | Status: DC | PRN
  Administered 2020-09-23: 50 mL

## 2020-09-23 MED ORDER — ASPIRIN EC 325 MG PO TBEC
325.0000 mg | DELAYED_RELEASE_TABLET | Freq: Every day | ORAL | Status: DC
  Administered 2020-09-24: 325 mg via ORAL
  Filled 2020-09-23: qty 1

## 2020-09-23 MED ORDER — METOPROLOL TARTRATE 25 MG/10 ML ORAL SUSPENSION
12.5000 mg | Freq: Two times a day (BID) | ORAL | Status: DC
  Filled 2020-09-23: qty 5

## 2020-09-23 MED ORDER — LACTATED RINGERS IV SOLN: INTRAVENOUS | Status: DC

## 2020-09-23 MED ORDER — MIDAZOLAM HCL (PF) 10 MG/2ML IJ SOLN
INTRAMUSCULAR | Status: AC
  Filled 2020-09-23: qty 2

## 2020-09-23 MED ORDER — MAGNESIUM SULFATE 4 GM/100ML IV SOLN
4.0000 g | Freq: Once | INTRAVENOUS | Status: AC
  Administered 2020-09-23: 4 g via INTRAVENOUS

## 2020-09-23 MED ORDER — SODIUM CHLORIDE 0.45 % IV SOLN: INTRAVENOUS | Status: DC | PRN

## 2020-09-23 MED ORDER — PROTAMINE SULFATE 10 MG/ML IV SOLN
INTRAVENOUS | Status: AC
  Filled 2020-09-23: qty 5

## 2020-09-23 MED ORDER — METOPROLOL TARTRATE 12.5 MG HALF TABLET
12.5000 mg | ORAL_TABLET | Freq: Two times a day (BID) | ORAL | Status: DC
  Administered 2020-09-23 – 2020-09-27 (×6): 12.5 mg via ORAL
  Filled 2020-09-23 (×6): qty 1

## 2020-09-23 MED ORDER — ROCURONIUM BROMIDE 10 MG/ML (PF) SYRINGE
PREFILLED_SYRINGE | INTRAVENOUS | Status: DC | PRN
  Administered 2020-09-23: 70 mg via INTRAVENOUS
  Administered 2020-09-23: 30 mg via INTRAVENOUS
  Administered 2020-09-23 (×3): 50 mg via INTRAVENOUS

## 2020-09-23 MED ORDER — DEXTROSE 50 % IV SOLN: 0.0000 mL | INTRAVENOUS | Status: DC | PRN

## 2020-09-23 MED ORDER — PROPOFOL 10 MG/ML IV BOLUS
INTRAVENOUS | Status: DC | PRN
  Administered 2020-09-23 (×2): 50 mg via INTRAVENOUS

## 2020-09-23 MED ORDER — SODIUM CHLORIDE 0.9% FLUSH
3.0000 mL | Freq: Two times a day (BID) | INTRAVENOUS | Status: DC
  Administered 2020-09-24 – 2020-09-26 (×5): 3 mL via INTRAVENOUS

## 2020-09-23 MED ORDER — ACETAMINOPHEN 160 MG/5ML PO SOLN: 650.0000 mg | Freq: Once | ORAL | Status: AC

## 2020-09-23 MED ORDER — SODIUM CHLORIDE 0.9% FLUSH
10.0000 mL | Freq: Two times a day (BID) | INTRAVENOUS | Status: DC
  Administered 2020-09-23 – 2020-09-25 (×5): 10 mL

## 2020-09-23 MED ORDER — CHLORHEXIDINE GLUCONATE 0.12 % MT SOLN
15.0000 mL | Freq: Two times a day (BID) | OROMUCOSAL | Status: DC
  Administered 2020-09-24: 15 mL via OROMUCOSAL

## 2020-09-23 MED ORDER — BISACODYL 10 MG RE SUPP: 10.0000 mg | Freq: Every day | RECTAL | Status: DC

## 2020-09-23 MED ORDER — 0.9 % SODIUM CHLORIDE (POUR BTL) OPTIME
TOPICAL | Status: DC | PRN
  Administered 2020-09-23: 5000 mL

## 2020-09-23 MED ORDER — HEMOSTATIC AGENTS (NO CHARGE) OPTIME
TOPICAL | Status: DC | PRN
  Administered 2020-09-23: 1 via TOPICAL

## 2020-09-23 MED ORDER — CHLORHEXIDINE GLUCONATE CLOTH 2 % EX PADS
6.0000 | MEDICATED_PAD | Freq: Every day | CUTANEOUS | Status: DC
  Administered 2020-09-23 – 2020-09-27 (×5): 6 via TOPICAL

## 2020-09-23 MED ORDER — STERILE WATER FOR INJECTION IJ SOLN
INTRAMUSCULAR | Status: AC
  Filled 2020-09-23: qty 10

## 2020-09-23 MED ORDER — ONDANSETRON HCL 4 MG/2ML IJ SOLN: 4.0000 mg | Freq: Four times a day (QID) | INTRAMUSCULAR | Status: DC | PRN

## 2020-09-23 MED ORDER — ORAL CARE MOUTH RINSE
15.0000 mL | Freq: Two times a day (BID) | OROMUCOSAL | Status: DC
  Administered 2020-09-23: 15 mL via OROMUCOSAL

## 2020-09-23 MED ORDER — SODIUM CHLORIDE 0.9 % IV SOLN
1.5000 g | Freq: Two times a day (BID) | INTRAVENOUS | Status: AC
  Administered 2020-09-23 – 2020-09-25 (×4): 1.5 g via INTRAVENOUS
  Filled 2020-09-23 (×4): qty 1.5

## 2020-09-23 MED ORDER — POTASSIUM CHLORIDE 10 MEQ/50ML IV SOLN
10.0000 meq | INTRAVENOUS | Status: AC
  Administered 2020-09-23 (×3): 10 meq via INTRAVENOUS

## 2020-09-23 MED ORDER — MIDAZOLAM HCL 5 MG/5ML IJ SOLN
INTRAMUSCULAR | Status: DC | PRN
  Administered 2020-09-23: 3 mg via INTRAVENOUS
  Administered 2020-09-23: 2 mg via INTRAVENOUS
  Administered 2020-09-23: 3 mg via INTRAVENOUS
  Administered 2020-09-23: 2 mg via INTRAVENOUS

## 2020-09-23 MED ORDER — MUPIROCIN 2 % EX OINT
TOPICAL_OINTMENT | CUTANEOUS | Status: AC
  Filled 2020-09-23: qty 22

## 2020-09-23 MED ORDER — SODIUM CHLORIDE 0.9% FLUSH: 3.0000 mL | INTRAVENOUS | Status: DC | PRN

## 2020-09-23 MED ORDER — PROTAMINE SULFATE 10 MG/ML IV SOLN
INTRAVENOUS | Status: AC
  Filled 2020-09-23: qty 25

## 2020-09-23 MED ORDER — STERILE WATER FOR INJECTION IJ SOLN
INTRAMUSCULAR | Status: DC | PRN
  Administered 2020-09-23: 20 mL

## 2020-09-23 MED ORDER — STERILE WATER FOR INJECTION IV SOLN
INTRAVENOUS | Status: DC | PRN
  Administered 2020-09-23: 30 mL

## 2020-09-23 MED ORDER — METOPROLOL TARTRATE 5 MG/5ML IV SOLN: 2.5000 mg | INTRAVENOUS | Status: DC | PRN

## 2020-09-23 MED ORDER — EPHEDRINE 5 MG/ML INJ
INTRAVENOUS | Status: AC
  Filled 2020-09-23: qty 10

## 2020-09-23 MED ORDER — MORPHINE SULFATE (PF) 2 MG/ML IV SOLN
1.0000 mg | INTRAVENOUS | Status: DC | PRN
  Administered 2020-09-23 – 2020-09-24 (×3): 2 mg via INTRAVENOUS
  Filled 2020-09-23 (×3): qty 1

## 2020-09-23 MED ORDER — LACTATED RINGERS IV SOLN: 500.0000 mL | Freq: Once | INTRAVENOUS | Status: DC | PRN

## 2020-09-23 SURGICAL SUPPLY — 123 items
ADAPTER CARDIO PERF ANTE/RETRO (ADAPTER) ×4 IMPLANT
ADH SKN CLS APL DERMABOND .7 (GAUZE/BANDAGES/DRESSINGS) ×4
APL SKNCLS STERI-STRIP NONHPOA (GAUZE/BANDAGES/DRESSINGS) ×2
APL SRG 7X2 LUM MLBL SLNT (VASCULAR PRODUCTS) ×2
APPLICATOR TIP COSEAL (VASCULAR PRODUCTS) ×4 IMPLANT
APPLIER CLIP 9.375 SM OPEN (CLIP)
APR CLP SM 9.3 20 MLT OPN (CLIP)
BAG DECANTER FOR FLEXI CONT (MISCELLANEOUS) ×4 IMPLANT
BENZOIN TINCTURE PRP APPL 2/3 (GAUZE/BANDAGES/DRESSINGS) ×8 IMPLANT
BLADE CLIPPER SURG (BLADE) ×4 IMPLANT
BLADE STERNUM SYSTEM 6 (BLADE) ×4 IMPLANT
BLADE SURG 15 STRL LF DISP TIS (BLADE) ×3 IMPLANT
BLADE SURG 15 STRL SS (BLADE) ×4
BNDG ELASTIC 4X5.8 VLCR STR LF (GAUZE/BANDAGES/DRESSINGS) ×4 IMPLANT
BNDG ELASTIC 6X5.8 VLCR STR LF (GAUZE/BANDAGES/DRESSINGS) ×4 IMPLANT
BNDG GAUZE ELAST 4 BULKY (GAUZE/BANDAGES/DRESSINGS) ×4 IMPLANT
CANISTER SUCT 3000ML PPV (MISCELLANEOUS) ×4 IMPLANT
CANISTER WOUNDNEG PRESSURE 500 (CANNISTER) ×4 IMPLANT
CANNULA NON VENT 22FR 12 (CANNULA) ×4 IMPLANT
CATH CPB KIT HENDRICKSON (MISCELLANEOUS) ×4 IMPLANT
CATH ROBINSON RED A/P 18FR (CATHETERS) ×8 IMPLANT
CLIP APPLIE 9.375 SM OPEN (CLIP) IMPLANT
CLIP RETRACTION 3.0MM CORONARY (MISCELLANEOUS) IMPLANT
CLIP VESOCCLUDE MED 24/CT (CLIP) IMPLANT
CLIP VESOCCLUDE SM WIDE 24/CT (CLIP) ×12 IMPLANT
CONN ST 1/4X3/8  BEN (MISCELLANEOUS) ×8
CONN ST 1/4X3/8 BEN (MISCELLANEOUS) ×6 IMPLANT
COVER MAYO STAND STRL (DRAPES) ×4 IMPLANT
CUFF TOURN SGL QUICK 18X4 (TOURNIQUET CUFF) IMPLANT
CUFF TOURN SGL QUICK 24 (TOURNIQUET CUFF)
CUFF TRNQT CYL 24X4X16.5-23 (TOURNIQUET CUFF) IMPLANT
DERMABOND ADVANCED (GAUZE/BANDAGES/DRESSINGS) ×2
DERMABOND ADVANCED .7 DNX12 (GAUZE/BANDAGES/DRESSINGS) ×6 IMPLANT
DRAIN CHANNEL 28F RND 3/8 FF (WOUND CARE) ×12 IMPLANT
DRAPE CARDIOVASCULAR INCISE (DRAPES) ×4
DRAPE EXTREMITY T 121X128X90 (DISPOSABLE) ×4 IMPLANT
DRAPE HALF SHEET 40X57 (DRAPES) ×4 IMPLANT
DRAPE SLUSH/WARMER DISC (DRAPES) ×4 IMPLANT
DRAPE SRG 135X102X78XABS (DRAPES) ×3 IMPLANT
DRESSING PEEL AND PLAC PRVNA20 (GAUZE/BANDAGES/DRESSINGS) ×3 IMPLANT
DRESSING PEEL AND PLC PRVNA 13 (GAUZE/BANDAGES/DRESSINGS) ×3 IMPLANT
DRSG AQUACEL AG ADV 3.5X14 (GAUZE/BANDAGES/DRESSINGS) ×4 IMPLANT
DRSG PEEL AND PLACE PREVENA 13 (GAUZE/BANDAGES/DRESSINGS) ×4
DRSG PEEL AND PLACE PREVENA 20 (GAUZE/BANDAGES/DRESSINGS) ×4
ELECT CAUTERY BLADE 6.4 (BLADE) ×4 IMPLANT
ELECT REM PT RETURN 9FT ADLT (ELECTROSURGICAL) ×8
ELECTRODE REM PT RTRN 9FT ADLT (ELECTROSURGICAL) ×6 IMPLANT
FELT TEFLON 1X6 (MISCELLANEOUS) ×4 IMPLANT
GAUZE SPONGE 4X4 12PLY STRL (GAUZE/BANDAGES/DRESSINGS) ×4 IMPLANT
GAUZE SPONGE 4X4 12PLY STRL LF (GAUZE/BANDAGES/DRESSINGS) ×4 IMPLANT
GEL ULTRASOUND 20GR AQUASONIC (MISCELLANEOUS) ×4 IMPLANT
GLOVE BIO SURGEON STRL SZ 6.5 (GLOVE) ×8 IMPLANT
GLOVE BIOGEL PI IND STRL 6.5 (GLOVE) ×15 IMPLANT
GLOVE BIOGEL PI INDICATOR 6.5 (GLOVE) ×5
GLOVE NEODERM STRL 7.5  LF PF (GLOVE) ×12
GLOVE NEODERM STRL 7.5 LF PF (GLOVE) ×12 IMPLANT
GLOVE SURG NEODERM 7.5  LF PF (GLOVE) ×4
GOWN STRL REUS W/ TWL LRG LVL3 (GOWN DISPOSABLE) ×24 IMPLANT
GOWN STRL REUS W/TWL LRG LVL3 (GOWN DISPOSABLE) ×32
HEMOSTAT POWDER SURGIFOAM 1G (HEMOSTASIS) ×8 IMPLANT
INSERT FOGARTY XLG (MISCELLANEOUS) ×4 IMPLANT
INSERT SUTURE HOLDER (MISCELLANEOUS) ×4 IMPLANT
KIT APPLICATOR RATIO 11:1 (KITS) ×4 IMPLANT
KIT BASIN OR (CUSTOM PROCEDURE TRAY) ×4 IMPLANT
KIT SUCTION CATH 14FR (SUCTIONS) ×4 IMPLANT
KIT TURNOVER KIT B (KITS) ×4 IMPLANT
KIT VASOVIEW HEMOPRO 2 VH 4000 (KITS) ×4 IMPLANT
MARKER GRAFT CORONARY BYPASS (MISCELLANEOUS) ×12 IMPLANT
NEEDLE 18GX1X1/2 (RX/OR ONLY) (NEEDLE) ×4 IMPLANT
NS IRRIG 1000ML POUR BTL (IV SOLUTION) ×20 IMPLANT
PACK E OPEN HEART (SUTURE) ×4 IMPLANT
PACK OPEN HEART (CUSTOM PROCEDURE TRAY) ×4 IMPLANT
PACK PLATELET PROCEDURE 60 (MISCELLANEOUS) ×4 IMPLANT
PACK SPY-PHI (KITS) ×4 IMPLANT
PAD ARMBOARD 7.5X6 YLW CONV (MISCELLANEOUS) ×8 IMPLANT
PAD ELECT DEFIB RADIOL ZOLL (MISCELLANEOUS) ×4 IMPLANT
PENCIL BUTTON HOLSTER BLD 10FT (ELECTRODE) ×4 IMPLANT
POSITIONER HEAD DONUT 9IN (MISCELLANEOUS) ×4 IMPLANT
POWDER SURGICEL 3.0 GRAM (HEMOSTASIS) ×4 IMPLANT
PUNCH AORTIC ROTATE 4.0MM (MISCELLANEOUS) ×4 IMPLANT
SEALANT SURG COSEAL 4ML (VASCULAR PRODUCTS) IMPLANT
SEALANT SURG COSEAL 8ML (VASCULAR PRODUCTS) ×4 IMPLANT
SET CARDIOPLEGIA MPS 5001102 (MISCELLANEOUS) ×4 IMPLANT
SHEARS HARMONIC 9CM CVD (BLADE) ×4 IMPLANT
STAPLER VISISTAT 35W (STAPLE) ×8 IMPLANT
SUPPORT HEART JANKE-BARRON (MISCELLANEOUS) ×4 IMPLANT
SUT BONE WAX W31G (SUTURE) ×4 IMPLANT
SUT MNCRL AB 3-0 PS2 18 (SUTURE) ×8 IMPLANT
SUT PDS AB 1 CTX 36 (SUTURE) ×8 IMPLANT
SUT PROLENE 3 0 SH DA (SUTURE) ×4 IMPLANT
SUT PROLENE 5 0 C 1 36 (SUTURE) IMPLANT
SUT PROLENE 6 0 C 1 30 (SUTURE) ×12 IMPLANT
SUT PROLENE 7.0 RB 3 (SUTURE) ×8 IMPLANT
SUT PROLENE 8 0 BV175 6 (SUTURE) ×8 IMPLANT
SUT PROLENE BLUE 7 0 (SUTURE) ×4 IMPLANT
SUT SILK  1 MH (SUTURE) ×4
SUT SILK 1 MH (SUTURE) ×3 IMPLANT
SUT SILK 2 0 SH CR/8 (SUTURE) IMPLANT
SUT SILK 3 0 SH CR/8 (SUTURE) IMPLANT
SUT STEEL 6MS V (SUTURE) ×4 IMPLANT
SUT STEEL SZ 6 DBL 3X14 BALL (SUTURE) ×4 IMPLANT
SUT VIC AB 2-0 CT1 27 (SUTURE) ×4
SUT VIC AB 2-0 CT1 TAPERPNT 27 (SUTURE) ×3 IMPLANT
SUT VIC AB 2-0 CTX 27 (SUTURE) IMPLANT
SUT VIC AB 3-0 SH 27 (SUTURE)
SUT VIC AB 3-0 SH 27X BRD (SUTURE) IMPLANT
SUT VIC AB 3-0 X1 27 (SUTURE) IMPLANT
SYR 10ML LL (SYRINGE) IMPLANT
SYR 30ML LL (SYRINGE) ×4 IMPLANT
SYR 3ML LL SCALE MARK (SYRINGE) IMPLANT
SYR 50ML SLIP (SYRINGE) IMPLANT
SYR BULB IRRIG 60ML STRL (SYRINGE) ×8 IMPLANT
SYSTEM SAHARA CHEST DRAIN ATS (WOUND CARE) ×4 IMPLANT
TAPE CLOTH SURG 4X10 WHT LF (GAUZE/BANDAGES/DRESSINGS) ×4 IMPLANT
TAPE PAPER 2X10 WHT MICROPORE (GAUZE/BANDAGES/DRESSINGS) ×4 IMPLANT
TIP DUAL SPRAY TOPICAL (TIP) ×8 IMPLANT
TOWEL GREEN STERILE (TOWEL DISPOSABLE) ×4 IMPLANT
TOWEL GREEN STERILE FF (TOWEL DISPOSABLE) ×4 IMPLANT
TRAY FOLEY SLVR 16FR TEMP STAT (SET/KITS/TRAYS/PACK) ×4 IMPLANT
TUBING LAP HI FLOW INSUFFLATIO (TUBING) IMPLANT
UNDERPAD 30X36 HEAVY ABSORB (UNDERPADS AND DIAPERS) ×8 IMPLANT
WATER STERILE IRR 1000ML POUR (IV SOLUTION) ×8 IMPLANT
WATER STERILE IRR 1000ML UROMA (IV SOLUTION) IMPLANT

## 2020-09-23 NOTE — Anesthesia Postprocedure Evaluation (Signed)
Anesthesia Post Note  Patient: CLARK CUFF  Procedure(s) Performed: CORONARY ARTERY BYPASS GRAFTING (CABG), ON PUMP, TIMES THREE, USING BILATERAL INTERNAL MAMMARY ARTERIES AND LEFT RADIAL ARTERY (N/A Chest) RADIAL ARTERY HARVEST (Left Arm Lower) TRANSESOPHAGEAL ECHOCARDIOGRAM (TEE) (N/A ) INDOCYANINE GREEN FLUORESCENCE IMAGING (ICG) (N/A )     Patient location during evaluation: SICU Anesthesia Type: General Level of consciousness: sedated Pain management: pain level controlled Vital Signs Assessment: post-procedure vital signs reviewed and stable Respiratory status: patient remains intubated per anesthesia plan Cardiovascular status: stable Postop Assessment: no apparent nausea or vomiting Anesthetic complications: no   No complications documented.  Last Vitals:  Vitals:   09/23/20 1509 09/23/20 1600  BP:  (!) 133/96  Pulse:  95  Resp:  (!) 24  Temp:  (!) 35.7 C  SpO2: 95% 98%    Last Pain:  Vitals:   09/23/20 1600  TempSrc: Core  PainSc:                  Miya Luviano DAVID

## 2020-09-23 NOTE — Brief Op Note (Signed)
09/19/2020 - 09/23/2020  1:44 PM  PATIENT:  [name] Gray  64 y.o. male  PRE-OPERATIVE DIAGNOSIS:  CAD  POST-OPERATIVE DIAGNOSIS:  CAD  PROCEDURE:  Procedure(s): CORONARY ARTERY BYPASS GRAFTING (CABG), ON PUMP, TIMES THREE, USING BILATERAL INTERNAL MAMMARY ARTERIES AND LEFT RADIAL ARTERY (N/A) RADIAL ARTERY HARVEST (Left) TRANSESOPHAGEAL ECHOCARDIOGRAM (TEE) (N/A) INDOCYANINE GREEN FLUORESCENCE IMAGING (ICG) (N/A) LIM[place]-L[place] RIMA - PD LEFT RADIAL-DIAG RADIAL ARTERY HARVEST TIME- 50 MIN  SURGEON:  Surgeon(s) and Role:    * Linden Dolin, MD - Primary  PHYSICIAN ASSISTANT: WAYNE GOLD PA-C  ASSISTANTS:STAFF  ANESTHESIA:   general  EBL:  Per anes   BLOOD ADMINISTERED:none  DRAINS: 2 BLAKE DRAINS IN PLEURAL SPACE AND MEDIASINAL DRAINS   LOCAL MEDICATIONS USED:  OTHER: EXPAREL  SPECIMEN:  No Specimen  DISPOSITION OF SPECIMEN:  N/A  COUNTS:  YES  TOURNIQUET:  * No tourniquets in log *  DICTATION: .Dragon Dictation  PLAN OF CARE: Admit to inpatient   PATIENT DISPOSITION:  ICU - intubated and hemodynamically stable.   Delay start of Pharmacological VTE agent (>24hrs) due to surgical blood loss or risk of bleeding: yes  COMPLICATIONS: NO KNOWN

## 2020-09-23 NOTE — Anesthesia Procedure Notes (Signed)
Central Venous Catheter Insertion Performed by: Lillia Abed, MD, anesthesiologist Start/End11/12/2019 8:00 AM, 09/23/2020 8:10 AM Patient location: Pre-op. Preanesthetic checklist: patient identified, IV checked, risks and benefits discussed, surgical consent, monitors and equipment checked, pre-op evaluation, timeout performed and anesthesia consent Position: Trendelenburg Lidocaine 1% used for infiltration and patient sedated Hand hygiene performed  and maximum sterile barriers used  Catheter size: 8.5 Fr PA cath was placed.MAC introducer Swan type:thermodilution Procedure performed using ultrasound guided technique. Ultrasound Notes:anatomy identified, needle tip was noted to be adjacent to the nerve/plexus identified, no ultrasound evidence of intravascular and/or intraneural injection and image(s) printed for medical record Attempts: 1 Following insertion, line sutured and dressing applied. Post procedure assessment: blood return through all ports, free fluid flow and no air  Patient tolerated the procedure well with no immediate complications.

## 2020-09-23 NOTE — H&P (Signed)
History and Physical Interval Note:  09/23/2020 9:19 AM  [name] Gray  has presented today for surgery, with the diagnosis of CAD.  The various methods of treatment have been discussed with the patient and family. After consideration of risks, benefits and other options for treatment, the patient has consented to  Procedure(s) with comments: CORONARY ARTERY BYPASS GRAFTING (CABG) (N/A) - possible BIMA RADIAL ARTERY HARVEST (Left) TRANSESOPHAGEAL ECHOCARDIOGRAM (TEE) (N/A) INDOCYANINE GREEN FLUORESCENCE IMAGING (ICG) (N/A) as a surgical intervention.  The patient's history has been reviewed, patient examined, no change in status, stable for surgery.  I have reviewed the patient's chart and labs.  Questions were answered to the patient's satisfaction.     Linden Dolin

## 2020-09-23 NOTE — Progress Notes (Addendum)
Pt states that he has OSA and wears a CPAP at home. Called patients wife to ask if she would bring his machine tomorrow when she visits, no answer at this time.

## 2020-09-23 NOTE — Transfer of Care (Signed)
Immediate Anesthesia Transfer of Care Note  Patient: [name] Gray  Procedure(s) Performed: CORONARY ARTERY BYPASS GRAFTING (CABG), ON PUMP, TIMES THREE, USING BILATERAL INTERNAL MAMMARY ARTERIES AND LEFT RADIAL ARTERY (N/A Chest) RADIAL ARTERY HARVEST (Left Arm Lower) TRANSESOPHAGEAL ECHOCARDIOGRAM (TEE) (N/A ) INDOCYANINE GREEN FLUORESCENCE IMAGING (ICG) (N/A )  Patient Location: SICU  Anesthesia Type:General  Level of Consciousness: Patient remains intubated per anesthesia plan  Airway & Oxygen Therapy: Patient remains intubated per anesthesia plan and Patient placed on Ventilator (see vital sign flow sheet for setting)  Post-op Assessment: Report given to RN and Post -op Vital signs reviewed and stable  Post vital signs: Reviewed and stable  Last Vitals:  Vitals Value Taken Time  BP    Temp    Pulse 92 09/23/20 1506  Resp 12 09/23/20 1506  SpO2 95 % 09/23/20 1506  Vitals shown include unvalidated device data.  Last Pain:  Vitals:   09/23/20 0800  TempSrc: Oral  PainSc:          Complications: No complications documented.

## 2020-09-23 NOTE — Plan of Care (Signed)

## 2020-09-23 NOTE — Procedures (Signed)
Extubation Procedure Note  Patient Details:   Name: [name] Gray DOB: [birth year] MRN: [id number]   Airway Documentation:    Vent end date: 09/23/20 Vent end time: 1825   Evaluation  O2 sats: stable throughout Complications: No apparent complications Patient did tolerate procedure well. Bilateral Breath Sounds: Pleural rub   Patient extubated per SICU wean protocol & placed on 4L [place]. Patient met all weaning parameters prior to extubation with positive cuff leak. IS instructed 500x10.  [name] Gray, [name] Gray 09/23/2020, 6:30 PM

## 2020-09-23 NOTE — Op Note (Signed)
CARDIOTHORACIC SURGERY OPERATIVE NOTE  Date of Procedure: 09/23/2020  Preoperative Diagnosis: Severe 3-vessel Coronary Artery Disease  Postoperative Diagnosis: Same  Procedure:    Coronary Artery Bypass Grafting x 3   Left Internal Mammary Artery to Distal Left Anterior Descending Coronary Artery; pedicled RIMA graft to Posterior Descending Coronary Artery; left radial artery Graft to 1st Diagonal Branch Coronary Artery Open left radial artery harvesting Bilateral IMA harvesting Completion graft surveillance with indocyanine green fluorescence imaging (SPY) Application of prevena incisional management system  Surgeon: B. Lorayne Marek, MD  Assistant: Webb Laws, PA-C  Anesthesia: GET  Operative Findings:  preserved left ventricular systolic function  Good quality internal mammary artery conduits  Good quality left radial artery conduit  Good quality target vessels for grafting    BRIEF CLINICAL NOTE AND INDICATIONS FOR SURGERY  64 year old man had undergone previous PCI for diffuse multivessel coronary artery disease.  He presented in the past few days with crescendo angina.  Repeat work-up demonstrated in-stent restenosis.  He is taken the operating room for CABG having undergone a thorough preoperative evaluation which determined him to be a good operative candidate.   DETAILS OF THE OPERATIVE PROCEDURE  Preparation:  The patient is brought to the operating room on the above mentioned date and central monitoring was established by the anesthesia team including placement of Swan-Ganz catheter and radial arterial line. The patient is placed in the supine position on the operating table.  Intravenous antibiotics are administered. General endotracheal anesthesia is induced uneventfully. A Foley catheter is placed.  Baseline transesophageal echocardiogram was performed.  Findings were notable for preserved left ventricular function and no significant valvular disease  The  patient's chest, abdomen, left upper extremity, both groins, and both lower extremities are prepared and draped in a sterile manner. A time out procedure is performed.   Surgical Approach and Conduit Harvest:  A median sternotomy incision was performed and the left internal mammary artery is dissected from the chest wall and prepared for bypass grafting. The left internal mammary artery is notably good quality conduit. Simultaneously, the left radial artery is harvested in an open technique. After removal of the left radial artery, the left forearm incision is closed in layers and the arm is tucked at the left side.  Next, attention is turned to the right hemithorax with the right internal mammary artery and its pedicle are mobilized in a standard fashion.  Following systemic heparinization, the internal mammary artery conduits were transected distally noted to have excellent flow.  Both grafts were treated with a solution of papaverine.   Extracorporeal Cardiopulmonary Bypass and Myocardial Protection:  The pericardium is opened. The ascending aorta is nondiseased in appearance. The ascending aorta and the right atrium are cannulated for cardiopulmonary bypass.  Adequate heparinization is verified.     The entire pre-bypass portion of the operation was notable for stable hemodynamics.  Cardiopulmonary bypass was begun and the surface of the heart is inspected. Distal target vessels are selected for coronary artery bypass grafting. A cardioplegia cannula is placed in the ascending aorta.   The patient is allowed to cool passively to 34C systemic temperature.  The aortic cross clamp is applied and cold blood cardioplegia is delivered initially in an antegrade fashion through the aortic root.   Iced saline slush is applied for topical hypothermia.  The initial cardioplegic arrest is rapid with early diastolic arrest.  Repeat doses of cardioplegia are administered intermittently throughout the entire  cross clamp portion of the operation through the  aortic root and through subsequently placed vein grafts in order to maintain completely flat electrocardiogram.   Coronary Artery Bypass Grafting:   The  posterior descending branch of the right coronary artery was grafted using the pedicled right internal mammary artery graft in an end-to-side fashion.  At the site of distal anastomosis the target vessel was fair quality and measured approximately 1.5 mm in diameter. Anastomotic patency and runoff was confirmed with indocyanine green fluorescence imaging (SPY).  The 1st diagonal branch of the left anterior descending coronary artery was grafted using the left radial artery graft in an end-to-side fashion.  At the site of distal anastomosis the target vessel was good quality and measured approximately 1.5 mm in diameter.  The distal left anterior coronary artery was grafted with the left internal mammary artery in an end-to-side fashion.  At the site of distal anastomosis the target vessel was good quality and measured approximately 1.5 mm in diameter. Anastomotic patency and runoff was confirmed with indocyanine green fluorescence imaging (SPY).  The proximal radial graft anastomosis was placed directly to the ascending aorta prior to removal of the aortic cross clamp.  De-airing procedures were performed and the aortic cross-clamp was removed   Procedure Completion:  All proximal and distal coronary anastomoses were inspected for hemostasis and appropriate graft orientation. Epicardial pacing wires are fixed to the right ventricular outflow tract. The patient is rewarmed to 37C temperature. The patient is weaned and disconnected from cardiopulmonary bypass.  The patient's rhythm at separation from bypass was normal sinus.  The patient was weaned from cardiopulmonary bypass  without any inotropic support.   Followup transesophageal echocardiogram performed after separation from bypass revealed no  changes from the preoperative exam.  The aortic and venous cannula were removed uneventfully. Protamine was administered to reverse the anticoagulation. The mediastinum and pleural space were inspected for hemostasis and irrigated with saline solution. The mediastinum and both pleural spaces were drained using fluted chest tubes placed through separate stab incisions inferiorly.  The soft tissues anterior to the aorta were reapproximated loosely. The sternum is closed with double strength sternal wire. The soft tissues anterior to the sternum were closed in multiple layers and the skin is closed with skin staples.  A Prevena incisional management system was placed on the closed incision as a sterile dressing  The post-bypass portion of the operation was notable for stable rhythm and hemodynamics.  No blood products were administered during the operation.   Disposition:  The patient tolerated the procedure well and is transported to the surgical intensive care in stable condition. There are no intraoperative complications. All sponge instrument and needle counts are verified correct at completion of the operation.    Brantley Fling, MD 09/23/2020 9:28 PM

## 2020-09-23 NOTE — Progress Notes (Signed)
°  Echocardiogram Echocardiogram Transesophageal has been performed.  [name] Gray 09/23/2020, 10:18 AM

## 2020-09-23 NOTE — Anesthesia Procedure Notes (Signed)
Arterial Line Insertion Start/End11/12/2019 8:40 AM, 09/23/2020 8:50 AM Performed by: Arta Bruce, MD, Macie Burows, CRNA, CRNA  Patient location: Pre-op. Preanesthetic checklist: patient identified, IV checked, site marked, risks and benefits discussed, surgical consent, monitors and equipment checked, pre-op evaluation, timeout performed and anesthesia consent Lidocaine 1% used for infiltration Right, radial was placed Catheter size: 20 G Hand hygiene performed  and maximum sterile barriers used   Attempts: 1 Procedure performed without using ultrasound guided technique. Following insertion, dressing applied and Biopatch. Post procedure assessment: normal and unchanged  Patient tolerated the procedure well with no immediate complications.

## 2020-09-23 NOTE — OR Nursing (Signed)
Blood from patient was processed and centrifuged by perfusion to obtain Platelet Rich Plasma (PRP) and Platelet Poor Plasma (PPP). Each product is drawn up in a different syringe and transferred to the sterile field.    First a mixture of 11mL of 10% Calcium Chloride Injection 0.5g/75mL will be combined with all the Thrombin 5000units powder only.   The scrub person mixed 72mL PRP with 60mL of the Thrombin/ Calcium Chloride mixture in one applicator syringe.    The scrub person mixed 64mL of PPP 48mL of the Thrombin/ Calcium Chloride mix on the second applicator syringe. These mixtures are applied to sternal edges.

## 2020-09-23 NOTE — Progress Notes (Signed)
Thomes Cake, CRNA notified of time that heparin was stopped. She is at bedside with patient.

## 2020-09-23 NOTE — OR Nursing (Signed)
Forty-five minute call to 2 heart Charge Nurse at 1354. Spoke to [place].

## 2020-09-23 NOTE — Progress Notes (Signed)
Patient's Wedding ring removed and given to patients wife [name]. [name] Gray, [name] Gray An rN

## 2020-09-23 NOTE — Anesthesia Preprocedure Evaluation (Signed)
Anesthesia Evaluation  Patient identified by MRN, date of birth, ID band Patient awake    Reviewed: Allergy & Precautions, NPO status , Patient's Chart, lab work & pertinent test results  Airway Mallampati: II  TM Distance: >3 FB Neck ROM: Full    Dental   Pulmonary former smoker,    Pulmonary exam normal        Cardiovascular hypertension, Pt. on medications + angina + CAD  Normal cardiovascular exam     Neuro/Psych    GI/Hepatic   Endo/Other  diabetes, Type 2  Renal/GU      Musculoskeletal   Abdominal   Peds  Hematology   Anesthesia Other Findings   Reproductive/Obstetrics                             Anesthesia Physical Anesthesia Plan  ASA: III  Anesthesia Plan: General   Post-op Pain Management:    Induction: Intravenous  PONV Risk Score and Plan: 2 and Ondansetron and Midazolam  Airway Management Planned: Oral ETT  Additional Equipment: Arterial line, PA Cath and Ultrasound Guidance Line Placement  Intra-op Plan:   Post-operative Plan: Post-operative intubation/ventilation  Informed Consent: I have reviewed the patients History and Physical, chart, labs and discussed the procedure including the risks, benefits and alternatives for the proposed anesthesia with the patient or authorized representative who has indicated his/her understanding and acceptance.       Plan Discussed with: CRNA and Surgeon  Anesthesia Plan Comments:         Anesthesia Quick Evaluation

## 2020-09-23 NOTE — Anesthesia Procedure Notes (Signed)
Procedure Name: Intubation Date/Time: 09/23/2020 10:35 AM Performed by: Teressa Lower., CRNA Pre-anesthesia Checklist: Patient identified, Emergency Drugs available, Suction available and Patient being monitored Patient Re-evaluated:Patient Re-evaluated prior to induction Oxygen Delivery Method: Circle system utilized Preoxygenation: Pre-oxygenation with 100% oxygen Induction Type: IV induction Ventilation: Mask ventilation without difficulty and Oral airway inserted - appropriate to patient size Laryngoscope Size: Mac and 4 Grade View: Grade I Tube type: Oral Tube size: 8.0 mm Number of attempts: 1 Airway Equipment and Method: Stylet and Oral airway Placement Confirmation: ETT inserted through vocal cords under direct vision,  positive ETCO2 and breath sounds checked- equal and bilateral Secured at: 23 cm Tube secured with: Tape Dental Injury: Teeth and Oropharynx as per pre-operative assessment

## 2020-09-24 ENCOUNTER — Encounter (HOSPITAL_COMMUNITY): Payer: Self-pay | Admitting: Cardiothoracic Surgery

## 2020-09-24 ENCOUNTER — Inpatient Hospital Stay (HOSPITAL_COMMUNITY): Payer: BC Managed Care – PPO

## 2020-09-24 DIAGNOSIS — I200 Unstable angina: Secondary | ICD-10-CM | POA: Diagnosis not present

## 2020-09-24 LAB — BASIC METABOLIC PANEL WITH GFR
Anion gap: 9 (ref 5–15)
BUN: 9 mg/dL (ref 8–23)
CO2: 21 mmol/L — ABNORMAL LOW (ref 22–32)
Calcium: 7.6 mg/dL — ABNORMAL LOW (ref 8.9–10.3)
Chloride: 107 mmol/L (ref 98–111)
Creatinine, Ser: 0.82 mg/dL (ref 0.61–1.24)
GFR, Estimated: >60 mL/min
Glucose, Bld: 153 mg/dL — ABNORMAL HIGH (ref 70–99)
Potassium: 3.7 mmol/L (ref 3.5–5.1)
Sodium: 137 mmol/L (ref 135–145)

## 2020-09-24 LAB — GLUCOSE, CAPILLARY
Glucose-Capillary: 100 mg/dL — ABNORMAL HIGH (ref 70–99)
Glucose-Capillary: 120 mg/dL — ABNORMAL HIGH (ref 70–99)
Glucose-Capillary: 125 mg/dL — ABNORMAL HIGH (ref 70–99)
Glucose-Capillary: 130 mg/dL — ABNORMAL HIGH (ref 70–99)
Glucose-Capillary: 131 mg/dL — ABNORMAL HIGH (ref 70–99)
Glucose-Capillary: 132 mg/dL — ABNORMAL HIGH (ref 70–99)
Glucose-Capillary: 132 mg/dL — ABNORMAL HIGH (ref 70–99)
Glucose-Capillary: 139 mg/dL — ABNORMAL HIGH (ref 70–99)
Glucose-Capillary: 140 mg/dL — ABNORMAL HIGH (ref 70–99)
Glucose-Capillary: 142 mg/dL — ABNORMAL HIGH (ref 70–99)
Glucose-Capillary: 144 mg/dL — ABNORMAL HIGH (ref 70–99)
Glucose-Capillary: 145 mg/dL — ABNORMAL HIGH (ref 70–99)
Glucose-Capillary: 153 mg/dL — ABNORMAL HIGH (ref 70–99)
Glucose-Capillary: 153 mg/dL — ABNORMAL HIGH (ref 70–99)
Glucose-Capillary: 155 mg/dL — ABNORMAL HIGH (ref 70–99)
Glucose-Capillary: 159 mg/dL — ABNORMAL HIGH (ref 70–99)
Glucose-Capillary: 159 mg/dL — ABNORMAL HIGH (ref 70–99)
Glucose-Capillary: 160 mg/dL — ABNORMAL HIGH (ref 70–99)
Glucose-Capillary: 172 mg/dL — ABNORMAL HIGH (ref 70–99)
Glucose-Capillary: 177 mg/dL — ABNORMAL HIGH (ref 70–99)
Glucose-Capillary: 200 mg/dL — ABNORMAL HIGH (ref 70–99)

## 2020-09-24 LAB — BASIC METABOLIC PANEL
Anion gap: 10 (ref 5–15)
BUN: 11 mg/dL (ref 8–23)
CO2: 21 mmol/L — ABNORMAL LOW (ref 22–32)
Calcium: 7.9 mg/dL — ABNORMAL LOW (ref 8.9–10.3)
Chloride: 103 mmol/L (ref 98–111)
Creatinine, Ser: 0.71 mg/dL (ref 0.61–1.24)
GFR, Estimated: >60 mL/min (ref >60)
Glucose, Bld: 139 mg/dL — ABNORMAL HIGH (ref 70–99)
Potassium: 3.8 mmol/L (ref 3.5–5.1)
Sodium: 134 mmol/L — ABNORMAL LOW (ref 135–145)

## 2020-09-24 LAB — CBC
HCT: 35.5 % — ABNORMAL LOW (ref 39.0–52.0)
HCT: 36.7 % — ABNORMAL LOW (ref 39.0–52.0)
Hemoglobin: 11.9 g/dL — ABNORMAL LOW (ref 13.0–17.0)
Hemoglobin: 12.2 g/dL — ABNORMAL LOW (ref 13.0–17.0)
MCH: 32.8 pg (ref 26.0–34.0)
MCH: 32.4 pg (ref 26.0–34.0)
MCHC: 33.5 g/dL (ref 30.0–36.0)
MCHC: 33.2 g/dL (ref 30.0–36.0)
MCV: 97.8 fL (ref 80.0–100.0)
MCV: 97.3 fL (ref 80.0–100.0)
Platelets: 127 K/uL — ABNORMAL LOW (ref 150–400)
Platelets: UNDETERMINED 10*3/uL (ref 150–400)
RBC: 3.63 MIL/uL — ABNORMAL LOW (ref 4.22–5.81)
RBC: 3.77 MIL/uL — ABNORMAL LOW (ref 4.22–5.81)
RDW: 12.9 % (ref 11.5–15.5)
RDW: 13.2 % (ref 11.5–15.5)
WBC: 15.4 K/uL — ABNORMAL HIGH (ref 4.0–10.5)
WBC: 17.2 10*3/uL — ABNORMAL HIGH (ref 4.0–10.5)
nRBC: 0 % (ref 0.0–0.2)
nRBC: 0 % (ref 0.0–0.2)

## 2020-09-24 LAB — MAGNESIUM
Magnesium: 2.1 mg/dL (ref 1.7–2.4)
Magnesium: 2.3 mg/dL (ref 1.7–2.4)

## 2020-09-24 MED ORDER — POTASSIUM CHLORIDE CRYS ER 20 MEQ PO TBCR
20.0000 meq | EXTENDED_RELEASE_TABLET | ORAL | Status: AC
  Administered 2020-09-24 (×3): 20 meq via ORAL
  Filled 2020-09-24 (×3): qty 1

## 2020-09-24 MED ORDER — COLCHICINE 0.3 MG HALF TABLET
0.3000 mg | ORAL_TABLET | Freq: Two times a day (BID) | ORAL | Status: DC
  Administered 2020-09-24 – 2020-09-28 (×9): 0.3 mg via ORAL
  Filled 2020-09-24 (×12): qty 1

## 2020-09-24 MED ORDER — THIAMINE HCL 100 MG/ML IJ SOLN
Freq: Once | INTRAVENOUS | Status: AC
  Filled 2020-09-24: qty 1000

## 2020-09-24 MED ORDER — ISOSORBIDE DINITRATE 10 MG PO TABS
10.0000 mg | ORAL_TABLET | Freq: Three times a day (TID) | ORAL | Status: DC
  Administered 2020-09-24 – 2020-09-28 (×12): 10 mg via ORAL
  Filled 2020-09-24 (×12): qty 1

## 2020-09-24 MED ORDER — ORAL CARE MOUTH RINSE
15.0000 mL | Freq: Two times a day (BID) | OROMUCOSAL | Status: DC
  Administered 2020-09-25 – 2020-09-27 (×4): 15 mL via OROMUCOSAL

## 2020-09-24 MED FILL — Heparin Sodium (Porcine) Inj 1000 Unit/ML: INTRAMUSCULAR | Qty: 30 | Status: AC

## 2020-09-24 MED FILL — Magnesium Sulfate Inj 50%: INTRAMUSCULAR | Qty: 10 | Status: AC

## 2020-09-24 MED FILL — Potassium Chloride Inj 2 mEq/ML: INTRAVENOUS | Qty: 40 | Status: AC

## 2020-09-24 NOTE — Progress Notes (Signed)
Patient attempting to remove mask. Request to remove it. Placed on venturi mask. No distress noted at this time.

## 2020-09-24 NOTE — Plan of Care (Signed)

## 2020-09-24 NOTE — Progress Notes (Signed)
Cardiology Note: Patient looks great, postoperative day #1 from multivessel CABG.  Heart rhythm is stable in normal sinus.  Patient sitting up in bed.  He is alert, oriented, in no distress.  Breathing comfortably with normal conversation.  Currently on insulin, nitroglycerin, and phenylephrine drips.  Review of telemetry demonstrates normal sinus rhythm with no significant arrhythmia.  Management per cardiac surgical team.  Will follow.  Tonny Bollman 09/24/2020 8:50 AM

## 2020-09-24 NOTE — Addendum Note (Signed)
Addendum  created 09/24/20 0715 by Rachel Moulds, CRNA   Order list changed

## 2020-09-24 NOTE — Progress Notes (Signed)
1 Day Post-Op Procedure(s) (LRB): CORONARY ARTERY BYPASS GRAFTING (CABG), ON PUMP, TIMES THREE, USING BILATERAL INTERNAL MAMMARY ARTERIES AND LEFT RADIAL ARTERY (N/A) RADIAL ARTERY HARVEST (Left) TRANSESOPHAGEAL ECHOCARDIOGRAM (TEE) (N/A) INDOCYANINE GREEN FLUORESCENCE IMAGING (ICG) (N/A) Subjective: No complaints  Objective: Vital signs in last 24 hours: Temp:  [96.3 F (35.7 C)-99.5 F (37.5 C)] 98.4 F (36.9 C) (11/03 0645) Pulse Rate:  [72-100] 80 (11/03 0645) Cardiac Rhythm: Normal sinus rhythm (11/03 0400) Resp:  [14-32] 23 (11/03 0645) BP: (77-139)/(57-96) 92/59 (11/03 0600) SpO2:  [82 %-100 %] 94 % (11/03 0645) Arterial Line BP: (88-283)/(47-107) 112/56 (11/03 0645) FiO2 (%):  [40 %-60 %] 50 % (11/03 0448) Weight:  [104.4 kg-109.4 kg] 109.4 kg (11/03 0530)  Hemodynamic parameters for last 24 hours: PAP: (18-55)/(9-31) 39/19 CO:  [4.7 L/min-6.7 L/min] 6.7 L/min CI:  [2.1 L/min/m2-3.1 L/min/m2] 3.1 L/min/m2  Intake/Output from previous day: 11/02 0701 - 11/03 0700 In: 6117.4 [I.V.:4123; KVQQV:956; IV Piggyback:1309.4] Out: 4613 [Urine:2875; Emesis/NG output:100; Blood:1028; Chest Tube:610] Intake/Output this shift: No intake/output data recorded.  General appearance: alert and cooperative Neurologic: intact Heart: regular rate and rhythm, S1, S2 normal, no murmur, click, rub or gallop Lungs: clear to auscultation bilaterally Abdomen: soft, non-tender; bowel sounds normal; no masses,  no organomegaly Extremities: extremities normal, atraumatic, no cyanosis or edema Wound: dressed, dry  Lab Results: Recent Labs    09/23/20 2105 09/23/20 2105 09/23/20 2107 09/24/20 0416  WBC 17.3*  --   --  15.4*  HGB 12.9*   < > 12.6* 11.9*  HCT 38.1*   < > 37.0* 35.5*  PLT 131*  --   --  127*   < > = values in this interval not displayed.   BMET:  Recent Labs    09/23/20 2105 09/23/20 2105 09/23/20 2107 09/24/20 0416  NA 136   < > 141 137  K 3.8   < > 4.1 3.7  CL  109  --   --  107  CO2 18*  --   --  21*  GLUCOSE 154*  --   --  153*  BUN 8  --   --  9  CREATININE 0.79  --   --  0.82  CALCIUM 7.6*  --   --  7.6*   < > = values in this interval not displayed.    PT/INR:  Recent Labs    09/23/20 1522  LABPROT 16.3*  INR 1.4*   ABG    Component Value Date/Time   PHART 7.381 09/23/2020 2107   HCO3 19.7 (L) 09/23/2020 2107   TCO2 21 (L) 09/23/2020 2107   ACIDBASEDEF 4.0 (H) 09/23/2020 2107   O2SAT 88.0 09/23/2020 2107   CBG (last 3)  Recent Labs    09/24/20 0312 09/24/20 0415 09/24/20 0520  GLUCAP 132* 139* 140*    Assessment/Plan: S/P Procedure(s) (LRB): CORONARY ARTERY BYPASS GRAFTING (CABG), ON PUMP, TIMES THREE, USING BILATERAL INTERNAL MAMMARY ARTERIES AND LEFT RADIAL ARTERY (N/A) RADIAL ARTERY HARVEST (Left) TRANSESOPHAGEAL ECHOCARDIOGRAM (TEE) (N/A) INDOCYANINE GREEN FLUORESCENCE IMAGING (ICG) (N/A) Mobilize leave on insulin drip today    LOS: 5 days    Linden Dolin 09/24/2020

## 2020-09-24 NOTE — Plan of Care (Signed)
  Problem: Education: Goal: Knowledge of General Education information will improve Description: Including pain rating scale, medication(s)/side effects and non-pharmacologic comfort measures Outcome: Progressing   Problem: Health Behavior/Discharge Planning: Goal: Ability to manage health-related needs will improve Outcome: Progressing   Problem: Clinical Measurements: Goal: Ability to maintain clinical measurements within normal limits will improve Outcome: Progressing Goal: Will remain free from infection Outcome: Progressing Goal: Diagnostic test results will improve Outcome: Progressing Goal: Respiratory complications will improve Outcome: Progressing Goal: Cardiovascular complication will be avoided Outcome: Progressing   Problem: Activity: Goal: Risk for activity intolerance will decrease Outcome: Progressing   Problem: Nutrition: Goal: Adequate nutrition will be maintained Outcome: Progressing   Problem: Coping: Goal: Level of anxiety will decrease Outcome: Progressing   Problem: Elimination: Goal: Will not experience complications related to bowel motility Outcome: Progressing Goal: Will not experience complications related to urinary retention Outcome: Progressing   Problem: Pain Managment: Goal: General experience of comfort will improve Outcome: Progressing   Problem: Safety: Goal: Ability to remain free from injury will improve Outcome: Progressing   Problem: Skin Integrity: Goal: Risk for impaired skin integrity will decrease Outcome: Progressing   Problem: Education: Goal: Will demonstrate proper wound care and an understanding of methods to prevent future damage Outcome: Progressing Goal: Knowledge of disease or condition will improve Outcome: Progressing Goal: Knowledge of the prescribed therapeutic regimen will improve Outcome: Progressing   Problem: Activity: Goal: Risk for activity intolerance will decrease Outcome: Progressing    Problem: Cardiac: Goal: Will achieve and/or maintain hemodynamic stability Outcome: Progressing   Problem: Clinical Measurements: Goal: Postoperative complications will be avoided or minimized Outcome: Progressing   Problem: Respiratory: Goal: Respiratory status will improve Outcome: Progressing   Problem: Skin Integrity: Goal: Wound healing without signs and symptoms of infection Outcome: Progressing Goal: Risk for impaired skin integrity will decrease Outcome: Progressing   Problem: Urinary Elimination: Goal: Ability to achieve and maintain adequate renal perfusion and functioning will improve Outcome: Progressing   

## 2020-09-24 NOTE — Progress Notes (Signed)
      [address]       [place] [address]             [phone]      POD # 1 CABG Sleeping BP (!) 87/73   Pulse 84   Temp 98.6 F (37 C) (Oral)   Resp (!) 26   Ht 5\' 10"  (1.778 m)   Wt 109.4 kg   SpO2 91%   BMI 34.61 kg/m   Neo and NTG off HFNC 12 L/min Hct= 37, K= 3.8, creatinine 0.71 Continue pulmonary hygiene  C. Viviann Spare, MD [facility] [phone]

## 2020-09-25 ENCOUNTER — Inpatient Hospital Stay (HOSPITAL_COMMUNITY): Payer: BC Managed Care – PPO

## 2020-09-25 DIAGNOSIS — Z951 Presence of aortocoronary bypass graft: Secondary | ICD-10-CM

## 2020-09-25 LAB — BASIC METABOLIC PANEL
Anion gap: 8 (ref 5–15)
BUN: 11 mg/dL (ref 8–23)
CO2: 21 mmol/L — ABNORMAL LOW (ref 22–32)
Calcium: 8 mg/dL — ABNORMAL LOW (ref 8.9–10.3)
Chloride: 107 mmol/L (ref 98–111)
Creatinine, Ser: 0.76 mg/dL (ref 0.61–1.24)
GFR, Estimated: >60 mL/min (ref >60)
Glucose, Bld: 133 mg/dL — ABNORMAL HIGH (ref 70–99)
Potassium: 4 mmol/L (ref 3.5–5.1)
Sodium: 136 mmol/L (ref 135–145)

## 2020-09-25 LAB — CBC
HCT: 34.9 % — ABNORMAL LOW (ref 39.0–52.0)
Hemoglobin: 11.6 g/dL — ABNORMAL LOW (ref 13.0–17.0)
MCH: 33 pg (ref 26.0–34.0)
MCHC: 33.2 g/dL (ref 30.0–36.0)
MCV: 99.4 fL (ref 80.0–100.0)
Platelets: 101 10*3/uL — ABNORMAL LOW (ref 150–400)
RBC: 3.51 MIL/uL — ABNORMAL LOW (ref 4.22–5.81)
RDW: 13.3 % (ref 11.5–15.5)
WBC: 15.4 10*3/uL — ABNORMAL HIGH (ref 4.0–10.5)
nRBC: 0 % (ref 0.0–0.2)

## 2020-09-25 LAB — GLUCOSE, CAPILLARY
Glucose-Capillary: 117 mg/dL — ABNORMAL HIGH (ref 70–99)
Glucose-Capillary: 130 mg/dL — ABNORMAL HIGH (ref 70–99)
Glucose-Capillary: 133 mg/dL — ABNORMAL HIGH (ref 70–99)
Glucose-Capillary: 133 mg/dL — ABNORMAL HIGH (ref 70–99)
Glucose-Capillary: 135 mg/dL — ABNORMAL HIGH (ref 70–99)
Glucose-Capillary: 137 mg/dL — ABNORMAL HIGH (ref 70–99)
Glucose-Capillary: 142 mg/dL — ABNORMAL HIGH (ref 70–99)
Glucose-Capillary: 151 mg/dL — ABNORMAL HIGH (ref 70–99)
Glucose-Capillary: 155 mg/dL — ABNORMAL HIGH (ref 70–99)
Glucose-Capillary: 179 mg/dL — ABNORMAL HIGH (ref 70–99)
Glucose-Capillary: 198 mg/dL — ABNORMAL HIGH (ref 70–99)
Glucose-Capillary: 200 mg/dL — ABNORMAL HIGH (ref 70–99)

## 2020-09-25 LAB — ECHO INTRAOPERATIVE TEE
Height: 70 in
Weight: 3682.56 oz

## 2020-09-25 MED ORDER — INSULIN ASPART 100 UNIT/ML ~~LOC~~ SOLN
0.0000 [IU] | Freq: Three times a day (TID) | SUBCUTANEOUS | Status: DC
  Administered 2020-09-25 (×2): 2 [IU] via SUBCUTANEOUS
  Administered 2020-09-26: 8 [IU] via SUBCUTANEOUS
  Administered 2020-09-26: 3 [IU] via SUBCUTANEOUS
  Administered 2020-09-26: 5 [IU] via SUBCUTANEOUS
  Administered 2020-09-27 (×3): 3 [IU] via SUBCUTANEOUS
  Administered 2020-09-28: 2 [IU] via SUBCUTANEOUS

## 2020-09-25 MED ORDER — KETOROLAC TROMETHAMINE 15 MG/ML IJ SOLN
7.5000 mg | Freq: Four times a day (QID) | INTRAMUSCULAR | Status: AC
  Administered 2020-09-25 – 2020-09-26 (×4): 7.5 mg via INTRAVENOUS
  Filled 2020-09-25 (×4): qty 1

## 2020-09-25 MED ORDER — ASPIRIN EC 81 MG PO TBEC
81.0000 mg | DELAYED_RELEASE_TABLET | Freq: Every day | ORAL | Status: DC
  Administered 2020-09-25 – 2020-09-28 (×4): 81 mg via ORAL
  Filled 2020-09-25 (×4): qty 1

## 2020-09-25 MED ORDER — FUROSEMIDE 10 MG/ML IJ SOLN
40.0000 mg | Freq: Two times a day (BID) | INTRAMUSCULAR | Status: DC
  Administered 2020-09-25: 40 mg via INTRAVENOUS
  Filled 2020-09-25: qty 4

## 2020-09-25 MED ORDER — AMIODARONE HCL IN DEXTROSE 360-4.14 MG/200ML-% IV SOLN: 60.0000 mg/h | INTRAVENOUS | Status: DC

## 2020-09-25 MED ORDER — FUROSEMIDE 10 MG/ML IJ SOLN
60.0000 mg | Freq: Two times a day (BID) | INTRAMUSCULAR | Status: DC
  Administered 2020-09-25 – 2020-09-27 (×5): 60 mg via INTRAVENOUS
  Filled 2020-09-25 (×6): qty 6

## 2020-09-25 MED ORDER — AMIODARONE LOAD VIA INFUSION
150.0000 mg | Freq: Once | INTRAVENOUS | Status: DC
  Filled 2020-09-25: qty 83.34

## 2020-09-25 MED ORDER — CLOPIDOGREL BISULFATE 75 MG PO TABS
75.0000 mg | ORAL_TABLET | Freq: Every day | ORAL | Status: DC
  Administered 2020-09-25 – 2020-09-28 (×4): 75 mg via ORAL
  Filled 2020-09-25 (×4): qty 1

## 2020-09-25 MED ORDER — INSULIN DETEMIR 100 UNIT/ML ~~LOC~~ SOLN
12.0000 [IU] | Freq: Two times a day (BID) | SUBCUTANEOUS | Status: DC
  Administered 2020-09-25 – 2020-09-28 (×7): 12 [IU] via SUBCUTANEOUS
  Filled 2020-09-25 (×9): qty 0.12

## 2020-09-25 MED ORDER — AMIODARONE HCL IN DEXTROSE 360-4.14 MG/200ML-% IV SOLN
30.0000 mg/h | INTRAVENOUS | Status: DC
  Administered 2020-09-26 – 2020-09-27 (×2): 30 mg/h via INTRAVENOUS
  Filled 2020-09-25 (×4): qty 200

## 2020-09-25 MED FILL — Calcium Chloride Inj 10%: INTRAVENOUS | Qty: 10 | Status: AC

## 2020-09-25 MED FILL — Sodium Bicarbonate IV Soln 8.4%: INTRAVENOUS | Qty: 50 | Status: AC

## 2020-09-25 MED FILL — Electrolyte-R (PH 7.4) Solution: INTRAVENOUS | Qty: 5000 | Status: AC

## 2020-09-25 MED FILL — Mannitol IV Soln 20%: INTRAVENOUS | Qty: 500 | Status: AC

## 2020-09-25 MED FILL — Thrombin For Soln 5000 Unit: CUTANEOUS | Qty: 5000 | Status: AC

## 2020-09-25 MED FILL — Sodium Chloride IV Soln 0.9%: INTRAVENOUS | Qty: 3000 | Status: AC

## 2020-09-25 NOTE — Progress Notes (Signed)
Patient ID: [name] Gray, male   DOB: 08-Sep-1956, 64 y.o.   MRN: [id number] [facility] Evening Rounds:  Hemodynamically stable  Just went into atrial fib with rate 90's to 110. Will start amiodarone.  Urine output 30/hr. Lasix increased this pm.  Chest tube output low.

## 2020-09-25 NOTE — Progress Notes (Signed)
Atkins MD notified of urine output 300 mL for shift after 40 mg lasix.

## 2020-09-25 NOTE — Progress Notes (Signed)
2 Days Post-Op Procedure(s) (LRB): CORONARY ARTERY BYPASS GRAFTING (CABG), ON PUMP, TIMES THREE, USING BILATERAL INTERNAL MAMMARY ARTERIES AND LEFT RADIAL ARTERY (N/A) RADIAL ARTERY HARVEST (Left) TRANSESOPHAGEAL ECHOCARDIOGRAM (TEE) (N/A) INDOCYANINE GREEN FLUORESCENCE IMAGING (ICG) (N/A) Subjective: No complaints  Objective: Vital signs in last 24 hours: Temp:  [97.8 F (36.6 C)-98.6 F (37 C)] 97.9 F (36.6 C) (11/04 1133) Pulse Rate:  [73-96] 81 (11/04 1200) Cardiac Rhythm: Normal sinus rhythm (11/04 1200) Resp:  [13-34] 23 (11/04 1200) BP: (87-128)/(52-73) 94/60 (11/04 1200) SpO2:  [90 %-96 %] 92 % (11/04 1200) Arterial Line BP: (89-148)/(49-71) 105/53 (11/04 1200) Weight:  [110.5 kg] 110.5 kg (11/04 0500)  Hemodynamic parameters for last 24 hours:    Intake/Output from previous day: 11/03 0701 - 11/04 0700 In: 1138.6 [I.V.:938.6; IV Piggyback:200] Out: 975 [Urine:585; Chest Tube:390] Intake/Output this shift: No intake/output data recorded.  General appearance: alert and cooperative Neurologic: intact Heart: regular rate and rhythm, S1, S2 normal, no murmur, click, rub or gallop Lungs: clear to auscultation bilaterally Abdomen: soft, non-tender; bowel sounds normal; no masses,  no organomegaly Extremities: extremities normal, atraumatic, no cyanosis or edema Wound: dressed  Lab Results: Recent Labs    09/24/20 1600 09/25/20 0453  WBC 17.2* 15.4*  HGB 12.2* 11.6*  HCT 36.7* 34.9*  PLT PLATELET CLUMPS NOTED ON SMEAR, UNABLE TO ESTIMATE 101*   BMET:  Recent Labs    09/24/20 1600 09/25/20 0453  NA 134* 136  K 3.8 4.0  CL 103 107  CO2 21* 21*  GLUCOSE 139* 133*  BUN 11 11  CREATININE 0.71 0.76  CALCIUM 7.9* 8.0*    PT/INR:  Recent Labs    09/23/20 1522  LABPROT 16.3*  INR 1.4*   ABG    Component Value Date/Time   PHART 7.381 09/23/2020 2107   HCO3 19.7 (L) 09/23/2020 2107   TCO2 21 (L) 09/23/2020 2107   ACIDBASEDEF 4.0 (H) 09/23/2020 2107    O2SAT 88.0 09/23/2020 2107   CBG (last 3)  Recent Labs    09/25/20 1025 09/25/20 1124 09/25/20 1232  GLUCAP 179* 142* 151*    Assessment/Plan: S/P Procedure(s) (LRB): CORONARY ARTERY BYPASS GRAFTING (CABG), ON PUMP, TIMES THREE, USING BILATERAL INTERNAL MAMMARY ARTERIES AND LEFT RADIAL ARTERY (N/A) RADIAL ARTERY HARVEST (Left) TRANSESOPHAGEAL ECHOCARDIOGRAM (TEE) (N/A) INDOCYANINE GREEN FLUORESCENCE IMAGING (ICG) (N/A) Mobilize Diuresis consider chest tube removal later today   LOS: 6 days    [name] Gray 09/25/2020

## 2020-09-25 NOTE — Discharge Summary (Signed)
Physician Discharge Summary  Patient ID: [name] Gray MRN: [id number] DOB/AGE: 62-Oct-1957 62 y.o.  Admit date: 09/19/2020 Discharge date: 09/28/2020  Admission Diagnoses: Unstable angina  Discharge Diagnoses:  Principal Problem:   Unstable angina ([place]) Active Problems:   DM (diabetes mellitus) type II uncontrolled, periph vascular disorder ([place])   Hyperlipidemia LDL goal <70   OBESITY, MORBID   Essential hypertension, benign   S/P CABG x 3  Patient Active Problem List   Diagnosis Date Noted  . S/P CABG x 3 09/23/2020  . Unstable angina ([place]) 04/03/2020  . DM (diabetes mellitus) type II uncontrolled, periph vascular disorder ([place]) 01/22/2010  . Hyperlipidemia LDL goal <70 01/22/2010  . OBESITY, MORBID 01/22/2010  . Essential hypertension, benign 01/22/2010  . CORONARY ATHEROSCLEROSIS NATIVE CORONARY ARTERY 01/22/2010  . PERIPHERAL VASCULAR DISEASE 01/22/2010   History of Present Illness:  At time of admission:  [name] Gray has history of poorly controlled DM on insulin, HTN, HLD, PAD and extensive coronary history. He is s/p stenting of the distal RCA in 2003 with a 3.5 x 28 mm Zeta stent. In 2011 he had stenting of the mid RCA proximal to the prior stent with a Promus stent. He returned in 2012 with recurrent stenosis and had repeat stenting of the mid vessel with a Resolute stent. In May of 2021 he had extensive coronary work with DES of the proximal RCA with a 3.5 x 28 mm Synergy stent and had Cutting balloon angioplasty of the mid and distal RCA. Returned the following day for staged PCI of the proximal and mid LAD with atherectomy and placement of 3.0 x 38 and 3.5 x 16 mm Synergy stents. Since then he has done well. Reports he quit smoking but is vaping. Working 14-16 hours a day in [employer]. Over the past 2 weeks he notes progressive symptoms of chest pain and indigestion. Symptoms are worse after eating or with any activity. Relieved with sl Ntg. Associated with SOB. This  am pain worsened and he took 3 sl Ntg with relief but pain returned. He states the pain is the same as before his stents.   His diabetes has been poorly controlled. He cannot remember his last A1c but it has been as high as 12. He hasn't eaten since last night.     The patient was admitted unstable angina.  He was noted to have slight ST depression on ECG.  He was found to have high risk for restenosis given extensive coronary interventions in the past with the last one in May 2021.  He was admitted for further evaluation and treatment including cardiology consultation.    Discharged Condition: good  Hospital Course: Patient was admitted and placed on a heparin drip.  Beta-blocker, statin and DAPT with aspirin and Plavix.  He was kept n.p.o. with plans to proceed with cardiac catheterization.  This was performed on 09/19/2020 he was found to have severe coronary artery disease as described in the report below.  Cardiothoracic surgical consultation was obtained and he was felt to be a candidate to proceed with CABG.  He is felt to require a Plavix washout.  He remained medically stable.  On 09/23/2020 he was taken the operating room where he underwent the below described procedure.  He tolerated it well was taken to the surgical intensive care unit in stable condition.  Postoperative hospital course: Patient has done well.  He was extubated without difficulty using standard postsurgical protocols.  He has remained hemodynamically stable.  He did  have some volume overload and was put on a course of diuretics.  All routine lines, monitors and drainage devices have been discontinued in standard fashion.  He does have a mild expected acute blood loss anemia which is stabilized.  Renal function has remained within normal limits.  Blood sugars have been somewhat difficult to control and he is noted to be poorly controlled historically.  Most recent hemoglobin A1c on 09/21/2020 is 9.7.  He did develop  postoperative atrial fibrillation and was placed on amiodarone.  Addendum:  The patient converted to NSR.  He was transitioned to an oral regimen of Amiodarone.  His pacing wires were removed without difficulty.  He developed complaints of burning in his left radial artery site.  This can occur due to nerve injury during the radial artery harvest.  This should improve with time.   He was mildly hypertensive and his home Lisinopril was restarted.  His Pravena wound vac was removed and there was no evidence of wound infection present.  He is ambulating in the hallway independently.  He is medically stable for discharge home today.  Treatments: surgery:  CARDIOTHORACIC SURGERY OPERATIVE NOTE  Date of Procedure:    09/23/2020  Preoperative Diagnosis:      Severe 3-vessel Coronary Artery Disease  Postoperative Diagnosis:    Same  Procedure:        Coronary Artery Bypass Grafting x 3              Left Internal Mammary Artery to Distal Left Anterior Descending Coronary Artery; pedicled RIMA graft to Posterior Descending Coronary Artery; left radial artery Graft to 1st Diagonal Branch Coronary Artery Open left radial artery harvesting Bilateral IMA harvesting Completion graft surveillance with indocyanine green fluorescence imaging (SPY) Application of prevena incisional management system  Surgeon:        B. Murvin Natal, MD  Assistant:       Evonnie Pat, PA-C  Anesthesia:    GET  Operative Findings: ? preserved left ventricular systolic function ? Good quality internal mammary artery conduits ? Good quality left radial artery conduit ? Good quality target vessels for grafting    Discharge Exam: Blood pressure (!) 142/86, pulse 76, temperature 98 F (36.7 C), temperature source Oral, resp. rate 18, height $RemoveBe'5\' 10"'HvMLdLfRt$  (1.778 m), weight 107.3 kg, SpO2 93 %.  General appearance: alert, cooperative and no distress Heart: regular rate and rhythm Lungs: clear to auscultation  bilaterally Abdomen: soft, non-tender; bowel sounds normal; no masses,  no organomegaly Extremities: edema trace Wound: radial site wtih bruising, parashtesias, pravena on sternotomy   Discharge disposition: 01-Home or Self Care   Discharge Instructions    Amb Referral to Cardiac Rehabilitation   Complete by: As directed    Diagnosis: CABG   CABG X ___: 3   After initial evaluation and assessments completed: Virtual Based Care may be provided alone or in conjunction with Phase 2 Cardiac Rehab based on patient barriers.: Yes      INTRAVASCULAR PRESSURE WIRE/FFR STUDY  LEFT HEART CATH AND CORONARY ANGIOGRAPHY  Conclusion    Previously placed Prox LAD drug eluting stent is widely patent.  Balloon angioplasty was performed.  Prox RCA to Mid RCA lesion is 25% stenosed.  Dist RCA lesion is 10% stenosed.  Non-stenotic Prox RCA lesion was previously treated.  Mid RCA lesion is 99% stenosed.  Mid LAD lesion is 70% stenosed with 0% stenosed side branch in 2nd Diag.   1. The LAD is a large caliber vessel that  courses to the apex. The proximal and mid vessel has a stented segment. There is a severe restenosis in the mid LAD stented segment in the stent that was placed in may 2021. (DFR=0.85).  2. The Circumflex is small with diffuse plaque 3. The RCA is a large dominant artery with stents from the ostium down through the distal vessel. There is severe restenosis in the mid stented segment. This area has 2 layers of stent and was treated in May 2021 with cutting balloon angioplasty.   Recommendations: He has progression of disease in the stented segments of the LAD and RCA. The LAD stent was placed in may 2021. The mid RCA stented segment has 2 layers of stent and was treated with cutting balloon angioplasty in May 2021. The LAD restenosis is significant by pressure wire analysis. I have reviewed his case with Dr. Martinique and we both agree that his best option will be CABG.  He will be  admitted to telemetry. He is pain free. Will arrange an echo. Will call CT surgery consult for CABG. Will stop Plavix. Continue ASA. Begin IV heparin 8 hours post sheath pull.    Recommendations  Antiplatelet/Anticoag He has progression of disease in the stented segments of the LAD and RCA. The LAD stent was placed in may 2021. The mid RCA stented segment has 2 layers of stent and was treated with cutting balloon angioplasty in May 2021. The LAD restenosis is significant by pressure wire analysis. I have reviewed his case with Dr. Martinique and we both agree that his best option will be CABG.  He will be admitted to telemetry. He is pain free. Will arrange an echo. Will call CT surgery consult for CABG. Will stop Plavix. Continue ASA. Begin IV heparin 8 hours post sheath pull.  Surgeon Notes    09/23/2020 9:37 PM Op Note signed by Wonda Olds, MD    09/23/2020 3:13 PM Brief Op Note signed by Wonda Olds, MD    09/23/2020 2:50 PM Operative Note - Scan signed by Default, Provider, MD  Indications  Unstable angina ([place]) [I20.0 (ICD-10-CM)]  Procedural Details  Technical Details Indication: 64 yo male with history of CAD with prior stenting of the RCA and LAD who is here today with unstable angina.   Procedure: The risks, benefits, complications, treatment options, and expected outcomes were discussed with the patient. The patient and/or family concurred with the proposed plan, giving informed consent. The patient was brought to the cath lab after IV hydration was given. The patient was sedated with Versed and Fentanyl. The right wrist was prepped and draped in a sterile fashion. 1% lidocaine was used for local anesthesia. Using the modified Seldinger access technique, a 5 French sheath was placed in the right radial artery. 3 mg Verapamil was given through the sheath. 5000 units IV heparin was given. Standard diagnostic catheters were used to perform selective coronary angiography. LV  pressures measured with the JR4 catheter.   Pressure Wire Analysis of the LAD: ACT was therapeutic. I engaged the left main with a XB LAD 3.5 guiding catheter. I then passed a Comet pressure wire down the LAD. The DFR was 0.85 suggesting the mid LAD stenosis was flow limiting.   The sheath was removed from the right radial artery and a Terumo hemostasis band was applied at the arteriotomy site on the right wrist.    Estimated blood loss <50 mL.   During this procedure medications were administered to achieve and maintain moderate  conscious sedation while the patient's heart rate, blood pressure, and oxygen saturation were continuously monitored and I was present face-to-face 100% of this time.  Medications (Filter: Administrations occurring from 1632 to 1737 on 09/19/20) (important) Continuous medications are totaled by the amount administered until 09/19/20 1737.  lidocaine (PF) (XYLOCAINE) 1 % injection (mL) Total volume:  2 mL  Date/Time  Rate/Dose/Volume Action  09/19/20 1651  2 mL Given    Heparin (Porcine) in NaCl 1000-0.9 UT/500ML-% SOLN (mL) Total volume:  1,000 mL  Date/Time  Rate/Dose/Volume Action  09/19/20 1651  500 mL Given  1651  500 mL Given    midazolam (VERSED) injection (mg) Total dose:  2 mg  Date/Time  Rate/Dose/Volume Action  09/19/20 1652  2 mg Given    fentaNYL (SUBLIMAZE) injection (mcg) Total dose:  25 mcg  Date/Time  Rate/Dose/Volume Action  09/19/20 1652  25 mcg Given    Radial Cocktail/Verapamil only (mL) Total volume:  10 mL  Date/Time  Rate/Dose/Volume Action  09/19/20 1653  10 mL Given    heparin sodium (porcine) injection (Units) Total dose:  11,000 Units  Date/Time  Rate/Dose/Volume Action  09/19/20 1654  5,000 Units Given  1704  6,000 Units Given    iohexol (OMNIPAQUE) 350 MG/ML injection (mL) Total volume:  75 mL  Date/Time  Rate/Dose/Volume Action  09/19/20 1728  75 mL Given    Sedation Time  Sedation Time Physician-1:  30 minutes 22 seconds  Contrast  Medication Name Total Dose  iohexol (OMNIPAQUE) 350 MG/ML injection 75 mL    Radiation/Fluoro  Fluoro time: 5.3 (min) DAP: 20.6 (Gycm2) Cumulative Air Kerma: 920 (mGy)  Complications  Complications documented before study signed (09/19/2020 5:55 PM)     Log Level Complications  None Documented by Burnell Blanks, MD 09/19/2020 5:51 PM  Date Found: 09/19/2020  Time Range: Intraprocedure    Coronary Findings  Diagnostic Dominance: Right Left Anterior Descending  Previously placed Prox LAD drug eluting stent is widely patent. The lesion is severely calcified.  Mid LAD lesion is 70% stenosed with 0% stenosed side branch in 2nd Diag. The lesion was previously treated using a drug eluting stent between 6-12 months ago.  Left Circumflex  The vessel exhibits minimal luminal irregularities.  Right Coronary Artery  Non-stenotic Prox RCA lesion was previously treated.  Prox RCA to Mid RCA lesion is 25% stenosed. The lesion was previously treated.  Mid RCA lesion is 99% stenosed. The lesion was previously treated.  Dist RCA lesion is 10% stenosed. The lesion was previously treated.  Intervention  No interventions have been documented. Coronary Diagrams  Diagnostic Dominance: Right  Intervention        Allergies as of 09/28/2020   No Known Allergies     Medication List    TAKE these medications   acetaminophen 500 MG tablet Commonly known as: TYLENOL Take 2 tablets (1,000 mg total) by mouth every 6 (six) hours as needed.   amiodarone 200 MG tablet Commonly known as: PACERONE Take 1 tablet (200 mg total) by mouth 2 (two) times daily.   aspirin EC 81 MG tablet Take 81 mg by mouth daily.   cholecalciferol 25 MCG (1000 UNIT) tablet Commonly known as: VITAMIN D3 Take 1,000 Units by mouth daily.   clopidogrel 75 MG tablet Commonly known as: PLAVIX Take 1 tablet (75 mg total) by mouth daily with breakfast.    colchicine 0.6 MG tablet Take 0.5 tablets (0.3 mg total) by mouth 2 (two) times daily.  furosemide 40 MG tablet Commonly known as: Lasix Take 1 tablet (40 mg total) by mouth daily.   Global Inject Ease Lancets 30G Misc USE FOUR TIMES DAILY   Insulin Pen Needle 32G X 4 MM Misc 1 box of 100 each   isosorbide mononitrate 30 MG 24 hr tablet Commonly known as: IMDUR TAKE 1 TABLET BY MOUTH EVERY DAY   Levemir FlexTouch 100 UNIT/ML FlexPen Generic drug: insulin detemir Inject 25 Units into the skin at bedtime.   lisinopril-hydrochlorothiazide 10-12.5 MG tablet Commonly known as: ZESTORETIC Take 1 tablet by mouth daily.   metFORMIN 500 MG 24 hr tablet Commonly known as: GLUCOPHAGE-XR Take 500 mg by mouth 2 (two) times daily.   metoprolol succinate 50 MG 24 hr tablet Commonly known as: Toprol XL Take 1 tablet (50 mg total) by mouth daily. Take with or immediately following a meal. What changed:   medication strength  how much to take  additional instructions   multivitamin with minerals Tabs tablet Take 1 tablet by mouth daily.   nitroGLYCERIN 0.4 MG SL tablet Commonly known as: NITROSTAT Place 0.4 mg under the tongue every 5 (five) minutes as needed for chest pain.   Ozempic (0.25 or 0.5 MG/DOSE) 2 MG/1.5ML Sopn Generic drug: Semaglutide(0.25 or 0.5MG /DOS) Inject 0.5 mg into the skin once a week.   potassium chloride SA 20 MEQ tablet Commonly known as: KLOR-CON Take 1 tablet (20 mEq total) by mouth daily.   rosuvastatin 20 MG tablet Commonly known as: CRESTOR Take 1 tablet (20 mg total) by mouth daily.   traMADol 50 MG tablet Commonly known as: ULTRAM Take 1 tablet (50 mg total) by mouth every 4 (four) hours as needed for moderate pain.   True Metrix Air Glucose Meter w/Device Kit USE AS DIRECTED            Durable Medical Equipment  (From admission, onward)         Start     Ordered   09/27/20 1829  For home use only DME 3 n 1  Once         09/27/20 1829   09/27/20 1828  For home use only DME Walker rolling  Once       Question Answer Comment  Walker: With 5 Inch Wheels   Patient needs a walker to treat with the following condition Hx of CABG      09/27/20 1827          Follow-up Information    Wonda Olds, MD Follow up.   Specialty: Cardiothoracic Surgery Why: Please see discharge paperwork for follow-up appointment with surgeon. Contact information: [address] [place] [address] [phone]        Sherren Mocha, MD Follow up.   Specialty: Cardiology Why: Please see discharge paperwork for follow-up appointment with cardiology. Contact information: [address] [place] [address] [phone]        Verta Ellen., NP Follow up on 10/10/2020.   Specialty: Cardiology Why: 9:30 am Contact information: [place] [address] [phone]             The patient has been discharged on:   1.Beta Blocker:  Yes [ X  ]                              No   [   ]  If No, reason:  2.Ace Inhibitor/ARB: Yes [ X  ]                                     No  [    ]                                     If No, reason:  3.Statin:   Yes [ X  ]                  No  [   ]                  If No, reason:  4.Ecasa:  Yes  [ X  ]                  No   [   ]                  If No, reason:   Signed:  Original Note by Jadene Pierini PA-C  Addendum by:  Ellwood Handler, PA-C 09/28/2020, 9:26 AM

## 2020-09-26 ENCOUNTER — Inpatient Hospital Stay (HOSPITAL_COMMUNITY): Payer: BC Managed Care – PPO

## 2020-09-26 ENCOUNTER — Encounter (HOSPITAL_COMMUNITY): Payer: Self-pay | Admitting: Cardiothoracic Surgery

## 2020-09-26 DIAGNOSIS — I4891 Unspecified atrial fibrillation: Secondary | ICD-10-CM

## 2020-09-26 LAB — CBC
HCT: 32.3 % — ABNORMAL LOW (ref 39.0–52.0)
HCT: 34.6 % — ABNORMAL LOW (ref 39.0–52.0)
Hemoglobin: 10.5 g/dL — ABNORMAL LOW (ref 13.0–17.0)
Hemoglobin: 11.1 g/dL — ABNORMAL LOW (ref 13.0–17.0)
MCH: 32.5 pg (ref 26.0–34.0)
MCH: 31.9 pg (ref 26.0–34.0)
MCHC: 32.5 g/dL (ref 30.0–36.0)
MCHC: 32.1 g/dL (ref 30.0–36.0)
MCV: 100 fL (ref 80.0–100.0)
MCV: 99.4 fL (ref 80.0–100.0)
Platelets: 102 10*3/uL — ABNORMAL LOW (ref 150–400)
Platelets: 112 10*3/uL — ABNORMAL LOW (ref 150–400)
RBC: 3.23 MIL/uL — ABNORMAL LOW (ref 4.22–5.81)
RBC: 3.48 MIL/uL — ABNORMAL LOW (ref 4.22–5.81)
RDW: 13.2 % (ref 11.5–15.5)
RDW: 13.2 % (ref 11.5–15.5)
WBC: 8.5 10*3/uL (ref 4.0–10.5)
WBC: 7.9 10*3/uL (ref 4.0–10.5)
nRBC: 0 % (ref 0.0–0.2)
nRBC: 0 % (ref 0.0–0.2)

## 2020-09-26 LAB — GLUCOSE, CAPILLARY
Glucose-Capillary: 197 mg/dL — ABNORMAL HIGH (ref 70–99)
Glucose-Capillary: 229 mg/dL — ABNORMAL HIGH (ref 70–99)
Glucose-Capillary: 264 mg/dL — ABNORMAL HIGH (ref 70–99)
Glucose-Capillary: 160 mg/dL — ABNORMAL HIGH (ref 70–99)

## 2020-09-26 LAB — BASIC METABOLIC PANEL
Anion gap: 9 (ref 5–15)
BUN: 19 mg/dL (ref 8–23)
CO2: 22 mmol/L (ref 22–32)
Calcium: 7.8 mg/dL — ABNORMAL LOW (ref 8.9–10.3)
Chloride: 104 mmol/L (ref 98–111)
Creatinine, Ser: 0.93 mg/dL (ref 0.61–1.24)
GFR, Estimated: >60 mL/min (ref >60)
Glucose, Bld: 208 mg/dL — ABNORMAL HIGH (ref 70–99)
Potassium: 3.8 mmol/L (ref 3.5–5.1)
Sodium: 135 mmol/L (ref 135–145)

## 2020-09-26 MED ORDER — POTASSIUM CHLORIDE CRYS ER 20 MEQ PO TBCR
20.0000 meq | EXTENDED_RELEASE_TABLET | Freq: Two times a day (BID) | ORAL | Status: DC
  Administered 2020-09-26 – 2020-09-27 (×3): 20 meq via ORAL
  Filled 2020-09-26 (×3): qty 1

## 2020-09-26 MED ORDER — MAGNESIUM SULFATE 2 GM/50ML IV SOLN
2.0000 g | Freq: Once | INTRAVENOUS | Status: AC
  Administered 2020-09-26: 2 g via INTRAVENOUS
  Filled 2020-09-26: qty 50

## 2020-09-26 NOTE — Progress Notes (Signed)
Cardiology follow up has been arranged. 

## 2020-09-26 NOTE — Progress Notes (Signed)
Progress Note  Patient Name: [name] Gray Date of Encounter: 09/26/2020  [facility] Cardiologist: Prentice Docker, MD (Inactive)   Subjective   Doing well with no chest pain or dyspnea. Sitting up in a chair at the bedside. Planning to transfer to 4 E. today.  Inpatient Medications    Scheduled Meds: . acetaminophen  1,000 mg Oral Q6H   Or  . acetaminophen (TYLENOL) oral liquid 160 mg/5 mL  1,000 mg Per Tube Q6H  . amiodarone  150 mg Intravenous Once  . aspirin EC  81 mg Oral Daily  . bisacodyl  10 mg Oral Daily   Or  . bisacodyl  10 mg Rectal Daily  . Chlorhexidine Gluconate Cloth  6 each Topical Daily  . clopidogrel  75 mg Oral Daily  . colchicine  0.3 mg Oral BID  . docusate sodium  200 mg Oral Daily  . furosemide  60 mg Intravenous BID  . insulin aspart  0-15 Units Subcutaneous TID WC  . insulin detemir  12 Units Subcutaneous BID  . isosorbide dinitrate  10 mg Oral TID  . mouth rinse  15 mL Mouth Rinse BID  . metoprolol tartrate  12.5 mg Oral BID   Or  . metoprolol tartrate  12.5 mg Per Tube BID  . mupirocin cream   Topical BID  . pantoprazole  40 mg Oral Daily  . potassium chloride  20 mEq Oral BID  . sodium chloride flush  10-40 mL Intracatheter Q12H  . sodium chloride flush  3 mL Intravenous Q12H   Continuous Infusions: . sodium chloride Stopped (09/24/20 0754)  . sodium chloride    . sodium chloride Stopped (09/25/20 0337)  . amiodarone 30 mg/hr (09/26/20 0838)  . lactated ringers    . lactated ringers Stopped (09/23/20 1540)  . lactated ringers Stopped (09/25/20 0501)  . magnesium sulfate bolus IVPB 2 g (09/26/20 0841)  . phenylephrine (NEO-SYNEPHRINE) Adult infusion Stopped (09/25/20 0330)   PRN Meds: sodium chloride, dextrose, lactated ringers, metoprolol tartrate, morphine injection, ondansetron (ZOFRAN) IV, oxyCODONE, sodium chloride flush, sodium chloride flush, traMADol   Vital Signs    Vitals:   09/26/20 0400 09/26/20 0500 09/26/20  0600 09/26/20 0700  BP: 123/69 96/71 119/82 (!) 159/84  Pulse: 88 79 (!) 55 90  Resp: (!) 21 16 (!) 31 20  Temp:    98.5 F (36.9 C)  TempSrc:    Oral  SpO2: 96% 96% 97% 95%  Weight:   111 kg   Height:        Intake/Output Summary (Last 24 hours) at 09/26/2020 0931 Last data filed at 09/26/2020 0700 Gross per 24 hour  Intake 616.61 ml  Output 1170 ml  Net -553.39 ml   Last 3 Weights 09/26/2020 09/25/2020 09/24/2020  Weight (lbs) 244 lb 11.4 oz 243 lb 9.7 oz 241 lb 2.9 oz  Weight (kg) 111 kg 110.5 kg 109.4 kg      Telemetry    Atrial fibrillation with controlled ventricular rate - Personally Reviewed  Physical Exam  Alert, oriented, obese male in no distress GEN: No acute distress.   Neck: No JVD Cardiac:  Irregularly irregular, no murmurs, rubs, or gallops. Distant heart sounds Respiratory: Clear to auscultation bilaterally. GI: Soft, nontender, non-distended  MS: No edema; No deformity. Neuro:  Nonfocal  Psych: Normal affect   Labs    High Sensitivity Troponin:   Recent Labs  Lab 09/19/20 0946 09/19/20 1113 09/19/20 1348  TROPONINIHS 23* 27* 29*  Chemistry Recent Labs  Lab 09/19/20 1113 09/20/20 0244 09/20/20 1736 09/23/20 0708 09/24/20 1600 09/25/20 0453 09/26/20 0326  NA  --    < >  --    < > 134* 136 135  K  --    < >  --    < > 3.8 4.0 3.8  CL  --    < >  --    < > 103 107 104  CO2  --    < >  --    < > 21* 21* 22  GLUCOSE  --    < >  --    < > 139* 133* 208*  BUN  --    < >  --    < > 11 11 19   CREATININE  --    < >  --    < > 0.71 0.76 0.93  CALCIUM  --    < >  --    < > 7.9* 8.0* 7.8*  PROT 7.5  --  7.2  --   --   --   --   ALBUMIN 4.0  --  3.8  --   --   --   --   AST 19  --  17  --   --   --   --   ALT 21  --  19  --   --   --   --   ALKPHOS 40  --  47  --   --   --   --   BILITOT 0.8  --  0.6  --   --   --   --   GFRNONAA  --    < >  --    < > >60 >60 >60  ANIONGAP  --    < >  --    < > 10 8 9    < > = values in this interval not  displayed.     Hematology Recent Labs  Lab 09/24/20 1600 09/25/20 0453 09/26/20 0326  WBC 17.2* 15.4* 8.5  RBC 3.77* 3.51* 3.23*  HGB 12.2* 11.6* 10.5*  HCT 36.7* 34.9* 32.3*  MCV 97.3 99.4 100.0  MCH 32.4 33.0 32.5  MCHC 33.2 33.2 32.5  RDW 13.2 13.3 13.2  PLT PLATELET CLUMPS NOTED ON SMEAR, UNABLE TO ESTIMATE 101* 102*    BNPNo results for input(s): BNP, PROBNP in the last 168 hours.   DDimer No results for input(s): DDIMER in the last 168 hours.   Radiology    DG Chest 1 View  Result Date: 09/25/2020 CLINICAL DATA:  Chest tube.  Open-heart surgery. EXAM: CHEST  1 VIEW COMPARISON:  09/24/2020. FINDINGS: Interval removal of Swan-Ganz catheter. Right IJ sheath in stable position. Bilateral chest tubes and mediastinal drainage catheter stable position. Prior median sternotomy. Stable cardiomegaly. Progressive bibasilar atelectasis/infiltrates. No prominent pleural effusion. No pneumothorax. IMPRESSION: 1. Interval removal of Swan-Ganz catheter. Right IJ sheath in stable position. Bilateral chest tubes and mediastinal drainage catheter stable position. No pneumothorax. 2. Progressive bibasilar atelectasis/infiltrates. Electronically Signed   By: 13/02/2020  Register   On: 09/25/2020 06:18   DG Chest Port 1 View  Result Date: 09/26/2020 CLINICAL DATA:  History of open heart surgery. EXAM: PORTABLE CHEST 1 VIEW COMPARISON:  Chest x-ray 09/25/2020. FINDINGS: Right IJ sheath interim removal of bilateral chest tubes. Right IJ sheath in stable position. Prior CABG. Stable cardiomegaly. No pulmonary venous congestion. Persistent but improved bibasilar atelectasis/infiltrates. Tiny right pleural effusion cannot be excluded.  No pneumothorax. IMPRESSION: 1. Interval removal of bilateral chest tubes. No pneumothorax. Right IJ sheath in stable position. 2. Prior CABG. Stable cardiomegaly. No pulmonary venous congestion. 3. Persistent but improved bibasilar atelectasis/infiltrates. Electronically Signed    By: Maisie Fus  Register   On: 09/26/2020 07:11     Patient Profile     63 y.o. male with hypertension, mixed hyperlipidemia, type 2 diabetes, and extensive coronary artery disease presents with unstable angina, found to have severe in-stent restenosis in the LAD and right coronary arteries, referred for CABG  Assessment & Plan    1. Unstable angina with severe multivessel coronary artery disease and in-stent restenosis 2. Mixed hyperlipidemia 3. Hypertension 4. Postoperative atrial fibrillation 5. Postoperative volume overload  Patient was started on IV amiodarone yesterday when he went into a rapid atrial fibrillation. He is now well rate controlled. Overall stable and progressing well postoperative day #3 from CABG. Hopefully he will convert back to sinus rhythm on IV amiodarone. If not, would be reasonable to add oral anticoagulation at discharge. If he requires oral anticoagulation, would stop clopidogrel. Will arrange outpatient follow-up in [place] in case he is discharged over the weekend.  For questions or updates, please contact [facility] Please consult [email or web address] for contact info under     Signed, Tonny Bollman, MD  09/26/2020, 9:31 AM

## 2020-09-26 NOTE — Progress Notes (Signed)
Patient placed on home CPAP unit.

## 2020-09-26 NOTE — Plan of Care (Signed)
Problem: Education: Goal: Knowledge of General Education information will improve Description: Including pain rating scale, medication(s)/side effects and non-pharmacologic comfort measures 09/26/2020 1325 by Alesia Morin, RN Outcome: Progressing 09/26/2020 1323 by Alesia Morin, RN Outcome: Progressing   Problem: Health Behavior/Discharge Planning: Goal: Ability to manage health-related needs will improve 09/26/2020 1325 by Alesia Morin, RN Outcome: Progressing 09/26/2020 1323 by Alesia Morin, RN Outcome: Progressing   Problem: Clinical Measurements: Goal: Ability to maintain clinical measurements within normal limits will improve 09/26/2020 1325 by Alesia Morin, RN Outcome: Progressing 09/26/2020 1323 by Alesia Morin, RN Outcome: Progressing Goal: Will remain free from infection 09/26/2020 1325 by Alesia Morin, RN Outcome: Progressing 09/26/2020 1323 by Alesia Morin, RN Outcome: Progressing Goal: Diagnostic test results will improve 09/26/2020 1325 by Alesia Morin, RN Outcome: Progressing 09/26/2020 1323 by Alesia Morin, RN Outcome: Progressing Goal: Respiratory complications will improve 09/26/2020 1325 by Alesia Morin, RN Outcome: Progressing 09/26/2020 1323 by Alesia Morin, RN Outcome: Progressing Goal: Cardiovascular complication will be avoided 09/26/2020 1325 by Alesia Morin, RN Outcome: Progressing 09/26/2020 1323 by Alesia Morin, RN Outcome: Progressing   Problem: Activity: Goal: Risk for activity intolerance will decrease 09/26/2020 1325 by Alesia Morin, RN Outcome: Progressing 09/26/2020 1323 by Alesia Morin, RN Outcome: Progressing   Problem: Nutrition: Goal: Adequate nutrition will be maintained 09/26/2020 1325 by Alesia Morin, RN Outcome: Progressing 09/26/2020 1323 by Alesia Morin, RN Outcome: Progressing   Problem: Coping: Goal: Level of anxiety will  decrease 09/26/2020 1325 by Alesia Morin, RN Outcome: Progressing 09/26/2020 1323 by Alesia Morin, RN Outcome: Progressing   Problem: Elimination: Goal: Will not experience complications related to bowel motility 09/26/2020 1325 by Alesia Morin, RN Outcome: Progressing 09/26/2020 1323 by Alesia Morin, RN Outcome: Progressing Goal: Will not experience complications related to urinary retention 09/26/2020 1325 by Alesia Morin, RN Outcome: Progressing 09/26/2020 1323 by Alesia Morin, RN Outcome: Progressing   Problem: Pain Managment: Goal: General experience of comfort will improve 09/26/2020 1325 by Alesia Morin, RN Outcome: Progressing 09/26/2020 1323 by Alesia Morin, RN Outcome: Progressing   Problem: Safety: Goal: Ability to remain free from injury will improve 09/26/2020 1325 by Alesia Morin, RN Outcome: Progressing 09/26/2020 1323 by Alesia Morin, RN Outcome: Progressing   Problem: Skin Integrity: Goal: Risk for impaired skin integrity will decrease 09/26/2020 1325 by Alesia Morin, RN Outcome: Progressing 09/26/2020 1323 by Alesia Morin, RN Outcome: Progressing   Problem: Education: Goal: Will demonstrate proper wound care and an understanding of methods to prevent future damage 09/26/2020 1325 by Alesia Morin, RN Outcome: Progressing 09/26/2020 1323 by Alesia Morin, RN Outcome: Progressing Goal: Knowledge of disease or condition will improve 09/26/2020 1325 by Alesia Morin, RN Outcome: Progressing 09/26/2020 1323 by Alesia Morin, RN Outcome: Progressing Goal: Knowledge of the prescribed therapeutic regimen will improve 09/26/2020 1325 by Alesia Morin, RN Outcome: Progressing 09/26/2020 1323 by Alesia Morin, RN Outcome: Progressing Goal: Individualized Educational Video(s) 09/26/2020 1325 by Alesia Morin, RN Outcome: Progressing 09/26/2020 1323 by Alesia Morin, RN Outcome: Progressing   Problem: Activity: Goal: Risk for activity intolerance will decrease 09/26/2020 1325 by Alesia Morin, RN Outcome: Progressing 09/26/2020 1323 by Alesia Morin, RN Outcome: Progressing   Problem: Cardiac: Goal: Will achieve and/or maintain hemodynamic stability 09/26/2020 1325 by Alesia Morin, RN Outcome: Progressing 09/26/2020 1323  by Alesia Morin, RN Outcome: Progressing   Problem: Clinical Measurements: Goal: Postoperative complications will be avoided or minimized 09/26/2020 1325 by Alesia Morin, RN Outcome: Progressing 09/26/2020 1323 by Alesia Morin, RN Outcome: Progressing   Problem: Respiratory: Goal: Respiratory status will improve 09/26/2020 1325 by Alesia Morin, RN Outcome: Progressing 09/26/2020 1323 by Alesia Morin, RN Outcome: Progressing   Problem: Skin Integrity: Goal: Wound healing without signs and symptoms of infection 09/26/2020 1325 by Alesia Morin, RN Outcome: Progressing 09/26/2020 1323 by Alesia Morin, RN Outcome: Progressing Goal: Risk for impaired skin integrity will decrease 09/26/2020 1325 by Alesia Morin, RN Outcome: Progressing 09/26/2020 1323 by Alesia Morin, RN Outcome: Progressing   Problem: Urinary Elimination: Goal: Ability to achieve and maintain adequate renal perfusion and functioning will improve 09/26/2020 1325 by Alesia Morin, RN Outcome: Progressing 09/26/2020 1323 by Alesia Morin, RN Outcome: Progressing   Problem: Education: Goal: Knowledge of General Education information will improve Description: Including pain rating scale, medication(s)/side effects and non-pharmacologic comfort measures Outcome: Progressing   Problem: Health Behavior/Discharge Planning: Goal: Ability to manage health-related needs will improve Outcome: Progressing   Problem: Clinical Measurements: Goal: Ability to maintain clinical measurements  within normal limits will improve Outcome: Progressing Goal: Will remain free from infection Outcome: Progressing Goal: Diagnostic test results will improve Outcome: Progressing Goal: Respiratory complications will improve Outcome: Progressing Goal: Cardiovascular complication will be avoided Outcome: Progressing   Problem: Activity: Goal: Risk for activity intolerance will decrease Outcome: Progressing   Problem: Nutrition: Goal: Adequate nutrition will be maintained Outcome: Progressing   Problem: Coping: Goal: Level of anxiety will decrease Outcome: Progressing   Problem: Elimination: Goal: Will not experience complications related to bowel motility Outcome: Progressing Goal: Will not experience complications related to urinary retention Outcome: Progressing   Problem: Pain Managment: Goal: General experience of comfort will improve Outcome: Progressing   Problem: Safety: Goal: Ability to remain free from injury will improve Outcome: Progressing   Problem: Skin Integrity: Goal: Risk for impaired skin integrity will decrease Outcome: Progressing   Problem: Education: Goal: Will demonstrate proper wound care and an understanding of methods to prevent future damage Outcome: Progressing Goal: Knowledge of disease or condition will improve Outcome: Progressing Goal: Knowledge of the prescribed therapeutic regimen will improve Outcome: Progressing Goal: Individualized Educational Video(s) Outcome: Progressing   Problem: Activity: Goal: Risk for activity intolerance will decrease Outcome: Progressing   Problem: Cardiac: Goal: Will achieve and/or maintain hemodynamic stability Outcome: Progressing   Problem: Clinical Measurements: Goal: Postoperative complications will be avoided or minimized Outcome: Progressing   Problem: Respiratory: Goal: Respiratory status will improve Outcome: Progressing   Problem: Skin Integrity: Goal: Wound healing without signs  and symptoms of infection Outcome: Progressing Goal: Risk for impaired skin integrity will decrease Outcome: Progressing   Problem: Urinary Elimination: Goal: Ability to achieve and maintain adequate renal perfusion and functioning will improve Outcome: Progressing

## 2020-09-26 NOTE — Plan of Care (Signed)

## 2020-09-26 NOTE — Progress Notes (Signed)
3 Days Post-Op Procedure(s) (LRB): CORONARY ARTERY BYPASS GRAFTING (CABG), ON PUMP, TIMES THREE, USING BILATERAL INTERNAL MAMMARY ARTERIES AND LEFT RADIAL ARTERY (N/A) RADIAL ARTERY HARVEST (Left) TRANSESOPHAGEAL ECHOCARDIOGRAM (TEE) (N/A) INDOCYANINE GREEN FLUORESCENCE IMAGING (ICG) (N/A) Subjective: No complaints  Objective: Vital signs in last 24 hours: Temp:  [97.8 F (36.6 C)-98.6 F (37 C)] 98.5 F (36.9 C) (11/05 0700) Pulse Rate:  [55-99] 90 (11/05 0700) Cardiac Rhythm: Atrial fibrillation (11/05 0400) Resp:  [13-32] 20 (11/05 0700) BP: (79-159)/(50-84) 159/84 (11/05 0700) SpO2:  [88 %-97 %] 95 % (11/05 0700) Arterial Line BP: (90-141)/(46-63) 91/48 (11/04 1305) Weight:  [765 kg] 111 kg (11/05 0600)  Hemodynamic parameters for last 24 hours:    Intake/Output from previous day: 11/04 0701 - 11/05 0700 In: 622.6 [P.O.:500; I.V.:25.8; IV Piggyback:96.8] Out: 1310 [Urine:1120; Chest Tube:190] Intake/Output this shift: No intake/output data recorded.  General appearance: alert and cooperative  Chest: CTA Heart: Irreg/irreg Ext: mild edema Neuro: intact  Lab Results: Recent Labs    09/25/20 0453 09/26/20 0326  WBC 15.4* 8.5  HGB 11.6* 10.5*  HCT 34.9* 32.3*  PLT 101* 102*   BMET:  Recent Labs    09/25/20 0453 09/26/20 0326  NA 136 135  K 4.0 3.8  CL 107 104  CO2 21* 22  GLUCOSE 133* 208*  BUN 11 19  CREATININE 0.76 0.93  CALCIUM 8.0* 7.8*    PT/INR:  Recent Labs    09/23/20 1522  LABPROT 16.3*  INR 1.4*   ABG    Component Value Date/Time   PHART 7.381 09/23/2020 2107   HCO3 19.7 (L) 09/23/2020 2107   TCO2 21 (L) 09/23/2020 2107   ACIDBASEDEF 4.0 (H) 09/23/2020 2107   O2SAT 88.0 09/23/2020 2107   CBG (last 3)  Recent Labs    09/25/20 1533 09/25/20 2202 09/26/20 0658  GLUCAP 137* 198* 197*    Assessment/Plan: S/P Procedure(s) (LRB): CORONARY ARTERY BYPASS GRAFTING (CABG), ON PUMP, TIMES THREE, USING BILATERAL INTERNAL MAMMARY  ARTERIES AND LEFT RADIAL ARTERY (N/A) RADIAL ARTERY HARVEST (Left) TRANSESOPHAGEAL ECHOCARDIOGRAM (TEE) (N/A) INDOCYANINE GREEN FLUORESCENCE IMAGING (ICG) (N/A) Mobilize Diuresis See progression orders  Continue amio gtt   LOS: 7 days    Linden Dolin 09/26/2020

## 2020-09-26 NOTE — Progress Notes (Signed)
CARDIAC REHAB PHASE I   PRE:  Rate/Rhythm: 75 Afib  BP:  Sitting: 112/80      SaO2: 97 2L  MODE:  Ambulation: 370 ft   POST:  Rate/Rhythm: 104 Afib  BP:  Sitting: 125/82    SaO2: 96 2L  After some convincing pt ambulated 332ft in hallway standby assist with EVA. Pt denies pain, SOB, or dizziness. Pt returned to bed. Call bell and bedside table within reach. Encouraged continued ambulation and IS use. Will continue to follow.  [id number] Reynold Bowen, RN BSN 09/26/2020 11:29 AM

## 2020-09-27 ENCOUNTER — Inpatient Hospital Stay (HOSPITAL_COMMUNITY): Payer: BC Managed Care – PPO

## 2020-09-27 LAB — GLUCOSE, CAPILLARY
Glucose-Capillary: 164 mg/dL — ABNORMAL HIGH (ref 70–99)
Glucose-Capillary: 195 mg/dL — ABNORMAL HIGH (ref 70–99)
Glucose-Capillary: 154 mg/dL — ABNORMAL HIGH (ref 70–99)
Glucose-Capillary: 169 mg/dL — ABNORMAL HIGH (ref 70–99)

## 2020-09-27 LAB — BASIC METABOLIC PANEL
Anion gap: 9 (ref 5–15)
BUN: 14 mg/dL (ref 8–23)
CO2: 21 mmol/L — ABNORMAL LOW (ref 22–32)
Calcium: 7.4 mg/dL — ABNORMAL LOW (ref 8.9–10.3)
Chloride: 105 mmol/L (ref 98–111)
Creatinine, Ser: 0.8 mg/dL (ref 0.61–1.24)
GFR, Estimated: >60 mL/min (ref >60)
Glucose, Bld: 171 mg/dL — ABNORMAL HIGH (ref 70–99)
Potassium: 3.5 mmol/L (ref 3.5–5.1)
Sodium: 135 mmol/L (ref 135–145)

## 2020-09-27 MED ORDER — AMIODARONE HCL 200 MG PO TABS
200.0000 mg | ORAL_TABLET | Freq: Two times a day (BID) | ORAL | Status: DC
  Administered 2020-09-27 – 2020-09-28 (×3): 200 mg via ORAL
  Filled 2020-09-27 (×3): qty 1

## 2020-09-27 MED ORDER — POTASSIUM CHLORIDE CRYS ER 20 MEQ PO TBCR
40.0000 meq | EXTENDED_RELEASE_TABLET | Freq: Two times a day (BID) | ORAL | Status: DC
  Administered 2020-09-27 – 2020-09-28 (×2): 40 meq via ORAL
  Filled 2020-09-27 (×2): qty 2

## 2020-09-27 MED ORDER — SALINE SPRAY 0.65 % NA SOLN
1.0000 | NASAL | Status: DC | PRN
  Filled 2020-09-27: qty 44

## 2020-09-27 MED ORDER — METOPROLOL TARTRATE 25 MG PO TABS
25.0000 mg | ORAL_TABLET | Freq: Two times a day (BID) | ORAL | Status: DC
  Administered 2020-09-27 – 2020-09-28 (×2): 25 mg via ORAL
  Filled 2020-09-27 (×2): qty 1

## 2020-09-27 MED ORDER — METOPROLOL TARTRATE 25 MG/10 ML ORAL SUSPENSION: 25.0000 mg | Freq: Two times a day (BID) | ORAL | Status: DC

## 2020-09-27 NOTE — Plan of Care (Signed)
Problem: Education: Goal: Knowledge of General Education information will improve Description: Including pain rating scale, medication(s)/side effects and non-pharmacologic comfort measures Outcome: Progressing   Problem: Health Behavior/Discharge Planning: Goal: Ability to manage health-related needs will improve Outcome: Progressing   Problem: Clinical Measurements: Goal: Ability to maintain clinical measurements within normal limits will improve Outcome: Progressing Goal: Will remain free from infection Outcome: Progressing Goal: Diagnostic test results will improve Outcome: Progressing Goal: Respiratory complications will improve Outcome: Progressing Goal: Cardiovascular complication will be avoided Outcome: Progressing   Problem: Activity: Goal: Risk for activity intolerance will decrease Outcome: Progressing   Problem: Nutrition: Goal: Adequate nutrition will be maintained Outcome: Progressing   Problem: Coping: Goal: Level of anxiety will decrease Outcome: Progressing   Problem: Elimination: Goal: Will not experience complications related to bowel motility Outcome: Progressing Goal: Will not experience complications related to urinary retention Outcome: Progressing   Problem: Pain Managment: Goal: General experience of comfort will improve Outcome: Progressing   Problem: Safety: Goal: Ability to remain free from injury will improve Outcome: Progressing   Problem: Skin Integrity: Goal: Risk for impaired skin integrity will decrease Outcome: Progressing   Problem: Education: Goal: Knowledge of General Education information will improve Description: Including pain rating scale, medication(s)/side effects and non-pharmacologic comfort measures Outcome: Progressing   Problem: Health Behavior/Discharge Planning: Goal: Ability to manage health-related needs will improve Outcome: Progressing   Problem: Clinical Measurements: Goal: Ability to maintain  clinical measurements within normal limits will improve Outcome: Progressing Goal: Will remain free from infection Outcome: Progressing Goal: Diagnostic test results will improve Outcome: Progressing Goal: Respiratory complications will improve Outcome: Progressing Goal: Cardiovascular complication will be avoided Outcome: Progressing   Problem: Activity: Goal: Risk for activity intolerance will decrease Outcome: Progressing   Problem: Nutrition: Goal: Adequate nutrition will be maintained Outcome: Progressing   Problem: Coping: Goal: Level of anxiety will decrease Outcome: Progressing   Problem: Pain Managment: Goal: General experience of comfort will improve Outcome: Progressing   Problem: Safety: Goal: Ability to remain free from injury will improve Outcome: Progressing   Problem: Skin Integrity: Goal: Risk for impaired skin integrity will decrease Outcome: Progressing   Problem: Education: Goal: Will demonstrate proper wound care and an understanding of methods to prevent future damage Outcome: Progressing Goal: Knowledge of disease or condition will improve Outcome: Progressing Goal: Knowledge of the prescribed therapeutic regimen will improve Outcome: Progressing Goal: Individualized Educational Video(s) Outcome: Progressing   Problem: Activity: Goal: Risk for activity intolerance will decrease Outcome: Progressing   Problem: Cardiac: Goal: Will achieve and/or maintain hemodynamic stability Outcome: Progressing   Problem: Clinical Measurements: Goal: Postoperative complications will be avoided or minimized Outcome: Progressing   Problem: Respiratory: Goal: Respiratory status will improve Outcome: Progressing   Problem: Skin Integrity: Goal: Wound healing without signs and symptoms of infection Outcome: Progressing Goal: Risk for impaired skin integrity will decrease Outcome: Progressing   Problem: Urinary Elimination: Goal: Ability to achieve  and maintain adequate renal perfusion and functioning will improve Outcome: Progressing   Problem: Education: Goal: Will demonstrate proper wound care and an understanding of methods to prevent future damage Outcome: Progressing Goal: Knowledge of disease or condition will improve Outcome: Progressing Goal: Knowledge of the prescribed therapeutic regimen will improve Outcome: Progressing Goal: Individualized Educational Video(s) Outcome: Progressing   Problem: Activity: Goal: Risk for activity intolerance will decrease Outcome: Progressing   Problem: Cardiac: Goal: Will achieve and/or maintain hemodynamic stability Outcome: Progressing   Problem: Clinical Measurements: Goal: Postoperative complications will be avoided or  minimized Outcome: Progressing   Problem: Respiratory: Goal: Respiratory status will improve Outcome: Progressing   Problem: Skin Integrity: Goal: Wound healing without signs and symptoms of infection Outcome: Progressing Goal: Risk for impaired skin integrity will decrease Outcome: Progressing   Problem: Urinary Elimination: Goal: Ability to achieve and maintain adequate renal perfusion and functioning will improve Outcome: Progressing

## 2020-09-27 NOTE — Progress Notes (Addendum)
CARDIAC REHAB PHASE I   PRE:  Rate/Rhythm: 82SR  BP:  Sitting: 114/83      SaO2: 92 RA  MODE:  Ambulation: 260 ft   POST:  Rate/Rhythm: 88 SR  BP:  Sitting: 120/78    SaO2: 87 RA --> 91 RA   Pt ambulated 210ft in hallway standby assist with front wheel walker. Pt denies pain, dizziness, or SOB. Pt returned to bed for IJ removal per RN request. D/c education completed with pt and daughter. Pt educated on importance of site care and monitoring incisions daily. Encouraged continued ambulation, IS use, and walks. Pt given in-the-tube sheet along with heart healthy and diabetic diets. Reviewed restrictions and exercise guidelines. Will refer to CRP II [place]. Pt hopeful for d/c tomorrow.  Pt requesting walker and BSC for home use. RN made aware.  [id number] Reynold Bowen, RN BSN 09/27/2020 11:35 AM

## 2020-09-27 NOTE — Progress Notes (Addendum)
      [address]       [place] [id number]             [phone]      4 Days Post-Op Procedure(s) (LRB): CORONARY ARTERY BYPASS GRAFTING (CABG), ON PUMP, TIMES THREE, USING BILATERAL INTERNAL MAMMARY ARTERIES AND LEFT RADIAL ARTERY (N/A) RADIAL ARTERY HARVEST (Left) TRANSESOPHAGEAL ECHOCARDIOGRAM (TEE) (N/A) INDOCYANINE GREEN FLUORESCENCE IMAGING (ICG) (N/A)   Subjective:  Doing well.  States his left arm is starting to hurt and is now numb/burning at times.  Wants to go home states food is bad.  Objective: Vital signs in last 24 hours: Temp:  [97.6 F (36.4 C)-98.7 F (37.1 C)] 98 F (36.7 C) (11/06 0827) Pulse Rate:  [58-88] 76 (11/06 0850) Cardiac Rhythm: Normal sinus rhythm (11/05 2050) Resp:  [16-23] 20 (11/06 0827) BP: (103-137)/(62-97) 137/97 (11/06 0827) SpO2:  [91 %-97 %] 92 % (11/06 0827) Weight:  [109.5 kg] 109.5 kg (11/06 0138)  Intake/Output from previous day: 11/05 0701 - 11/06 0700 In: 1229.7 [P.O.:480; I.V.:105.4; IV Piggyback:44.3] Out: 1650 [Urine:1650]  General appearance: alert, cooperative and no distress Heart: regular rate and rhythm Lungs: clear to auscultation bilaterally Abdomen: soft, non-tender; bowel sounds normal; no masses,  no organomegaly Extremities: edema trace Wound: radial site wtih bruising, parashtesias, pravena on sternotomy  Lab Results: Recent Labs    09/26/20 0326 09/26/20 1054  WBC 8.5 7.9  HGB 10.5* 11.1*  HCT 32.3* 34.6*  PLT 102* 112*   BMET:  Recent Labs    09/26/20 0326 09/27/20 0650  NA 135 135  K 3.8 3.5  CL 104 105  CO2 22 21*  GLUCOSE 208* 171*  BUN 19 14  CREATININE 0.93 0.80  CALCIUM 7.8* 7.4*    PT/INR: No results for input(s): LABPROT, INR in the last 72 hours. ABG    Component Value Date/Time   PHART 7.381 09/23/2020 2107   HCO3 19.7 (L) 09/23/2020 2107   TCO2 21 (L) 09/23/2020 2107   ACIDBASEDEF 4.0 (H) 09/23/2020 2107   O2SAT 88.0 09/23/2020 2107   CBG (last 3)    Recent Labs    09/26/20 1628 09/26/20 2129 09/27/20 0619  GLUCAP 264* 160* 164*    Assessment/Plan: S/P Procedure(s) (LRB): CORONARY ARTERY BYPASS GRAFTING (CABG), ON PUMP, TIMES THREE, USING BILATERAL INTERNAL MAMMARY ARTERIES AND LEFT RADIAL ARTERY (N/A) RADIAL ARTERY HARVEST (Left) TRANSESOPHAGEAL ECHOCARDIOGRAM (TEE) (N/A) INDOCYANINE GREEN FLUORESCENCE IMAGING (ICG) (N/A)  1. CV- PAF, currently in NSR, BP slightly elevated- will increase Lopressor to 25 mg BID, D/C IV Amiodarone, start oral at 200 mg BID 2. Pulm- wean oxygen as tolerated 3. Renal- creatinine WNL, K is at 3.5.Marland Kitchen continue Lasix, will supplement K 4. Neuro- left arm parasthesias from radial artery harvest, likely injury to sensory nerve during harvest, should improve with time 5. DM- sugars are controlled 6. Dispo- patient stable, increase Lopressor for additional HR/BP control, will d/c IV AMiodarone, start oral regimen, d/c central line, supplement K, if patient remains in NSR will plan to d/c home tomorrow   LOS: 8 days    Lowella Dandy, PA-C 09/27/2020  Patient seen yesterday missed  Signing  note  , and today home Sunday 11/7 I have seen and examined Florene Glen and agree with the above assessment  and plan.  Delight Ovens MD Beeper [phone] Office [phone] 09/28/2020 11:51 AM

## 2020-09-28 LAB — BASIC METABOLIC PANEL
Anion gap: 11 (ref 5–15)
BUN: 12 mg/dL (ref 8–23)
CO2: 22 mmol/L (ref 22–32)
Calcium: 8.6 mg/dL — ABNORMAL LOW (ref 8.9–10.3)
Chloride: 103 mmol/L (ref 98–111)
Creatinine, Ser: 0.87 mg/dL (ref 0.61–1.24)
GFR, Estimated: >60 mL/min (ref >60)
Glucose, Bld: 170 mg/dL — ABNORMAL HIGH (ref 70–99)
Potassium: 3.8 mmol/L (ref 3.5–5.1)
Sodium: 136 mmol/L (ref 135–145)

## 2020-09-28 LAB — GLUCOSE, CAPILLARY: Glucose-Capillary: 134 mg/dL — ABNORMAL HIGH (ref 70–99)

## 2020-09-28 MED ORDER — COLCHICINE 0.6 MG PO TABS
0.3000 mg | ORAL_TABLET | Freq: Two times a day (BID) | ORAL | 0 refills | Status: DC
Start: 2020-09-28 — End: 2020-10-10

## 2020-09-28 MED ORDER — POTASSIUM CHLORIDE CRYS ER 20 MEQ PO TBCR
20.0000 meq | EXTENDED_RELEASE_TABLET | Freq: Every day | ORAL | 0 refills | Status: DC
Start: 2020-09-28 — End: 2020-10-06

## 2020-09-28 MED ORDER — AMIODARONE HCL 200 MG PO TABS
200.0000 mg | ORAL_TABLET | Freq: Two times a day (BID) | ORAL | 1 refills | Status: DC
Start: 2020-09-28 — End: 2020-11-18

## 2020-09-28 MED ORDER — TRAMADOL HCL 50 MG PO TABS: 50.0000 mg | ORAL_TABLET | ORAL | 0 refills | Status: DC | PRN

## 2020-09-28 MED ORDER — ACETAMINOPHEN 500 MG PO TABS: 1000.0000 mg | ORAL_TABLET | Freq: Four times a day (QID) | ORAL | 0 refills | Status: AC | PRN

## 2020-09-28 MED ORDER — FUROSEMIDE 40 MG PO TABS: 40.0000 mg | ORAL_TABLET | Freq: Every day | ORAL | 0 refills | Status: DC

## 2020-09-28 MED ORDER — METOPROLOL SUCCINATE ER 50 MG PO TB24: 50.0000 mg | ORAL_TABLET | Freq: Every day | ORAL | 3 refills | Status: DC

## 2020-09-28 MED ORDER — LISINOPRIL 10 MG PO TABS
10.0000 mg | ORAL_TABLET | Freq: Every day | ORAL | Status: DC
  Administered 2020-09-28: 10 mg via ORAL
  Filled 2020-09-28: qty 1

## 2020-09-28 NOTE — TOC Transition Note (Signed)
Transition of Care (TOC) - CM/SW Discharge Note Donn Pierini RN, BSN Transitions of Care Unit 4E- RN Case Manager See Treatment Team for direct phone # Weekend Cross Coverage 2C   Patient Details  Name: ALON MAZOR MRN: [id number] Date of Birth: September 25, 1956  Transition of Care [facility]) CM/SW Contact:  Darrold Span, RN Phone Number: 09/28/2020, 9:38 AM   Clinical Narrative:    Pt stable for transition home today, per cardiac rehab pt needs RW and 3n1 for home- orders have been placed- call made to w/c [employer] [facility] line for referral. RW and 3n1 to be delivered to room prior to discharge.    Final next level of care: Home/Self Care Barriers to Discharge: No Barriers Identified   Patient Goals and CMS Choice Patient states their goals for this hospitalization and ongoing recovery are:: return home CMS Medicare.gov Compare Post Acute Care list provided to:: Patient Choice offered to / list presented to : NA  Discharge Placement               Home        Discharge Plan and Services   Discharge Planning Services: CM Consult Post Acute Care Choice: Durable Medical Equipment          DME Arranged: 3-N-1, Walker rolling DME Agency: [employer] Date DME Agency Contacted: 09/28/20 Time DME Agency Contacted: [phone] Representative spoke with at DME Agency: Shari [facility] HH Arranged: NA HH Agency: NA        Social Determinants of Health (SDOH) Interventions     Readmission Risk Interventions Readmission Risk Prevention Plan 09/28/2020  Transportation Screening Complete  PCP or Specialist Appt within 5-7 Days Complete  Home Care Screening Complete  Medication Review (RN CM) Complete  Some recent data might be hidden

## 2020-09-28 NOTE — Plan of Care (Signed)
Problem: Education: Goal: Knowledge of General Education information will improve Description: Including pain rating scale, medication(s)/side effects and non-pharmacologic comfort measures Outcome: Progressing   Problem: Health Behavior/Discharge Planning: Goal: Ability to manage health-related needs will improve Outcome: Progressing   Problem: Clinical Measurements: Goal: Ability to maintain clinical measurements within normal limits will improve Outcome: Progressing Goal: Will remain free from infection Outcome: Progressing Goal: Diagnostic test results will improve Outcome: Progressing Goal: Respiratory complications will improve Outcome: Progressing Goal: Cardiovascular complication will be avoided Outcome: Progressing   Problem: Activity: Goal: Risk for activity intolerance will decrease Outcome: Progressing   Problem: Nutrition: Goal: Adequate nutrition will be maintained Outcome: Progressing   Problem: Coping: Goal: Level of anxiety will decrease Outcome: Progressing   Problem: Elimination: Goal: Will not experience complications related to bowel motility Outcome: Progressing Goal: Will not experience complications related to urinary retention Outcome: Progressing   Problem: Pain Managment: Goal: General experience of comfort will improve Outcome: Progressing   Problem: Safety: Goal: Ability to remain free from injury will improve Outcome: Progressing   Problem: Skin Integrity: Goal: Risk for impaired skin integrity will decrease Outcome: Progressing   Problem: Education: Goal: Will demonstrate proper wound care and an understanding of methods to prevent future damage Outcome: Progressing Goal: Knowledge of disease or condition will improve Outcome: Progressing Goal: Knowledge of the prescribed therapeutic regimen will improve Outcome: Progressing Goal: Individualized Educational Video(s) Outcome: Progressing   Problem: Activity: Goal: Risk for  activity intolerance will decrease Outcome: Progressing   Problem: Cardiac: Goal: Will achieve and/or maintain hemodynamic stability Outcome: Progressing   Problem: Clinical Measurements: Goal: Postoperative complications will be avoided or minimized Outcome: Progressing   Problem: Respiratory: Goal: Respiratory status will improve Outcome: Progressing   Problem: Skin Integrity: Goal: Wound healing without signs and symptoms of infection Outcome: Progressing Goal: Risk for impaired skin integrity will decrease Outcome: Progressing   Problem: Urinary Elimination: Goal: Ability to achieve and maintain adequate renal perfusion and functioning will improve Outcome: Progressing   Problem: Education: Goal: Knowledge of General Education information will improve Description: Including pain rating scale, medication(s)/side effects and non-pharmacologic comfort measures Outcome: Progressing   Problem: Health Behavior/Discharge Planning: Goal: Ability to manage health-related needs will improve Outcome: Progressing   Problem: Clinical Measurements: Goal: Ability to maintain clinical measurements within normal limits will improve Outcome: Progressing Goal: Will remain free from infection Outcome: Progressing Goal: Diagnostic test results will improve Outcome: Progressing Goal: Respiratory complications will improve Outcome: Progressing Goal: Cardiovascular complication will be avoided Outcome: Progressing   Problem: Activity: Goal: Risk for activity intolerance will decrease Outcome: Progressing   Problem: Nutrition: Goal: Adequate nutrition will be maintained Outcome: Progressing   Problem: Coping: Goal: Level of anxiety will decrease Outcome: Progressing   Problem: Elimination: Goal: Will not experience complications related to bowel motility Outcome: Progressing Goal: Will not experience complications related to urinary retention Outcome: Progressing   Problem:  Pain Managment: Goal: General experience of comfort will improve Outcome: Progressing   Problem: Safety: Goal: Ability to remain free from injury will improve Outcome: Progressing   Problem: Skin Integrity: Goal: Risk for impaired skin integrity will decrease Outcome: Progressing   Problem: Education: Goal: Will demonstrate proper wound care and an understanding of methods to prevent future damage Outcome: Progressing Goal: Knowledge of disease or condition will improve Outcome: Progressing Goal: Knowledge of the prescribed therapeutic regimen will improve Outcome: Progressing Goal: Individualized Educational Video(s) Outcome: Progressing   Problem: Activity: Goal: Risk for activity intolerance will decrease Outcome:  Progressing   Problem: Cardiac: Goal: Will achieve and/or maintain hemodynamic stability Outcome: Progressing   Problem: Clinical Measurements: Goal: Postoperative complications will be avoided or minimized Outcome: Progressing   Problem: Respiratory: Goal: Respiratory status will improve Outcome: Progressing   Problem: Skin Integrity: Goal: Wound healing without signs and symptoms of infection Outcome: Progressing Goal: Risk for impaired skin integrity will decrease Outcome: Progressing   Problem: Urinary Elimination: Goal: Ability to achieve and maintain adequate renal perfusion and functioning will improve Outcome: Progressing

## 2020-09-28 NOTE — Discharge Instructions (Signed)

## 2020-10-02 ENCOUNTER — Other Ambulatory Visit: Payer: Self-pay | Admitting: Cardiothoracic Surgery

## 2020-10-02 DIAGNOSIS — Z951 Presence of aortocoronary bypass graft: Secondary | ICD-10-CM

## 2020-10-06 ENCOUNTER — Ambulatory Visit
Admission: RE | Admit: 2020-10-06 | Discharge: 2020-10-06 | Disposition: A | Discharge status: Home / Self Care | Payer: BC Managed Care – PPO | Source: Ambulatory Visit | Attending: Cardiothoracic Surgery | Admitting: Cardiothoracic Surgery

## 2020-10-06 ENCOUNTER — Ambulatory Visit (INDEPENDENT_AMBULATORY_CARE_PROVIDER_SITE_OTHER): Payer: Self-pay | Admitting: Surgical

## 2020-10-06 ENCOUNTER — Other Ambulatory Visit: Payer: Self-pay

## 2020-10-06 VITALS — BP 97/65 | HR 80 | Resp 20 | Ht 70.0 in | Wt 227.0 lb

## 2020-10-06 DIAGNOSIS — Z951 Presence of aortocoronary bypass graft: Secondary | ICD-10-CM | POA: Diagnosis not present

## 2020-10-06 DIAGNOSIS — I2510 Atherosclerotic heart disease of native coronary artery without angina pectoris: Secondary | ICD-10-CM

## 2020-10-06 DIAGNOSIS — I700 Atherosclerosis of aorta: Secondary | ICD-10-CM | POA: Diagnosis not present

## 2020-10-06 NOTE — Assessment & Plan Note (Signed)
Steady overall progress

## 2020-10-06 NOTE — Progress Notes (Addendum)
[address]       [place] [address]             [phone]      [name] [facility] Medical Record #[id number] Date of Birth: 27-Jun-1956  Referring: Sherren Mocha, MD Primary Care: Rory Percy, MD Primary Cardiologist: Kate Sable, MD (Inactive)   Chief Complaint:   POST OP FOLLOW UP CARDIOTHORACIC SURGERY OPERATIVE NOTE  Date of Procedure:    09/23/2020  Preoperative Diagnosis:      Severe 3-vessel Coronary Artery Disease  Postoperative Diagnosis:    Same  Procedure:        Coronary Artery Bypass Grafting x 3              Left Internal Mammary Artery to Distal Left Anterior Descending Coronary Artery; pedicled RIMA graft to Posterior Descending Coronary Artery; left radial artery Graft to 1st Diagonal Branch Coronary Artery Open left radial artery harvesting Bilateral IMA harvesting Completion graft surveillance with indocyanine green fluorescence imaging (SPY) Application of prevena incisional management system  Surgeon:        B. Murvin Natal, MD  Assistant:       Evonnie Pat, PA-C  Anesthesia:    GET  Operative Findings: ? preserved left ventricular systolic function ? Good quality internal mammary artery conduits ? Good quality left radial artery conduit ? Good quality target vessels for grafting  History of Present Illness:    Patient is a 64 year old male status post the above procedure seen in the office on today's date and routine postsurgical follow-up.  He reports that overall he feels as though he is doing well.  He is a little weak and states that it is taking a little longer to increase his strength that he had hoped.  He does not have any specific concerns, however.  He has had no fevers, chills or other significant constitutional symptoms.  He did note that his incisions have a little bit of erythema associated but no recent drainage.  He put Vaseline on his arm incision and I instructed him not to do that  any further.      Past Medical History:  Diagnosis Date  . Diabetes mellitus   . Hyperlipidemia   . Hypertension   . OSA (obstructive sleep apnea)   . Presence of stent in coronary artery   . S/P insertion of iliac artery stent      Social History   Tobacco Use  Smoking Status Former Smoker  . Types: Cigarettes  . Quit date: 04/02/2020  . Years since quitting: 0.5  Smokeless Tobacco Former Systems developer    Social History   Substance and Sexual Activity  Alcohol Use Yes     No Known Allergies  Current Outpatient Medications  Medication Sig Dispense Refill  . Blood Glucose Monitoring Suppl (TRUE METRIX AIR GLUCOSE METER) w/Device KIT USE AS DIRECTED 1 kit 0  . acetaminophen (TYLENOL) 500 MG tablet Take 2 tablets (1,000 mg total) by mouth every 6 (six) hours as needed. 30 tablet 0  . amiodarone (PACERONE) 200 MG tablet Take 1 tablet (200 mg total) by mouth 2 (two) times daily. 60 tablet 1  . aspirin EC 81 MG tablet Take 81 mg by mouth daily.    . cholecalciferol (VITAMIN D3) 25 MCG (1000 UNIT) tablet Take 1,000 Units by mouth daily.    . clopidogrel (PLAVIX) 75 MG tablet Take 1 tablet (75 mg total) by mouth daily with breakfast. 90  tablet 3  . colchicine 0.6 MG tablet Take 0.5 tablets (0.3 mg total) by mouth 2 (two) times daily. 60 tablet 0  . furosemide (LASIX) 40 MG tablet Take 1 tablet (40 mg total) by mouth daily. 5 tablet 0  . Global Inject Ease Lancets 30G MISC USE FOUR TIMES DAILY 100 each 0  . insulin detemir (LEVEMIR FLEXTOUCH) 100 UNIT/ML FlexPen Inject 25 Units into the skin at bedtime. 15 mL 3  . Insulin Pen Needle 32G X 4 MM MISC 1 box of 100 each 1 each 2  . isosorbide mononitrate (IMDUR) 30 MG 24 hr tablet TAKE 1 TABLET BY MOUTH EVERY DAY 90 tablet 1  . lisinopril-hydrochlorothiazide (PRINZIDE,ZESTORETIC) 10-12.5 MG per tablet Take 1 tablet by mouth daily.      . metFORMIN (GLUCOPHAGE-XR) 500 MG 24 hr tablet Take 500 mg by mouth 2 (two) times daily.    . metoprolol  succinate (TOPROL XL) 50 MG 24 hr tablet Take 1 tablet (50 mg total) by mouth daily. Take with or immediately following a meal. 30 tablet 3  . Multiple Vitamin (MULTIVITAMIN WITH MINERALS) TABS tablet Take 1 tablet by mouth daily.    . nitroGLYCERIN (NITROSTAT) 0.4 MG SL tablet Place 0.4 mg under the tongue every 5 (five) minutes as needed for chest pain.    . potassium chloride SA (KLOR-CON) 20 MEQ tablet Take 1 tablet (20 mEq total) by mouth daily. 5 tablet 0  . rosuvastatin (CRESTOR) 20 MG tablet Take 1 tablet (20 mg total) by mouth daily. 90 tablet 3  . Semaglutide,0.25 or 0.5MG/DOS, (OZEMPIC, 0.25 OR 0.5 MG/DOSE,) 2 MG/1.5ML SOPN Inject 0.5 mg into the skin once a week.     . traMADol (ULTRAM) 50 MG tablet Take 1 tablet (50 mg total) by mouth every 4 (four) hours as needed for moderate pain. 30 tablet 0   No current facility-administered medications for this visit.       Physical Exam: There were no vitals taken for this visit.  General appearance: alert, cooperative and no distress Heart: regular rate and rhythm Lungs: clear to auscultation bilaterally Abdomen: benign Extremities: no edema Wound: minor erethem of chest and radial incisions, no drainage, appears reactive.   Diagnostic Studies & Laboratory data:     Recent Radiology Findings:   DG Chest 2 View  Result Date: 10/06/2020 CLINICAL DATA:  Status post coronary artery bypass grafting EXAM: CHEST - 2 VIEW COMPARISON:  September 27, 2020 FINDINGS: The lungs are clear. Heart size and pulmonary vascularity are normal. No adenopathy. Patient is status post median sternotomy. There is aortic atherosclerosis. No bone lesions. IMPRESSION: Lungs clear. Heart size within normal limits. Postoperative change noted. No adenopathy appreciable. Aortic Atherosclerosis (ICD10-I70.0). Electronically Signed   By: Lowella Grip III M.D.   On: 10/06/2020 12:57      Recent Lab Findings: Lab Results  Component Value Date   WBC 7.9  09/26/2020   HGB 11.1 (L) 09/26/2020   HCT 34.6 (L) 09/26/2020   PLT 112 (L) 09/26/2020   GLUCOSE 170 (H) 09/28/2020   CHOL 101 09/20/2020   TRIG 191 (H) 09/20/2020   HDL 24 (L) 09/20/2020   LDLCALC 39 09/20/2020   ALT 19 09/20/2020   AST 17 09/20/2020   NA 136 09/28/2020   K 3.8 09/28/2020   CL 103 09/28/2020   CREATININE 0.87 09/28/2020   BUN 12 09/28/2020   CO2 22 09/28/2020   TSH 4.363 09/21/2020   INR 1.4 (H) 09/23/2020   HGBA1C  9.7 (H) 09/21/2020      Assessment / Plan: Patient is doing well overall 2 weeks post surgery.  We removed his staples in the office today.  He understands that he needs to continue to observe him closely.  He is not checking his sugars at home for no specific reason other than he just does not typically do it.  He is on multiple diabetic medications so I did emphasize the importance of diabetes management.  He understands and hopes to do better in this regard.  His chest x-ray was reviewed and it looks good without significant effusions or infiltrates.  We will see him again in the office in 3 weeks.  We will see him prior to that should he have any surgical difficulties.  He is scheduled to be seen in the cardiology office this week.  I did not change any of his current medications.    addendum: following staple removal a section of lower portion of sternotomy incis dehisced. It appeared clean without purulence. I sutured back together  with 7 3-0 nylon sutures following 1 percent lidocaine and betadine prep.   Will have patient cont with betadine to skin BID and sterile dressing.   Will have patient seen by Dr Orvan Seen in one week  Medication Changes: No orders of the defined types were placed in this encounter.     John Giovanni, PA-C 10/06/2020 1:01 PM

## 2020-10-06 NOTE — Patient Instructions (Signed)
Given verbal instructions in regard to activity progression

## 2020-10-08 ENCOUNTER — Other Ambulatory Visit: Payer: Self-pay

## 2020-10-08 MED ORDER — CEPHALEXIN 500 MG PO CAPS: 500.0000 mg | ORAL_CAPSULE | Freq: Three times a day (TID) | ORAL | 0 refills | Status: AC

## 2020-10-08 NOTE — Progress Notes (Signed)
Prescription sent in per Gershon Crane, [place] request.

## 2020-10-09 NOTE — Progress Notes (Signed)
Cardiology Office Note  Date: 10/10/2020   ID: [name] Gray, DOB September 13, 1956, MRN [id number]  PCP:  Rory Percy, MD  Cardiologist:  No primary care provider on file. Electrophysiologist:  None   Chief Complaint: Follow-up CAD, HLD, DM2, HTN, tobacco abuse,   History of Present Illness: [name] Gray is a 64 y.o. male with a history of CAD status post coronary artery bypass x3 on 11/2/202 with postop atrial fibrillation,1 DM 2, HLD, HTN, OSA, PVD.  Last seen by Dr. Bronson Ing 04/16/2020 after undergoing a DES to proximal RCA for 80% stenosis.  Also had mid to distal 80% RCA stenosis undergoing scoring balloon angioplasty.  This was performed on 04/03/2020.  On 04/05/2019 underwent atherectomy for mid LAD 80% stenosis followed by DES placement.  There was proximal LAD 70% stenosis treated with atherectomy at high speed followed by DES placement overlapping previous placed stent.  It was recommended the patient continue on uninterrupted DAPT therapy with aspirin and Plavix.  He admitted to doing better and had taken his Imdur for about 2 days after discharge and then discontinued it.  Had not used any sublingual nitroglycerin.  Not smoked for 2 weeks since discharge.  He was using nicotine patches.  He presented to [facility] emergency department on 09/19/2020 reporting history of 2-week episodes of on and off chest discomfort.  Initially thought was more due to acid reflux.  Over the days prior to presentation described as central chest pressure radiating to left arm and shoulder.  He stated he had associated shortness of breath which he described as exertional.  Described the intensity of the pain as 8 or 9 out of 10.  Took 3 sublingual nitroglycerin with relief but pain eventually returned.  He described the pain as being similar to before when he had stent placement.  He described his diabetes as been poorly controlled.  Initial troponins were elevated but not significantly rising.   Cardiology was consulted.  He eventually had three-vessel bypass with LIMA to distal LAD, pedicled RIMA graft to PDA, left radial artery graft to first diagonal, open left radial artery harvesting, bilateral IMA harvesting.  He developed postop atrial fibrillation but eventually converted to normal sinus rhythm.  He was placed on amiodarone.  He is here post CABG.  Recently saw the surgeon and was having staples removed from his chest.  And had some wound dehiscence.  Sutures were placed in the 30s office.  Antibiotics were prescribed.  Wound looks slightly red today with good wound approximation with some small amount of yellow exudate in the incision.  Patient states she has not started the antibiotic yet and does not want to.  Advised him he needed to take antibiotics to avoid any type of infection that may invade the sternum and cause osteomyelitis.  His left radial harvesting site looks good and wound is well without any evidence of redness, swelling, or infection noted.  States she feels better today than he has since the day he had surgery. Denies any anginal or exertional symptoms, palpitations or arrhythmias, orthostatic symptoms, CVA or TIA-like symptoms.  States his blood sugars have been elevated in the 190s this morning.  States she had not taken any of his diabetic medications.  He denies any bleeding issues, DVT or PE-like status user lower extremity edema.  He has a follow-up with Dr. Julien Girt on Monday, November 22 for recheck of wound.  He has a subsequent office visit on December 6 with Dr. Orvan Seen also.  Past Medical History:  Diagnosis Date  . Diabetes mellitus   . Hyperlipidemia   . Hypertension   . OSA (obstructive sleep apnea)   . Presence of stent in coronary artery   . S/P insertion of iliac artery stent     Past Surgical History:  Procedure Laterality Date  . CORONARY ARTERY BYPASS GRAFT N/A 09/23/2020   Procedure: CORONARY ARTERY BYPASS GRAFTING (CABG), ON PUMP, TIMES THREE,  USING BILATERAL INTERNAL MAMMARY ARTERIES AND LEFT RADIAL ARTERY;  Surgeon: Wonda Olds, MD;  Location: [place];  Service: Open Heart Surgery;  Laterality: N/A;  . CORONARY ATHERECTOMY N/A 04/04/2020   Procedure: CORONARY ATHERECTOMY;  Surgeon: Jettie Booze, MD;  Location: [place] [facility];  Service: Cardiovascular;  Laterality: N/A;  . CORONARY STENT INTERVENTION N/A 04/03/2020   Procedure: CORONARY STENT INTERVENTION;  Surgeon: Jettie Booze, MD;  Location: [place] [facility];  Service: Cardiovascular;  Laterality: N/A;  . INTRAVASCULAR PRESSURE WIRE/FFR STUDY N/A 09/19/2020   Procedure: INTRAVASCULAR PRESSURE WIRE/FFR STUDY;  Surgeon: Burnell Blanks, MD;  Location: [place] [facility];  Service: Cardiovascular;  Laterality: N/A;  . INTRAVASCULAR ULTRASOUND/IVUS N/A 04/03/2020   Procedure: Intravascular Ultrasound/IVUS;  Surgeon: Jettie Booze, MD;  Location: [place] [facility];  Service: Cardiovascular;  Laterality: N/A;  . INTRAVASCULAR ULTRASOUND/IVUS N/A 04/04/2020   Procedure: Intravascular Ultrasound/IVUS;  Surgeon: Jettie Booze, MD;  Location: [place] [facility];  Service: Cardiovascular;  Laterality: N/A;  . LEFT HEART CATH AND CORONARY ANGIOGRAPHY N/A 04/03/2020   Procedure: LEFT HEART CATH AND CORONARY ANGIOGRAPHY;  Surgeon: Jettie Booze, MD;  Location: [place] [facility];  Service: Cardiovascular;  Laterality: N/A;  . LEFT HEART CATH AND CORONARY ANGIOGRAPHY N/A 04/04/2020   Procedure: LEFT HEART CATH AND CORONARY ANGIOGRAPHY;  Surgeon: Jettie Booze, MD;  Location: [place] [facility];  Service: Cardiovascular;  Laterality: N/A;  . LEFT HEART CATH AND CORONARY ANGIOGRAPHY N/A 09/19/2020   Procedure: LEFT HEART CATH AND CORONARY ANGIOGRAPHY;  Surgeon: Burnell Blanks, MD;  Location: [place] [facility];  Service: Cardiovascular;  Laterality: N/A;  . RADIAL ARTERY HARVEST Left 09/23/2020   Procedure: RADIAL ARTERY HARVEST;   Surgeon: Wonda Olds, MD;  Location: [place];  Service: Open Heart Surgery;  Laterality: Left;  . TEE WITHOUT CARDIOVERSION N/A 09/23/2020   Procedure: TRANSESOPHAGEAL ECHOCARDIOGRAM (TEE);  Surgeon: Wonda Olds, MD;  Location: [place];  Service: Open Heart Surgery;  Laterality: N/A;    Current Outpatient Medications  Medication Sig Dispense Refill  . acetaminophen (TYLENOL) 500 MG tablet Take 2 tablets (1,000 mg total) by mouth every 6 (six) hours as needed. 30 tablet 0  . amiodarone (PACERONE) 200 MG tablet Take 1 tablet (200 mg total) by mouth 2 (two) times daily. 60 tablet 1  . aspirin EC 81 MG tablet Take 81 mg by mouth daily.    . Blood Glucose Monitoring Suppl (TRUE METRIX AIR GLUCOSE METER) w/Device KIT USE AS DIRECTED 1 kit 0  . cholecalciferol (VITAMIN D3) 25 MCG (1000 UNIT) tablet Take 1,000 Units by mouth daily.    . clopidogrel (PLAVIX) 75 MG tablet Take 1 tablet (75 mg total) by mouth daily with breakfast. 90 tablet 3  . Global Inject Ease Lancets 30G MISC USE FOUR TIMES DAILY 100 each 0  . insulin detemir (LEVEMIR FLEXTOUCH) 100 UNIT/ML FlexPen Inject 25 Units into the skin at bedtime. 15 mL 3  . Insulin Pen Needle 32G X 4  MM MISC 1 box of 100 each 1 each 2  . isosorbide mononitrate (IMDUR) 30 MG 24 hr tablet TAKE 1 TABLET BY MOUTH EVERY DAY 90 tablet 1  . lisinopril-hydrochlorothiazide (PRINZIDE,ZESTORETIC) 10-12.5 MG per tablet Take 1 tablet by mouth daily.      . metFORMIN (GLUCOPHAGE-XR) 500 MG 24 hr tablet Take 500 mg by mouth 2 (two) times daily.    . metoprolol succinate (TOPROL XL) 50 MG 24 hr tablet Take 1 tablet (50 mg total) by mouth daily. Take with or immediately following a meal. 30 tablet 3  . Multiple Vitamin (MULTIVITAMIN WITH MINERALS) TABS tablet Take 1 tablet by mouth daily.    . nitroGLYCERIN (NITROSTAT) 0.4 MG SL tablet Place 0.4 mg under the tongue every 5 (five) minutes as needed for chest pain.    . rosuvastatin (CRESTOR) 20 MG tablet Take 1 tablet  (20 mg total) by mouth daily. 90 tablet 3  . Semaglutide,0.25 or 0.5MG/DOS, (OZEMPIC, 0.25 OR 0.5 MG/DOSE,) 2 MG/1.5ML SOPN Inject 0.5 mg into the skin once a week.     . traMADol (ULTRAM) 50 MG tablet Take 1 tablet (50 mg total) by mouth every 4 (four) hours as needed for moderate pain. 30 tablet 0  . cephALEXin (KEFLEX) 500 MG capsule Take 1 capsule (500 mg total) by mouth 3 (three) times daily for 10 days. (Patient not taking: Reported on 10/10/2020) 30 capsule 0   No current facility-administered medications for this visit.   Allergies:  Patient has no known allergies.   Social History: The patient  reports that he quit smoking about 6 months ago. His smoking use included cigarettes. He has quit using smokeless tobacco. He reports current alcohol use. He reports that he does not use drugs.   Family History: The patient's family history includes Diabetes in his father; Heart disease in his father; Heart failure in his father; High blood pressure in his mother.   ROS:  Please see the history of present illness. Otherwise, complete review of systems is positive for none.  All other systems are reviewed and negative.   Physical Exam: VS:  BP 100/64   Pulse 78   Ht _0  (1.778 m)   Wt 225 lb 6.4 oz (102.2 kg)   SpO2 92%   BMI 32.34 kg/m , BMI Body mass index is 32.34 kg/m.  Wt Readings from Last 3 Encounters:  10/10/20 225 lb 6.4 oz (102.2 kg)  10/06/20 227 lb (103 kg)  09/28/20 236 lb 8.9 oz (107.3 kg)    General: Patient appears comfortable at rest. Neck: Supple, no elevated JVP or carotid bruits, no thyromegaly. Lungs: Clear to auscultation, nonlabored breathing at rest. Cardiac: Regular rate and rhythm, no S3 or significant systolic murmur, no pericardial rub. Abdomen: Soft, nontender, no hepatomegaly, bowel sounds present, no guarding or rebound. Extremities: No pitting edema, distal pulses 2+. Skin: Warm and dry.  Median sternotomy site with some mild redness and yellow  appearing exudate.  Recently had staples removed but wound dehisced somewhat.  Dr. Julien Girt placed sutures in the office and prescribed antibiotics.  Left radial artery graft site clean and dry with wound well approximated no evidence of infection or redness noted. Musculoskeletal: No kyphosis. Neuropsychiatric: Alert and oriented x3, affect grossly appropriate.  ECG:  EKG September 24, 2020: Sinus rhythm with first-degree AV block rate of 76, nonspecific T wave abnormality, prolonged QT with QT/QTc 422/474.  Recent Labwork: 09/20/2020: ALT 19; AST 17 09/21/2020: TSH 4.363 09/24/2020: Magnesium 2.3 09/26/2020:  Hemoglobin 11.1; Platelets 112 09/28/2020: BUN 12; Creatinine, Ser 0.87; Potassium 3.8; Sodium 136     Component Value Date/Time   CHOL 101 09/20/2020 0244   TRIG 191 (H) 09/20/2020 0244   HDL 24 (L) 09/20/2020 0244   CHOLHDL 4.2 09/20/2020 0244   VLDL 38 09/20/2020 0244   LDLCALC 39 09/20/2020 0244    Other Studies Reviewed Today:    INTRAVASCULAR PRESSURE WIRE/FFR STUDY  LEFT HEART CATH AND CORONARY ANGIOGRAPHY  Conclusion    Previously placed Prox LAD drug eluting stent is widely patent.  Balloon angioplasty was performed.  Prox RCA to Mid RCA lesion is 25% stenosed.  Dist RCA lesion is 10% stenosed.  Non-stenotic Prox RCA lesion was previously treated.  Mid RCA lesion is 99% stenosed.  Mid LAD lesion is 70% stenosed with 0% stenosed side branch in 2nd Diag.   1. The LAD is a large caliber vessel that courses to the apex. The proximal and mid vessel has a stented segment. There is a severe restenosis in the mid LAD stented segment in the stent that was placed in may 2021. (DFR=0.85).  2. The Circumflex is small with diffuse plaque 3. The RCA is a large dominant artery with stents from the ostium down through the distal vessel. There is severe restenosis in the mid stented segment. This area has 2 layers of stent and was treated in May 2021 with cutting balloon  angioplasty.   Recommendations: He has progression of disease in the stented segments of the LAD and RCA. The LAD stent was placed in may 2021. The mid RCA stented segment has 2 layers of stent and was treated with cutting balloon angioplasty in May 2021. The LAD restenosis is significant by pressure wire analysis. I have reviewed his case with Dr. Martinique and we both agree that his best option will be CABG.  He will be admitted to telemetry. He is pain free. Will arrange an echo. Will call CT surgery consult for CABG. Will stop Plavix. Continue ASA. Begin IV heparin 8 hours post sheath pull.   Diagnostic Dominance: Right      Echocardiogram 09/20/2020 1. Left ventricular ejection fraction, by estimation, is 50 to 55%. The left ventricle has low normal function. Difficult to assess wall motion due to incomplete visualization of LV myocardium. Based on limited views, all LV segments appear to have low normal contractility. There is mild concentric left ventricular hypertrophy. Left ventricular diastolic parameters are consistent with Grade I diastolic dysfunction (impaired relaxation). 2. Right ventricular systolic function is mildly reduced. The right ventricular size is normal. 3. The mitral valve is normal in structure. Trivial mitral valve regurgitation. 4. The inferior vena cava is normal in size with greater than 50% respiratory variability, suggesting right atrial pressure of 3 mmHg. 5. The aortic valve is grossly normal   Carotid artery duplex study pre-CABG 09/20/2020 Summary:  Right Carotid: Velocities in the right ICA are consistent with a 40-59%         stenosis.   Left Carotid: Velocities in the left ICA are consistent with a 1-39%  stenosis.  Vertebrals: Bilateral vertebral arteries demonstrate antegrade flow.  Subclavians: Normal flow hemodynamics were seen in bilateral subclavian        arteri    Cath 04/03/2020  Prox RCA lesion is 80% stenosed. A  drug-eluting stent was successfully placed using a SYNERGY XD 3.50X20, postdilated to 3.8 mm. Stent size was optimized with IVUS.  Post intervention, there is a 0% residual stenosis.  Mid RCA to Dist RCA lesion is 80% stenosed. THis was in stent restenosis noted by IVUS. Scoring balloon angioplasty was performed using a BALLOON WOLVERINE 3.50X10.  Post intervention, there is a 20% residual stenosis.  Dist RCA lesion is 10% stenosed.  Mid LAD lesion is 80% stenosed. Long, heavily calcified lesion.  The left ventricular systolic function is normal.  LV end diastolic pressure is normal.  The left ventricular ejection fraction is 55-65% by visual estimate.  There is no aortic valve stenosis.  A drug-eluting stent was successfully placed using a SYNERGY XD 3.50X20.  Will plan to bring the patient back for orbital atherectomy of the LAD, tomorrow.  Continue DAPT with aggressive secondary prevention.  I conveyed the results and plans for orbital atherectomy, to his wife [name] 04/04/2020  Prox RCA to Mid RCA lesion is 25% stenosed.  Dist RCA lesion is 10% stenosed.  Mid RCA to Dist RCA lesion is 20% stenosed.  Previously placed Prox RCA drug eluting stent is widely patent. PTCA site is widely patent.  Mid LAD lesion is 80% stenosed. This was treated with atherectomy. A drug-eluting stent was successfully placed using a SYNERGY XD 3.0X38, postdilated to 3.75 mm.  Post intervention, there is a 0% residual stenosis.  Prox LAD lesion is 70% stenosed. This was treated with atherectomy at high speed. A drug-eluting stent was successfully placed overlapping the above stent, using a SYNERGY XD 3.50X16, postdilated to 3.8 mm.  Post intervention, there is a 0% residual stenosis.  LV end diastolic pressure is normal.  There is no aortic valve stenosis.  Slow flow in jailed diagonal vessel, but improving with time.  Successful PCI of LAD. Transient diagonal occlusion  with stent placement. Will see how his chest pain does. Post procedure ECG showed some subtle lateral ST changes likely related to diagonal stenosis.   Information conveyed to his wife.  Aggressive secondary prevention including smoking cessation.   Will start IV NTG to help with BP and help improve flow in diagonal vessel.  Assessment and Plan:  1. CAD in native artery   2. Essential hypertension, benign   3. Hyperlipidemia LDL goal <70   4. Type 2 diabetes mellitus without complication, unspecified whether long term insulin use ([place])   5. Tobacco abuse    1. CAD in native artery/status post CABG x3 Status post three-vessel bypass with LIMA-LAD, pedicle or may to PDA, left radial artery to first diagonal, left radial artery harvesting prolonged bilateral IMA harvesting.  Continue aspirin 81 mg daily.  Plavix 75 mg daily.  Toprol-XL 50 mg daily.  Nitroglycerin 0.4 mg as needed.  Had some wound dehiscence after staples removed at [facility] office.  CTS surgeon placed sutures and gave patient antibiotics prophylactically to prevent infection.  Patient has not started antibiotics yet and voiced that he really did not want to start the medication.  Highly advised patient to take the antibiotics to prevent possible infection which may invade the sternum causing osteomyelitis.Marland Kitchen  He verbalizes understanding.   2. Essential hypertension, benign Blood pressure on the low side today at 100/64.  He is tolerating well and denies any dizziness, shortness of breath, activity intolerance.  Continue lisinopril HCTZ 10/12.5 mg p.o. daily.  Continue Toprol-XL 50 mg p.o. daily  3. Hyperlipidemia LDL goal <70 Continue Crestor 20 mg p.o. daily.  Recent lipid panel TC 101, TG 191, HDL 24, LDL 39.  4. Type 2 diabetes mellitus without complication, unspecified whether long term insulin  use [facility]) Patient states his blood sugar was in the 190s range today prior to taking any of his antihyperglycemic  medications.  5. Tobacco abuse Patient has not smoked since surgery.  Advised him not to start back.  6.  Postop atrial fibrillation Patient went into atrial fibrillation postop but resolved.  He continues on amiodarone 200 mg p.o. twice daily.  EKG today shows normal sinus rhythm rate of 77.  Medication Adjustments/Labs and Tests Ordered: Current medicines are reviewed at length with the patient today.  Concerns regarding medicines are outlined above.   Disposition: Follow-up with Dr. Domenic Polite or APP 3 months Signed, Levell July, NP 10/10/2020 9:59 AM     [facility] [place], [place], [place] [address] Phone: [phone]; Fax: [phone]

## 2020-10-10 ENCOUNTER — Encounter: Payer: Self-pay | Admitting: Family Medicine

## 2020-10-10 ENCOUNTER — Ambulatory Visit: Payer: BC Managed Care – PPO | Admitting: Family Medicine

## 2020-10-10 VITALS — BP 100/64 | HR 78 | Ht 70.0 in | Wt 225.4 lb

## 2020-10-10 DIAGNOSIS — Z720 Tobacco use: Secondary | ICD-10-CM

## 2020-10-10 DIAGNOSIS — E119 Type 2 diabetes mellitus without complications: Secondary | ICD-10-CM | POA: Diagnosis not present

## 2020-10-10 DIAGNOSIS — E785 Hyperlipidemia, unspecified: Secondary | ICD-10-CM | POA: Diagnosis not present

## 2020-10-10 DIAGNOSIS — I10 Essential (primary) hypertension: Secondary | ICD-10-CM

## 2020-10-10 DIAGNOSIS — I2510 Atherosclerotic heart disease of native coronary artery without angina pectoris: Secondary | ICD-10-CM | POA: Diagnosis not present

## 2020-10-10 NOTE — Patient Instructions (Signed)
Medication Instructions:  Continue all current medications.  Labwork: none  Testing/Procedures: none  Follow-Up: 3 months   Any Other Special Instructions Will Be Listed Below (If Applicable).  If you need a refill on your cardiac medications before your next appointment, please call your pharmacy.  

## 2020-10-13 ENCOUNTER — Other Ambulatory Visit: Payer: Self-pay

## 2020-10-13 ENCOUNTER — Ambulatory Visit: Payer: Self-pay | Admitting: Cardiothoracic Surgery

## 2020-10-13 VITALS — BP 135/85 | HR 79 | Temp 97.5°F | Resp 18 | Ht 70.0 in

## 2020-10-13 DIAGNOSIS — Z951 Presence of aortocoronary bypass graft: Secondary | ICD-10-CM

## 2020-10-13 DIAGNOSIS — Z5189 Encounter for other specified aftercare: Secondary | ICD-10-CM

## 2020-10-14 NOTE — Progress Notes (Signed)
64 year old man is status post CABG x3 on 09/23/2020 using an arterial grafting strategy.  He did well after surgery and was discharged several days later.  He was recently seen and had his sternal incision staples removed with additional sutures placed for some reason.  He now returns for wound check.  He has no other complaints and denies angina or shortness of breath  Physical exam: BP 135/85 (BP Location: Right Arm, Patient Position: Sitting)   Pulse 79   Temp (!) 97.5 F (36.4 C)   Resp 18   Ht 5\' 10"  (1.778 m)   SpO2 96%   BMI 32.34 kg/m  Well-appearing no acute distress Clear to auscultation bilaterally Regular rate and rhythm Incision is mildly erythematous at the lower third.  The nylon sutures are removed and the wound is Steri-Stripped  Imaging: Chest x-ray from 10/06/2020 is reviewed no airspace disease and no pleural space disease  Impression: Doing well after surgery  Plan: Follow-up with thoracic surgery as needed Follow-up with [facility] cardiology in [place] to drive  [name] Nalepa Z. [place], MD [phone]

## 2020-10-24 ENCOUNTER — Other Ambulatory Visit: Payer: Self-pay | Admitting: Cardiothoracic Surgery

## 2020-10-24 DIAGNOSIS — Z951 Presence of aortocoronary bypass graft: Secondary | ICD-10-CM

## 2020-10-27 ENCOUNTER — Encounter: Payer: Self-pay | Admitting: Cardiothoracic Surgery

## 2020-11-06 ENCOUNTER — Encounter: Payer: Self-pay | Admitting: Physician Assistant

## 2020-11-06 ENCOUNTER — Ambulatory Visit (INDEPENDENT_AMBULATORY_CARE_PROVIDER_SITE_OTHER): Payer: Self-pay | Admitting: Physician Assistant

## 2020-11-06 ENCOUNTER — Other Ambulatory Visit: Payer: Self-pay

## 2020-11-06 VITALS — BP 150/80 | HR 90 | Temp 97.6°F | Resp 20 | Ht 70.0 in | Wt 226.0 lb

## 2020-11-06 DIAGNOSIS — Z951 Presence of aortocoronary bypass graft: Secondary | ICD-10-CM

## 2020-11-06 DIAGNOSIS — I2510 Atherosclerotic heart disease of native coronary artery without angina pectoris: Secondary | ICD-10-CM

## 2020-11-06 DIAGNOSIS — Z5189 Encounter for other specified aftercare: Secondary | ICD-10-CM

## 2020-11-06 NOTE — Patient Instructions (Addendum)
Patient instructed to take Keflex three times daily until finished Patient instructed to cleanse wound with soap and water. Use dry 4x4's to small area draining and change daily and PRN.

## 2020-11-06 NOTE — Progress Notes (Signed)
HPI:  Patient returns for follow up regarding sternal redness and drainage. The patient is s/p CABG x 3 by Dr. Orvan Seen. He was seen on 10/06/2020 as his lower sternal incision dehisced. PA Wayne Gold placed several sutures as well as patient was given a prescription for Keflex. Patient was last seen in follow up by Dr. Orvan Seen on 10/13/2020. Nylon sutures were removed and lower sternal incision had mild erythema. Dr. Orvan Seen said he would see patient PRN.  Patient contacted the office today as he has drainage from the mid sternal incision. He states he has some Keflex left as he did not take all of the pills previously. He denies fever or chills.   Current Outpatient Medications  Medication Sig Dispense Refill  . acetaminophen (TYLENOL) 500 MG tablet Take 2 tablets (1,000 mg total) by mouth every 6 (six) hours as needed. 30 tablet 0  . amiodarone (PACERONE) 200 MG tablet Take 1 tablet (200 mg total) by mouth 2 (two) times daily. 60 tablet 1  . aspirin EC 81 MG tablet Take 81 mg by mouth daily.    . Blood Glucose Monitoring Suppl (TRUE METRIX AIR GLUCOSE METER) w/Device KIT USE AS DIRECTED 1 kit 0  . cholecalciferol (VITAMIN D3) 25 MCG (1000 UNIT) tablet Take 1,000 Units by mouth daily.    . clopidogrel (PLAVIX) 75 MG tablet Take 1 tablet (75 mg total) by mouth daily with breakfast. 90 tablet 3  . Global Inject Ease Lancets 30G MISC USE FOUR TIMES DAILY 100 each 0  . insulin detemir (LEVEMIR FLEXTOUCH) 100 UNIT/ML FlexPen Inject 25 Units into the skin at bedtime. 15 mL 3  . Insulin Pen Needle 32G X 4 MM MISC 1 box of 100 each 1 each 2  . isosorbide mononitrate (IMDUR) 30 MG 24 hr tablet TAKE 1 TABLET BY MOUTH EVERY DAY 90 tablet 1  . lisinopril-hydrochlorothiazide (PRINZIDE,ZESTORETIC) 10-12.5 MG per tablet Take 1 tablet by mouth daily.      . metFORMIN (GLUCOPHAGE-XR) 500 MG 24 hr tablet Take 500 mg by mouth 2 (two) times daily.    . metoprolol succinate (TOPROL XL) 50 MG 24 hr tablet Take 1  tablet (50 mg total) by mouth daily. Take with or immediately following a meal. 30 tablet 3  . Multiple Vitamin (MULTIVITAMIN WITH MINERALS) TABS tablet Take 1 tablet by mouth daily.    . nitroGLYCERIN (NITROSTAT) 0.4 MG SL tablet Place 0.4 mg under the tongue every 5 (five) minutes as needed for chest pain.    . rosuvastatin (CRESTOR) 20 MG tablet Take 1 tablet (20 mg total) by mouth daily. 90 tablet 3  . Semaglutide,0.25 or 0.5MG /DOS, (OZEMPIC, 0.25 OR 0.5 MG/DOSE,) 2 MG/1.5ML SOPN Inject 0.5 mg into the skin once a week.     . traMADol (ULTRAM) 50 MG tablet Take 1 tablet (50 mg total) by mouth every 4 (four) hours as needed for moderate pain. 30 tablet 0  Vital Signs: BP 150/80, HR 90, RR 20, temp 97.6 F   Physical Exam: CV-RRR Sternal wound-2 eschars on chest tube wounds. Tiny pin hole opening mid sternal wound that has light purulent like discharge with expression of skin. There is NO cellulitis. Eschar present just below this. Lower portion of sternal wound (that was closed with Nylon suture) well healed and no sign of infection. Sternum is stable   Impression and Plan: Although he has no evidence of cellulitis, given his history of poorly controlled diabetes and continued smoking (although decreased) along with slight purulent  like drainage, I  Instructed the patient to take the antibiotic three times daily with food and take until gone. Patient is to use soap and water and cleanse wound daily. He is to use dry 4x4 with tape and change daily and PRN. He was instructed to stop using Betadine and to not use Neosporin. He will return on Monday 11/10/2020 for another sternal wound evaluation. He was instructed if drainage increases or develops redness, to contact the office sooner.   Nani Skillern, PA-C [facility] [phone]

## 2020-11-10 ENCOUNTER — Other Ambulatory Visit: Payer: Self-pay

## 2020-11-10 ENCOUNTER — Ambulatory Visit (INDEPENDENT_AMBULATORY_CARE_PROVIDER_SITE_OTHER): Payer: Self-pay | Admitting: Physician Assistant

## 2020-11-10 VITALS — BP 137/80 | HR 88 | Temp 97.8°F | Resp 20 | Ht 70.0 in | Wt 225.0 lb

## 2020-11-10 DIAGNOSIS — Z5189 Encounter for other specified aftercare: Secondary | ICD-10-CM

## 2020-11-10 DIAGNOSIS — Z951 Presence of aortocoronary bypass graft: Secondary | ICD-10-CM

## 2020-11-10 DIAGNOSIS — I2510 Atherosclerotic heart disease of native coronary artery without angina pectoris: Secondary | ICD-10-CM

## 2020-11-10 NOTE — Patient Instructions (Signed)
-  Complete the oral antibiotics as previously prescribed -Due to sternal wound and dry.  Okay to discontinue dressing changes -Contact us if there is any recurrence of drainage or if any lesion or pain develop on the incision.

## 2020-11-10 NOTE — Progress Notes (Signed)
HPI: Patient returns for scheduled postoperative follow-up having undergone CABG x3 on 09/23/2020 by Dr. Orvan Seen.  Following discharge, he had a superficial dehiscence of the lower sternal incision that was managed in the office with placement of nylon sutures.  Sutures were removed about 1 week later and the sternal wound was healing appropriately.  Presented again last week for some sternal redness and drainage at the top end of this incision.  He was advised to take oral antibiotics for 1 week, keep the wound clean with soap and water and apply a 4 x 4 daily and as needed.   Since last week's visit, Mr. [name] Gray says he feels fine.  He denies any fever or chills.  He last noticed any drainage at all from his sternal wound 3 days ago.  The dressing removed in the office today looks completely dry.   Current Outpatient Medications  Medication Sig Dispense Refill  . acetaminophen (TYLENOL) 500 MG tablet Take 2 tablets (1,000 mg total) by mouth every 6 (six) hours as needed. 30 tablet 0  . amiodarone (PACERONE) 200 MG tablet Take 1 tablet (200 mg total) by mouth 2 (two) times daily. 60 tablet 1  . aspirin EC 81 MG tablet Take 81 mg by mouth daily.    . Blood Glucose Monitoring Suppl (TRUE METRIX AIR GLUCOSE METER) w/Device KIT USE AS DIRECTED 1 kit 0  . cephALEXin (KEFLEX) 500 MG capsule Take 500 mg by mouth 3 (three) times daily.    . cholecalciferol (VITAMIN D3) 25 MCG (1000 UNIT) tablet Take 1,000 Units by mouth daily.    . clopidogrel (PLAVIX) 75 MG tablet Take 1 tablet (75 mg total) by mouth daily with breakfast. 90 tablet 3  . Global Inject Ease Lancets 30G MISC USE FOUR TIMES DAILY 100 each 0  . insulin detemir (LEVEMIR FLEXTOUCH) 100 UNIT/ML FlexPen Inject 25 Units into the skin at bedtime. 15 mL 3  . Insulin Pen Needle 32G X 4 MM MISC 1 box of 100 each 1 each 2  . isosorbide mononitrate (IMDUR) 30 MG 24 hr tablet TAKE 1 TABLET BY MOUTH EVERY DAY 90 tablet 1  . lisinopril-hydrochlorothiazide  (PRINZIDE,ZESTORETIC) 10-12.5 MG per tablet Take 1 tablet by mouth daily.    . metFORMIN (GLUCOPHAGE-XR) 500 MG 24 hr tablet Take 500 mg by mouth 2 (two) times daily.    . metoprolol succinate (TOPROL XL) 50 MG 24 hr tablet Take 1 tablet (50 mg total) by mouth daily. Take with or immediately following a meal. 30 tablet 3  . Multiple Vitamin (MULTIVITAMIN WITH MINERALS) TABS tablet Take 1 tablet by mouth daily.    . nitroGLYCERIN (NITROSTAT) 0.4 MG SL tablet Place 0.4 mg under the tongue every 5 (five) minutes as needed for chest pain.    . rosuvastatin (CRESTOR) 20 MG tablet Take 1 tablet (20 mg total) by mouth daily. 90 tablet 3  . Semaglutide,0.25 or 0.5MG /DOS, (OZEMPIC, 0.25 OR 0.5 MG/DOSE,) 2 MG/1.5ML SOPN Inject 0.5 mg into the skin once a week.     . traMADol (ULTRAM) 50 MG tablet Take 1 tablet (50 mg total) by mouth every 4 (four) hours as needed for moderate pain. 30 tablet 0   No current facility-administered medications for this visit.    Physical Exam:  Vital signs: Blood pressure 137/80, pulse 88, temperature 97.8, respirations 20, oxygen saturation 96%   Dressing removed from the top end of the sternal incision was completely dry.  Sternum is stable.  Skin edges well approximated  and healing.  There is 1 small area of eschar 2 cm in length near the lower end of the incision.  There is no surrounding inflammation and no drainage could be expressed from the site.  Diagnostic Tests: None  Impression / Plan: Satisfactory sternal wound healing following coronary bypass grafting in November.  I see no reason to continue with the dressing changes.  He has been on antibiotics and has a couple more days to complete a 7-day course and encouraged him to do so.  He has scheduled follow-up with Dr. Domenic Polite in the [place] in March.  I see no reason for any further follow-up with [place] since the wound  is now completely dry and has been so for the past 3 days.  I asked [name] to contact us should  he have any additional problems with this wound.    Antony Odea, PA-C [facility] [phone]

## 2020-11-18 ENCOUNTER — Other Ambulatory Visit: Payer: Self-pay

## 2020-11-18 MED ORDER — AMIODARONE HCL 200 MG PO TABS
200.0000 mg | ORAL_TABLET | Freq: Two times a day (BID) | ORAL | 3 refills | Status: DC
Start: 2020-11-18 — End: 2021-02-26

## 2020-11-18 NOTE — Telephone Encounter (Signed)
Medication refill for Amiodarone HCL 200 mg tablet sent to [facility].

## 2020-12-04 ENCOUNTER — Other Ambulatory Visit: Payer: Self-pay | Admitting: Surgical

## 2020-12-04 ENCOUNTER — Other Ambulatory Visit: Payer: Self-pay | Admitting: Physician Assistant

## 2020-12-04 ENCOUNTER — Telehealth: Payer: Self-pay | Admitting: *Deleted

## 2020-12-04 MED ORDER — CEPHALEXIN 500 MG PO CAPS: 500.0000 mg | ORAL_CAPSULE | Freq: Three times a day (TID) | ORAL | 0 refills | Status: AC

## 2020-12-04 NOTE — Progress Notes (Signed)
      [address]       [place] [address]             [phone]     Small amount of sternal incision breakdown. Have sent Keflex 500 po TID x 7  to patients pharmacy and he will be seen in office next week. Prior to that if worsens  Rowe Clack, PA-C

## 2020-12-04 NOTE — Telephone Encounter (Signed)
[name] Gray called regarding MyChart message sent about new drainage and opening of sternal wound s/p CABG 09/23/20 by Dr. Vickey Sages. Pt sent in photo of wound to RN office phone and was viewed by Webb Laws, PA. Per pt, wound was pressed upon and produced thick white discharge and began to open. Pt denies fever. Per PA, an antibiotic was sent in to pt's pharmacy. Pt to be seen in the office next week for wound assessment. Message left on pt VM regarding antibiotic.

## 2020-12-08 ENCOUNTER — Ambulatory Visit: Payer: BC Managed Care – PPO

## 2020-12-11 ENCOUNTER — Encounter: Payer: Self-pay | Admitting: Cardiothoracic Surgery

## 2020-12-11 ENCOUNTER — Other Ambulatory Visit: Payer: Self-pay

## 2020-12-11 ENCOUNTER — Ambulatory Visit: Payer: Self-pay | Admitting: Cardiothoracic Surgery

## 2020-12-11 VITALS — BP 164/91 | HR 80 | Resp 20 | Wt 238.4 lb

## 2020-12-11 DIAGNOSIS — Z951 Presence of aortocoronary bypass graft: Secondary | ICD-10-CM

## 2020-12-11 MED ORDER — CEPHALEXIN 500 MG PO CAPS: 500.0000 mg | ORAL_CAPSULE | Freq: Four times a day (QID) | ORAL | 0 refills | Status: AC

## 2020-12-14 ENCOUNTER — Other Ambulatory Visit: Payer: Self-pay | Admitting: Physician Assistant

## 2020-12-15 ENCOUNTER — Other Ambulatory Visit (HOSPITAL_COMMUNITY): Payer: BC Managed Care – PPO

## 2021-01-09 NOTE — Progress Notes (Signed)
The patient is status post coronary bypass grafting on September 23, 2020.  He did well but suffered some minor breakdown of the wound in the midportion.  He returns for evaluation.  He has had no fevers or chills or sternal instability since last seen  Physical exam:  BP (!) 164/91 (BP Location: Left Arm, Patient Position: Sitting, Cuff Size: Large)   Pulse 80   Resp 20   Wt 108.1 kg   SpO2 97% Comment: RA  BMI 34.21 kg/m  Well-appearing no acute distress The wound is well-healed and intact.  There is no sternal click  Imaging: No new studies  Impression: Doing well after CABG  Plan: Follow-up as needed.  Continue present management  Shirlette Scarber Z. Vickey Sages, MD [phone]

## 2021-01-20 ENCOUNTER — Other Ambulatory Visit: Payer: Self-pay | Admitting: Family Medicine

## 2021-01-21 ENCOUNTER — Encounter: Payer: Self-pay | Admitting: Cardiology

## 2021-01-21 NOTE — Progress Notes (Deleted)
Cardiology Office Note  Date: 01/21/2021   ID: [name] Gray, DOB Jun 28, 1956, MRN [id number]  PCP:  Rory Percy, MD  Cardiologist:  Rozann Lesches, MD Electrophysiologist:  None   No chief complaint on file.   History of Present Illness: [name] Gray is a 65 y.o. male former patient of Dr. Bronson Ing now presenting to establish follow-up with me.  I reviewed his records and updated the chart.  He was last seen in November 2021 by Mr. Leonides Sake NP.  He is status post CABG in November 2021 with LIMA to LAD, RIMA to PDA, and left radial to first diagonal.  He had subsequent follow-up follow-up with [facility], surgery performed by Dr. Orvan Seen.  Superficial lower sternal dehiscence noted, managed with sutures and antibiotics.  Last encounter with Dr. Orvan Seen was in late January.  Patient had postoperative atrial fibrillation per chart review.  He was kept on amiodarone at last visit with Mr. Leonides Sake NP, in sinus rhythm at that time.  Past Medical History:  Diagnosis Date  . CAD (coronary artery disease)    Multivessel status post previous stent interventions and ultimately CABG in November 2021  . Essential hypertension   . Hyperlipidemia   . OSA (obstructive sleep apnea)   . Postoperative atrial fibrillation ([place])   . Type 2 diabetes mellitus ([place])     Past Surgical History:  Procedure Laterality Date  . CORONARY ARTERY BYPASS GRAFT N/A 09/23/2020   Procedure: CORONARY ARTERY BYPASS GRAFTING (CABG), ON PUMP, TIMES THREE, USING BILATERAL INTERNAL MAMMARY ARTERIES AND LEFT RADIAL ARTERY;  Surgeon: Wonda Olds, MD;  Location: [place];  Service: Open Heart Surgery;  Laterality: N/A;  . CORONARY ATHERECTOMY N/A 04/04/2020   Procedure: CORONARY ATHERECTOMY;  Surgeon: Jettie Booze, MD;  Location: [place] [facility];  Service: Cardiovascular;  Laterality: N/A;  . CORONARY STENT INTERVENTION N/A 04/03/2020   Procedure: CORONARY STENT INTERVENTION;  Surgeon: Jettie Booze, MD;   Location: [place] [facility];  Service: Cardiovascular;  Laterality: N/A;  . INTRAVASCULAR PRESSURE WIRE/FFR STUDY N/A 09/19/2020   Procedure: INTRAVASCULAR PRESSURE WIRE/FFR STUDY;  Surgeon: Burnell Blanks, MD;  Location: [place] [facility];  Service: Cardiovascular;  Laterality: N/A;  . INTRAVASCULAR ULTRASOUND/IVUS N/A 04/03/2020   Procedure: Intravascular Ultrasound/IVUS;  Surgeon: Jettie Booze, MD;  Location: [place] [facility];  Service: Cardiovascular;  Laterality: N/A;  . INTRAVASCULAR ULTRASOUND/IVUS N/A 04/04/2020   Procedure: Intravascular Ultrasound/IVUS;  Surgeon: Jettie Booze, MD;  Location: [place] [facility];  Service: Cardiovascular;  Laterality: N/A;  . LEFT HEART CATH AND CORONARY ANGIOGRAPHY N/A 04/03/2020   Procedure: LEFT HEART CATH AND CORONARY ANGIOGRAPHY;  Surgeon: Jettie Booze, MD;  Location: [place] [facility];  Service: Cardiovascular;  Laterality: N/A;  . LEFT HEART CATH AND CORONARY ANGIOGRAPHY N/A 04/04/2020   Procedure: LEFT HEART CATH AND CORONARY ANGIOGRAPHY;  Surgeon: Jettie Booze, MD;  Location: [place] [facility];  Service: Cardiovascular;  Laterality: N/A;  . LEFT HEART CATH AND CORONARY ANGIOGRAPHY N/A 09/19/2020   Procedure: LEFT HEART CATH AND CORONARY ANGIOGRAPHY;  Surgeon: Burnell Blanks, MD;  Location: [place] [facility];  Service: Cardiovascular;  Laterality: N/A;  . RADIAL ARTERY HARVEST Left 09/23/2020   Procedure: RADIAL ARTERY HARVEST;  Surgeon: Wonda Olds, MD;  Location: [place];  Service: Open Heart Surgery;  Laterality: Left;  . TEE WITHOUT CARDIOVERSION N/A 09/23/2020   Procedure: TRANSESOPHAGEAL ECHOCARDIOGRAM (TEE);  Surgeon: Wonda Olds, MD;  Location: [facility];  Service: Open Heart Surgery;  Laterality: N/A;    Current Outpatient Medications  Medication Sig Dispense Refill  . acetaminophen (TYLENOL) 500 MG tablet Take 2 tablets (1,000 mg total) by mouth every 6 (six) hours as needed. 30  tablet 0  . amiodarone (PACERONE) 200 MG tablet Take 1 tablet (200 mg total) by mouth 2 (two) times daily. 180 tablet 3  . aspirin EC 81 MG tablet Take 81 mg by mouth daily.    . Blood Glucose Monitoring Suppl (TRUE METRIX AIR GLUCOSE METER) w/Device KIT USE AS DIRECTED 1 kit 0  . cholecalciferol (VITAMIN D3) 25 MCG (1000 UNIT) tablet Take 1,000 Units by mouth daily.    . clopidogrel (PLAVIX) 75 MG tablet Take 1 tablet (75 mg total) by mouth daily with breakfast. 90 tablet 3  . Global Inject Ease Lancets 30G MISC USE FOUR TIMES DAILY 100 each 0  . insulin detemir (LEVEMIR FLEXTOUCH) 100 UNIT/ML FlexPen Inject 25 Units into the skin at bedtime. 15 mL 3  . Insulin Pen Needle 32G X 4 MM MISC 1 box of 100 each 1 each 2  . isosorbide mononitrate (IMDUR) 30 MG 24 hr tablet TAKE 1 TABLET BY MOUTH EVERY DAY 90 tablet 1  . lisinopril-hydrochlorothiazide (PRINZIDE,ZESTORETIC) 10-12.5 MG per tablet Take 1 tablet by mouth daily.    . metFORMIN (GLUCOPHAGE-XR) 500 MG 24 hr tablet Take 500 mg by mouth 2 (two) times daily.    . metoprolol succinate (TOPROL-XL) 50 MG 24 hr tablet TAKE 1 TABLET BY MOUTH DAILY with OR immediately following a meal 30 tablet 6  . Multiple Vitamin (MULTIVITAMIN WITH MINERALS) TABS tablet Take 1 tablet by mouth daily.    . nitroGLYCERIN (NITROSTAT) 0.4 MG SL tablet Place 0.4 mg under the tongue every 5 (five) minutes as needed for chest pain.    . rosuvastatin (CRESTOR) 20 MG tablet Take 1 tablet (20 mg total) by mouth daily. 90 tablet 3  . Semaglutide,0.25 or 0.5MG /DOS, (OZEMPIC, 0.25 OR 0.5 MG/DOSE,) 2 MG/1.5ML SOPN Inject 0.5 mg into the skin once a week.     . traMADol (ULTRAM) 50 MG tablet Take 1 tablet (50 mg total) by mouth every 4 (four) hours as needed for moderate pain. 30 tablet 0   No current facility-administered medications for this visit.   Allergies:  Patient has no known allergies.   Social History: The patient  reports that he quit smoking about 9 months ago. His  smoking use included cigarettes. He has quit using smokeless tobacco. He reports current alcohol use. He reports that he does not use drugs.   Family History: The patient's family history includes Diabetes in his father; Heart disease in his father; Heart failure in his father; High blood pressure in his mother.   ROS:  Please see the history of present illness. Otherwise, complete review of systems is positive for {NONE DEFAULTED:18576::"none"}.  All other systems are reviewed and negative.   Physical Exam: VS:  There were no vitals taken for this visit., BMI There is no height or weight on file to calculate BMI.  Wt Readings from Last 3 Encounters:  12/11/20 238 lb 6.4 oz (108.1 kg)  11/10/20 225 lb (102.1 kg)  11/06/20 226 lb (102.5 kg)    General: Patient appears comfortable at rest. HEENT: Conjunctiva and lids normal, oropharynx clear with moist mucosa. Neck: Supple, no elevated JVP or carotid bruits, no thyromegaly. Lungs: Clear to auscultation, nonlabored breathing at rest. Cardiac: Regular rate and  rhythm, no S3 or significant systolic murmur, no pericardial rub. Abdomen: Soft, nontender, no hepatomegaly, bowel sounds present, no guarding or rebound. Extremities: No pitting edema, distal pulses 2+. Skin: Warm and dry. Musculoskeletal: No kyphosis. Neuropsychiatric: Alert and oriented x3, affect grossly appropriate.  ECG:  An ECG dated 10/10/2020 was personally reviewed today and demonstrated:  Normal sinus rhythm with nonspecific T wave changes, rightward axis.  Recent Labwork: 09/20/2020: ALT 19; AST 17 09/21/2020: TSH 4.363 09/24/2020: Magnesium 2.3 09/26/2020: Hemoglobin 11.1; Platelets 112 09/28/2020: BUN 12; Creatinine, Ser 0.87; Potassium 3.8; Sodium 136     Component Value Date/Time   CHOL 101 09/20/2020 0244   TRIG 191 (H) 09/20/2020 0244   HDL 24 (L) 09/20/2020 0244   CHOLHDL 4.2 09/20/2020 0244   VLDL 38 09/20/2020 0244   LDLCALC 39 09/20/2020 0244    Other  Studies Reviewed Today:  Cardiac catheterization 09/19/2020:  Previously placed Prox LAD drug eluting stent is widely patent.  Balloon angioplasty was performed.  Prox RCA to Mid RCA lesion is 25% stenosed.  Dist RCA lesion is 10% stenosed.  Non-stenotic Prox RCA lesion was previously treated.  Mid RCA lesion is 99% stenosed.  Mid LAD lesion is 70% stenosed with 0% stenosed side branch in 2nd Diag.   1. The LAD is a large caliber vessel that courses to the apex. The proximal and mid vessel has a stented segment. There is a severe restenosis in the mid LAD stented segment in the stent that was placed in may 2021. (DFR=0.85).  2. The Circumflex is small with diffuse plaque 3. The RCA is a large dominant artery with stents from the ostium down through the distal vessel. There is severe restenosis in the mid stented segment. This area has 2 layers of stent and was treated in May 2021 with cutting balloon angioplasty.   Recommendations: He has progression of disease in the stented segments of the LAD and RCA. The LAD stent was placed in may 2021. The mid RCA stented segment has 2 layers of stent and was treated with cutting balloon angioplasty in May 2021. The LAD restenosis is significant by pressure wire analysis. I have reviewed his case with Dr. [place] and we both agree that his best option will be CABG.   Echocardiogram 09/20/2020: 1. Left ventricular ejection fraction, by estimation, is 50 to 55%. The  left ventricle has low normal function. Difficult to assess wall motion  due to incomplete visualization of LV myocardium. Based on limited views,  all LV segments appear to have low  normal contractility. There is mild concentric left ventricular  hypertrophy. Left ventricular diastolic parameters are consistent with  Grade I diastolic dysfunction (impaired relaxation).  2. Right ventricular systolic function is mildly reduced. The right  ventricular size is normal.  3. The  mitral valve is normal in structure. Trivial mitral valve  regurgitation.  4. The inferior vena cava is normal in size with greater than 50%  respiratory variability, suggesting right atrial pressure of 3 mmHg.  5. The aortic valve is grossly normal. There is mild thickening of the  aortic valve. Aortic valve regurgitation is not visualized.   Assessment and Plan:    Medication Adjustments/Labs and Tests Ordered: Current medicines are reviewed at length with the patient today.  Concerns regarding medicines are outlined above.   Tests Ordered: No orders of the defined types were placed in this encounter.   Medication Changes: No orders of the defined types were placed in this encounter.  Disposition:  Follow up {follow up:15908}  Signed, Satira Sark, MD, [facility] 01/21/2021 12:40 PM    [facility], [place], [place] [address] Phone: [phone]; Fax: [phone]

## 2021-01-22 ENCOUNTER — Ambulatory Visit: Payer: Self-pay | Admitting: Cardiology

## 2021-01-22 DIAGNOSIS — I25119 Atherosclerotic heart disease of native coronary artery with unspecified angina pectoris: Secondary | ICD-10-CM

## 2021-01-22 DIAGNOSIS — I4891 Unspecified atrial fibrillation: Secondary | ICD-10-CM

## 2021-02-25 NOTE — Progress Notes (Signed)
Cardiology Office Note  Date: 02/26/2021   ID: [name] Gray, DOB [birth year], MRN [id number]  PCP:  [facility]  Cardiologist:  Rozann Lesches, MD Electrophysiologist:  None   Chief Complaint: Follow-up CAD, HLD, DM2, HTN, tobacco abuse,   History of Present Illness: [name] Gray is a 65 y.o. male with a history of CAD status post coronary artery bypass x3 on 11/2/202 with postop atrial fibrillation,1 DM 2, HLD, HTN, OSA, PVD.  Last seen by Dr. Bronson Ing 04/16/2020 after undergoing a DES to proximal RCA for 80% stenosis.  Also had mid to distal 80% RCA stenosis undergoing scoring balloon angioplasty.  This was performed on 04/03/2020.  On 04/05/2019 underwent atherectomy for mid LAD 80% stenosis followed by DES placement.  There was proximal LAD 70% stenosis treated with atherectomy at high speed followed by DES placement overlapping previous placed stent.  It was recommended the patient continue on uninterrupted DAPT therapy with aspirin and Plavix.  He admitted to doing better and had taken his Imdur for about 2 days after discharge and then discontinued it.  Had not used any sublingual nitroglycerin.  Not smoked for 2 weeks since discharge.  He was using nicotine patches.  He presented to [facility] emergency department on 09/19/2020 reporting history of 2-week episodes of on and off chest discomfort.  Initially thought was more due to acid reflux.  Over the days prior to presentation described as central chest pressure radiating to left arm and shoulder.  He stated he had associated shortness of breath which he described as exertional.  Described the intensity of the pain as 8 or 9 out of 10.  Took 3 sublingual nitroglycerin with relief but pain eventually returned.  He described the pain as being similar to before when he had stent placement.  He described his diabetes as been poorly controlled.  Initial troponins were elevated but not significantly rising.  Cardiology was  consulted.  He eventually had three-vessel bypass with LIMA to distal LAD, pedicled RIMA graft to PDA, left radial artery graft to first diagonal, open left radial artery harvesting, bilateral IMA harvesting.  He developed postop atrial fibrillation but eventually converted to normal sinus rhythm.  He was placed on amiodarone.  He was last here post CABG. Had recently seen Dr. Julien Girt and was having staples removed from his chest, and had some wound dehiscence.  Sutures were placed in his office.  Antibiotics were prescribed.  Wound looked slightly red with good wound approximation with some small amount of yellow exudate in the incision.  Patient stated he had not started the antibiotic yet and did not want to.  Advised him he needed to take antibiotics to avoid any type of infection that may invade the sternum and cause osteomyelitis.  His left radial harvesting site looked good.   He is here for 78-monthfollow-up.  He denies any issues other than some continuing puslike wound drainage from median sternotomy site.  He describes an unusual feeling around his sternal incision.  Does not describe pain.  He states he has been attempting to "drain" the site daily.  States he usually presses on the area at the lower part of the incision which produces puslike drainage.  States he usually notices puslike drainage on a daily basis.  His left radial harvesting site looks good.  Otherwise he denies any anginal or exertional symptoms.  His blood pressure is elevated today but states he only took his blood pressure medications about 30  minutes prior to arrival.  He has not stopped smoking.  States he is not checking his sugars as he should.  We spoke about following up with CT surgeon Dr. Julien Girt for discomfort in sternal area along with puslike drainage.  He agrees he should go back to see Dr. Julien Girt.   Past Medical History:  Diagnosis Date  . CAD (coronary artery disease)    Multivessel status post previous stent  interventions and ultimately CABG in November 2021  . Essential hypertension   . Hyperlipidemia   . OSA (obstructive sleep apnea)   . Postoperative atrial fibrillation ([place])   . Type 2 diabetes mellitus ([place])     Past Surgical History:  Procedure Laterality Date  . CORONARY ARTERY BYPASS GRAFT N/A 09/23/2020   Procedure: CORONARY ARTERY BYPASS GRAFTING (CABG), ON PUMP, TIMES THREE, USING BILATERAL INTERNAL MAMMARY ARTERIES AND LEFT RADIAL ARTERY;  Surgeon: Wonda Olds, MD;  Location: [place];  Service: Open Heart Surgery;  Laterality: N/A;  . CORONARY ATHERECTOMY N/A 04/04/2020   Procedure: CORONARY ATHERECTOMY;  Surgeon: Jettie Booze, MD;  Location: [place] [facility];  Service: Cardiovascular;  Laterality: N/A;  . CORONARY STENT INTERVENTION N/A 04/03/2020   Procedure: CORONARY STENT INTERVENTION;  Surgeon: Jettie Booze, MD;  Location: [place] [facility];  Service: Cardiovascular;  Laterality: N/A;  . INTRAVASCULAR PRESSURE WIRE/FFR STUDY N/A 09/19/2020   Procedure: INTRAVASCULAR PRESSURE WIRE/FFR STUDY;  Surgeon: Burnell Blanks, MD;  Location: [place] [facility];  Service: Cardiovascular;  Laterality: N/A;  . INTRAVASCULAR ULTRASOUND/IVUS N/A 04/03/2020   Procedure: Intravascular Ultrasound/IVUS;  Surgeon: Jettie Booze, MD;  Location: [place] [facility];  Service: Cardiovascular;  Laterality: N/A;  . INTRAVASCULAR ULTRASOUND/IVUS N/A 04/04/2020   Procedure: Intravascular Ultrasound/IVUS;  Surgeon: Jettie Booze, MD;  Location: [place] [facility];  Service: Cardiovascular;  Laterality: N/A;  . LEFT HEART CATH AND CORONARY ANGIOGRAPHY N/A 04/03/2020   Procedure: LEFT HEART CATH AND CORONARY ANGIOGRAPHY;  Surgeon: Jettie Booze, MD;  Location: [place] [facility];  Service: Cardiovascular;  Laterality: N/A;  . LEFT HEART CATH AND CORONARY ANGIOGRAPHY N/A 04/04/2020   Procedure: LEFT HEART CATH AND CORONARY ANGIOGRAPHY;  Surgeon: Jettie Booze, MD;  Location: [place] [facility];  Service: Cardiovascular;  Laterality: N/A;  . LEFT HEART CATH AND CORONARY ANGIOGRAPHY N/A 09/19/2020   Procedure: LEFT HEART CATH AND CORONARY ANGIOGRAPHY;  Surgeon: Burnell Blanks, MD;  Location: [place] [facility];  Service: Cardiovascular;  Laterality: N/A;  . RADIAL ARTERY HARVEST Left 09/23/2020   Procedure: RADIAL ARTERY HARVEST;  Surgeon: Wonda Olds, MD;  Location: [place];  Service: Open Heart Surgery;  Laterality: Left;  . TEE WITHOUT CARDIOVERSION N/A 09/23/2020   Procedure: TRANSESOPHAGEAL ECHOCARDIOGRAM (TEE);  Surgeon: Wonda Olds, MD;  Location: [place];  Service: Open Heart Surgery;  Laterality: N/A;    Current Outpatient Medications  Medication Sig Dispense Refill  . acetaminophen (TYLENOL) 500 MG tablet Take 2 tablets (1,000 mg total) by mouth every 6 (six) hours as needed. 30 tablet 0  . aspirin EC 81 MG tablet Take 81 mg by mouth daily.    . Blood Glucose Monitoring Suppl (TRUE METRIX AIR GLUCOSE METER) w/Device KIT USE AS DIRECTED 1 kit 0  . clopidogrel (PLAVIX) 75 MG tablet Take 1 tablet (75 mg total) by mouth daily with breakfast. 90 tablet 3  . Global Inject Ease Lancets 30G MISC USE FOUR TIMES DAILY 100 each 0  .  insulin detemir (LEVEMIR FLEXTOUCH) 100 UNIT/ML FlexPen Inject 25 Units into the skin at bedtime. 15 mL 3  . Insulin Pen Needle 32G X 4 MM MISC 1 box of 100 each 1 each 2  . lisinopril-hydrochlorothiazide (PRINZIDE,ZESTORETIC) 10-12.5 MG per tablet Take 1 tablet by mouth daily.    . metFORMIN (GLUCOPHAGE-XR) 500 MG 24 hr tablet Take 500 mg by mouth 2 (two) times daily.    . metoprolol succinate (TOPROL-XL) 50 MG 24 hr tablet TAKE 1 TABLET BY MOUTH DAILY with OR immediately following a meal 30 tablet 6  . Multiple Vitamin (MULTIVITAMIN WITH MINERALS) TABS tablet Take 1 tablet by mouth daily.    . rosuvastatin (CRESTOR) 20 MG tablet Take 1 tablet (20 mg total) by mouth daily. 90 tablet 3  . Semaglutide,0.25  or 0.5MG/DOS, (OZEMPIC, 0.25 OR 0.5 MG/DOSE,) 2 MG/1.5ML SOPN Inject 0.5 mg into the skin once a week.      No current facility-administered medications for this visit.   Allergies:  Patient has no known allergies.   Social History: The patient  reports that he quit smoking about 10 months ago. His smoking use included cigarettes. He has quit using smokeless tobacco. He reports current alcohol use. He reports that he does not use drugs.   Family History: The patient's family history includes Diabetes in his father; Heart disease in his father; Heart failure in his father; High blood pressure in his mother.   ROS:  Please see the history of present illness. Otherwise, complete review of systems is positive for none.  All other systems are reviewed and negative.   Physical Exam: VS:  BP (!) 158/92   Pulse 85   Ht 5' 10" (1.778 m)   Wt 235 lb 6.4 oz (106.8 kg)   SpO2 96%   BMI 33.78 kg/m , BMI Body mass index is 33.78 kg/m.  Wt Readings from Last 3 Encounters:  02/26/21 235 lb 6.4 oz (106.8 kg)  12/11/20 238 lb 6.4 oz (108.1 kg)  11/10/20 225 lb (102.1 kg)    General: Obese patient appears comfortable at rest. Neck: Supple, no elevated JVP or carotid bruits, no thyromegaly. Lungs: Clear to auscultation, nonlabored breathing at rest. Cardiac: Regular rate and rhythm, no S3 or significant systolic murmur, no pericardial rub. Extremities: No pitting edema, distal pulses 2+. Skin: Warm and dry.  Median sternotomy site with puslike drainage the lower part of the wound area.  Left radial artery graft site clean and dry with wound well approximated no evidence of infection or redness noted. Musculoskeletal: No kyphosis. Neuropsychiatric: Alert and oriented x3, affect grossly appropriate.  ECG:  EKG September 24, 2020: Sinus rhythm with first-degree AV block rate of 76, nonspecific T wave abnormality, prolonged QT with QT/QTc 422/474.  Recent Labwork: 09/20/2020: ALT 19; AST 17 09/21/2020:  TSH 4.363 09/24/2020: Magnesium 2.3 09/26/2020: Hemoglobin 11.1; Platelets 112 09/28/2020: BUN 12; Creatinine, Ser 0.87; Potassium 3.8; Sodium 136     Component Value Date/Time   CHOL 101 09/20/2020 0244   TRIG 191 (H) 09/20/2020 0244   HDL 24 (L) 09/20/2020 0244   CHOLHDL 4.2 09/20/2020 0244   VLDL 38 09/20/2020 0244   LDLCALC 39 09/20/2020 0244    Other Studies Reviewed Today:    INTRAVASCULAR PRESSURE WIRE/FFR STUDY  LEFT HEART CATH AND CORONARY ANGIOGRAPHY  Conclusion    Previously placed Prox LAD drug eluting stent is widely patent.  Balloon angioplasty was performed.  Prox RCA to Mid RCA lesion is 25% stenosed.  Dist  RCA lesion is 10% stenosed.  Non-stenotic Prox RCA lesion was previously treated.  Mid RCA lesion is 99% stenosed.  Mid LAD lesion is 70% stenosed with 0% stenosed side branch in 2nd Diag.   1. The LAD is a large caliber vessel that courses to the apex. The proximal and mid vessel has a stented segment. There is a severe restenosis in the mid LAD stented segment in the stent that was placed in may 2021. (DFR=0.85).  2. The Circumflex is small with diffuse plaque 3. The RCA is a large dominant artery with stents from the ostium down through the distal vessel. There is severe restenosis in the mid stented segment. This area has 2 layers of stent and was treated in May 2021 with cutting balloon angioplasty.   Recommendations: He has progression of disease in the stented segments of the LAD and RCA. The LAD stent was placed in may 2021. The mid RCA stented segment has 2 layers of stent and was treated with cutting balloon angioplasty in May 2021. The LAD restenosis is significant by pressure wire analysis. I have reviewed his case with Dr. Martinique and we both agree that his best option will be CABG.  He will be admitted to telemetry. He is pain free. Will arrange an echo. Will call CT surgery consult for CABG. Will stop Plavix. Continue ASA. Begin IV heparin 8  hours post sheath pull.   Diagnostic Dominance: Right      Echocardiogram 09/20/2020 1. Left ventricular ejection fraction, by estimation, is 50 to 55%. The left ventricle has low normal function. Difficult to assess wall motion due to incomplete visualization of LV myocardium. Based on limited views, all LV segments appear to have low normal contractility. There is mild concentric left ventricular hypertrophy. Left ventricular diastolic parameters are consistent with Grade I diastolic dysfunction (impaired relaxation). 2. Right ventricular systolic function is mildly reduced. The right ventricular size is normal. 3. The mitral valve is normal in structure. Trivial mitral valve regurgitation. 4. The inferior vena cava is normal in size with greater than 50% respiratory variability, suggesting right atrial pressure of 3 mmHg. 5. The aortic valve is grossly normal   Carotid artery duplex study pre-CABG 09/20/2020 Summary:  Right Carotid: Velocities in the right ICA are consistent with a 40-59%         stenosis.   Left Carotid: Velocities in the left ICA are consistent with a 1-39%  stenosis.  Vertebrals: Bilateral vertebral arteries demonstrate antegrade flow.  Subclavians: Normal flow hemodynamics were seen in bilateral subclavian        arteri    Cath 04/03/2020  Prox RCA lesion is 80% stenosed. A drug-eluting stent was successfully placed using a SYNERGY XD 3.50X20, postdilated to 3.8 mm. Stent size was optimized with IVUS.  Post intervention, there is a 0% residual stenosis.  Mid RCA to Dist RCA lesion is 80% stenosed. THis was in stent restenosis noted by IVUS. Scoring balloon angioplasty was performed using a BALLOON WOLVERINE 3.50X10.  Post intervention, there is a 20% residual stenosis.  Dist RCA lesion is 10% stenosed.  Mid LAD lesion is 80% stenosed. Long, heavily calcified lesion.  The left ventricular systolic function is normal.  LV end  diastolic pressure is normal.  The left ventricular ejection fraction is 55-65% by visual estimate.  There is no aortic valve stenosis.  A drug-eluting stent was successfully placed using a SYNERGY XD 3.50X20.  Will plan to bring the patient back for orbital atherectomy of  the LAD, tomorrow.  Continue DAPT with aggressive secondary prevention.  I conveyed the results and plans for orbital atherectomy, to his wife [name] 04/04/2020  Prox RCA to Mid RCA lesion is 25% stenosed.  Dist RCA lesion is 10% stenosed.  Mid RCA to Dist RCA lesion is 20% stenosed.  Previously placed Prox RCA drug eluting stent is widely patent. PTCA site is widely patent.  Mid LAD lesion is 80% stenosed. This was treated with atherectomy. A drug-eluting stent was successfully placed using a SYNERGY XD 3.0X38, postdilated to 3.75 mm.  Post intervention, there is a 0% residual stenosis.  Prox LAD lesion is 70% stenosed. This was treated with atherectomy at high speed. A drug-eluting stent was successfully placed overlapping the above stent, using a SYNERGY XD 3.50X16, postdilated to 3.8 mm.  Post intervention, there is a 0% residual stenosis.  LV end diastolic pressure is normal.  There is no aortic valve stenosis.  Slow flow in jailed diagonal vessel, but improving with time.  Successful PCI of LAD. Transient diagonal occlusion with stent placement. Will see how his chest pain does. Post procedure ECG showed some subtle lateral ST changes likely related to diagonal stenosis.   Information conveyed to his wife.  Aggressive secondary prevention including smoking cessation.   Will start IV NTG to help with BP and help improve flow in diagonal vessel.  Assessment and Plan:  1. CAD in native artery   2. Essential hypertension, benign   3. Hyperlipidemia LDL goal <70   4. DM (diabetes mellitus) type II uncontrolled, periph vascular disorder ([place])   5. Tobacco abuse   6. Atrial  fibrillation, unspecified type ([place])    1. CAD in native artery/status post CABG x3 Status post three-vessel bypass with LIMA-LAD, pedicle or may to PDA, left radial artery to first diagonal, left radial artery harvesting prolonged bilateral IMA harvesting.  Continue aspirin 81 mg daily.  Plavix 75 mg daily.  Toprol-XL 50 mg daily.  Nitroglycerin 0.4 mg as needed.  Had some wound dehiscence after staples removed at [place] surgeons office.  [facility] surgeon placed sutures and gave patient antibiotics prophylactically to prevent infection.  Patient has not started antibiotics yet and voiced that he really did not want to start the medication.  At last visit highly advised patient to take the antibiotics to prevent possible infection which may invade the sternum causing osteomyelitis.. At today's visit he states he took all of the antibiotic he was prescribed.  He complains of continued puslike drainage from the bottom of his median sternotomy wound and feels some mild discomfort in the area.  Advised him we would send him back to Dr. Julien Girt to evaluate.  In the meantime we will start Keflex 500 mg p.o. 4 times daily x 10 days.  He verbalizes understanding.   2. Essential hypertension, benign Blood pressure elevated today but he states he only took his medications 30 minutes prior to arriving.  Denies any dizziness, shortness of breath, activity intolerance.  Continue lisinopril HCTZ 10/12.5 mg p.o. daily.  Continue Toprol-XL 50 mg p.o. daily  3. Hyperlipidemia LDL goal <70 Continue Crestor 20 mg p.o. daily.  Recent lipid panel TC 101, TG 191, HDL 24, LDL 39.  4. Type 2 diabetes mellitus without complication, unspecified whether long term insulin use ([place]) Patient states he is not checking his blood sugar as he should and has no idea what his recent blood sugars have been.  He admits to not being compliant with  checking his sugars.  Stressed importance of maintaining good sugar levels.   5. Tobacco abuse At  last visit he had not smoked since surgery.  Advised him not to start back.  Apparently he has started back smoking.  He mentions smoking during our conversation.  Advised cessation.  6.  Postop atrial fibrillation Patient went into atrial fibrillation postop but resolved.  He continues on amiodarone 200 mg p.o. twice daily.  EKG today shows normal sinus rhythm rate of 77.  Denies any recent palpitations or arrhythmias.  Medication Adjustments/Labs and Tests Ordered: Current medicines are reviewed at length with the patient today.  Concerns regarding medicines are outlined above.   Disposition: Follow-up with Dr. Domenic Polite or APP 6 months Signed, Levell July, NP 02/26/2021 9:27 AM     [facility] [address], [place], [place] [address] Phone: [phone]; Fax: [phone]

## 2021-02-26 ENCOUNTER — Ambulatory Visit (INDEPENDENT_AMBULATORY_CARE_PROVIDER_SITE_OTHER): Payer: Self-pay | Admitting: Family Medicine

## 2021-02-26 ENCOUNTER — Encounter: Payer: Self-pay | Admitting: Family Medicine

## 2021-02-26 ENCOUNTER — Other Ambulatory Visit: Payer: Self-pay

## 2021-02-26 VITALS — BP 158/92 | HR 85 | Ht 70.0 in | Wt 235.4 lb

## 2021-02-26 DIAGNOSIS — Z720 Tobacco use: Secondary | ICD-10-CM

## 2021-02-26 DIAGNOSIS — E1165 Type 2 diabetes mellitus with hyperglycemia: Secondary | ICD-10-CM

## 2021-02-26 DIAGNOSIS — E1151 Type 2 diabetes mellitus with diabetic peripheral angiopathy without gangrene: Secondary | ICD-10-CM

## 2021-02-26 DIAGNOSIS — I10 Essential (primary) hypertension: Secondary | ICD-10-CM

## 2021-02-26 DIAGNOSIS — I4891 Unspecified atrial fibrillation: Secondary | ICD-10-CM

## 2021-02-26 DIAGNOSIS — I2510 Atherosclerotic heart disease of native coronary artery without angina pectoris: Secondary | ICD-10-CM

## 2021-02-26 DIAGNOSIS — IMO0002 Reserved for concepts with insufficient information to code with codable children: Secondary | ICD-10-CM

## 2021-02-26 DIAGNOSIS — E785 Hyperlipidemia, unspecified: Secondary | ICD-10-CM

## 2021-02-26 MED ORDER — CEPHALEXIN 500 MG PO CAPS: 500.0000 mg | ORAL_CAPSULE | Freq: Four times a day (QID) | ORAL | 0 refills | Status: DC

## 2021-02-26 NOTE — Patient Instructions (Addendum)
Medication Instructions:   Begin Keflex 500mg  4 x day x 10 days.   Continue all other medications.    Labwork: none  Testing/Procedures: none  Follow-Up: 6 months   Any Other Special Instructions Will Be Listed Below (If Applicable).  If you need a refill on your cardiac medications before your next appointment, please call your pharmacy.

## 2021-02-27 ENCOUNTER — Other Ambulatory Visit (HOSPITAL_COMMUNITY): Payer: Self-pay | Admitting: Family Medicine

## 2021-02-27 ENCOUNTER — Other Ambulatory Visit: Payer: Self-pay | Admitting: Family Medicine

## 2021-02-27 DIAGNOSIS — F17219 Nicotine dependence, cigarettes, with unspecified nicotine-induced disorders: Secondary | ICD-10-CM

## 2021-03-09 ENCOUNTER — Telehealth: Payer: Self-pay

## 2021-03-09 ENCOUNTER — Telehealth: Payer: Self-pay | Admitting: Family Medicine

## 2021-03-09 NOTE — Telephone Encounter (Signed)
-----   Message from Linden Dolin, MD sent at 03/09/2021  2:05 PM EDT ----- Regarding: RE: draining wound Thurs is fine and ok to let Keflex run out ----- Message ----- From: Steve Rattler, RN Sent: 03/09/2021  12:18 PM EDT To: Linden Dolin, MD Subject: draining wound                                 Hey,  Patient was seen by Cardiology in [place] today.  He has had drainage from the lower part of his sternal incision.  Was placed on Keflex for the drainage, helped some, but it is still draining.  Patient stated that he was cleaning and the incision "burst" open with odorous brown-yellow pus.  He said it was "quite a bit".  I have moved his appointment up to this Thursday. Do you want to continue his Keflex? He is to run out tomorrow. Also, would you like to see him sooner than Thursday?  Thanks,  Morrie Sheldon

## 2021-03-09 NOTE — Telephone Encounter (Signed)
Spoke with [name] Gray at [facility] (Dr. Renaldo Fiddler) - she will move up his appointment to Thursday, 03/12/21 at 9:45 am.  He will need to get chest x-ray at 9:00 am at [facility] prior to that appointment.    Attempted to reach patient (home & mobile # are same) - voice mail not set up.    Attempted to reach on work number - fast busy signal.

## 2021-03-09 NOTE — Telephone Encounter (Signed)
Patient made aware of keeping appointment and to continue Keflex until gone per Dr. Vickey Sages.

## 2021-03-09 NOTE — Telephone Encounter (Signed)
Patient called stating that his wound continues to ooze and that he is out of his antibiotic .Requested to see if he could speak with Verita Schneiders, NP

## 2021-03-11 NOTE — Telephone Encounter (Signed)
Spoke to patient who was made aware of appointment change by Dr. Garlan Fillers office. Patient was also made aware that he is to have a chest x-ray at [facility] prior to his appointment that morning. Patient had no questions or concerns at this time.

## 2021-03-12 ENCOUNTER — Other Ambulatory Visit: Payer: Self-pay

## 2021-03-12 ENCOUNTER — Ambulatory Visit: Payer: Self-pay | Admitting: Cardiothoracic Surgery

## 2021-03-12 VITALS — BP 140/87 | HR 88 | Temp 97.6°F | Resp 20 | Ht 70.0 in | Wt 234.0 lb

## 2021-03-12 DIAGNOSIS — Z951 Presence of aortocoronary bypass graft: Secondary | ICD-10-CM

## 2021-03-12 DIAGNOSIS — Z5189 Encounter for other specified aftercare: Secondary | ICD-10-CM

## 2021-03-12 DIAGNOSIS — I2510 Atherosclerotic heart disease of native coronary artery without angina pectoris: Secondary | ICD-10-CM

## 2021-03-12 NOTE — Progress Notes (Signed)
The patient returns for wound check.  He is status post CABG in the fall.  He is a smoker and diabetic and has had difficulty with superficial stitch abscesses after surgery.  The most recent one erupted apparently in the past couple of weeks.  He denies fevers or chills.  His sternum has been stable without any clicking or pain related to the bone  Physical exam: BP 140/87   Pulse 88   Temp 97.6 F (36.4 C) (Skin)   Resp 20   Ht 5\' 10"  (1.778 m)   Wt 106.1 kg   SpO2 94% Comment: RA  BMI 33.58 kg/m  Well-appearing no acute distress There is a small defect in the lower third of the sternum that does not track beyond a centimeter deep and superiorly.  There is no significant drainage or granulation tissue.   The patient is instructed to pack the wound with iodoform gauze twice daily.  In between packings he may shower.  He is encouraged to shave the chest to make the dressing changes easier.  He is to report fevers or chills and return in 2 weeks for wound check.  Lebert Lovern Z. , MD [phone]

## 2021-03-19 ENCOUNTER — Ambulatory Visit: Payer: Self-pay | Admitting: Cardiothoracic Surgery

## 2021-03-21 ENCOUNTER — Other Ambulatory Visit: Payer: Self-pay | Admitting: Family Medicine

## 2021-03-21 ENCOUNTER — Other Ambulatory Visit: Payer: Self-pay | Admitting: Physician Assistant

## 2021-03-24 ENCOUNTER — Ambulatory Visit: Payer: 59 | Admitting: Cardiothoracic Surgery

## 2021-03-24 ENCOUNTER — Encounter: Payer: Self-pay | Admitting: Cardiothoracic Surgery

## 2021-03-24 ENCOUNTER — Other Ambulatory Visit: Payer: Self-pay

## 2021-03-24 VITALS — BP 150/85 | HR 74 | Temp 98.4°F | Resp 20 | Ht 70.0 in | Wt 233.0 lb

## 2021-03-24 DIAGNOSIS — I2510 Atherosclerotic heart disease of native coronary artery without angina pectoris: Secondary | ICD-10-CM

## 2021-03-24 DIAGNOSIS — Z951 Presence of aortocoronary bypass graft: Secondary | ICD-10-CM

## 2021-03-24 DIAGNOSIS — Z5189 Encounter for other specified aftercare: Secondary | ICD-10-CM

## 2021-03-24 NOTE — Progress Notes (Signed)
The patient returns for wound check.  He was being treated with home wound packings for a small stitch abscess.  This is cleared up.  He has no complaints.  Physical exam:  BP (!) 150/85 (BP Location: Left Arm, Patient Position: Sitting)   Pulse 74   Temp 98.4 F (36.9 C)   Resp 20   Ht 5\' 10"  (1.778 m)   Wt 105.7 kg   SpO2 94% Comment: RA  BMI 33.43 kg/m  Well-appearing no acute distress Sternal defect is epithelialized.  Mild surrounding erythema consistent with wound induration but not warmth or infection.  Assessment/plan:  Follow-up as needed  Dhana Totton Z. , MD [phone]

## 2021-03-26 ENCOUNTER — Ambulatory Visit: Payer: 59 | Admitting: Cardiothoracic Surgery

## 2021-03-27 ENCOUNTER — Encounter (HOSPITAL_COMMUNITY): Payer: Self-pay

## 2021-03-27 ENCOUNTER — Other Ambulatory Visit: Payer: Self-pay

## 2021-03-27 ENCOUNTER — Ambulatory Visit (HOSPITAL_COMMUNITY)
Admission: RE | Admit: 2021-03-27 | Discharge: 2021-03-27 | Disposition: A | Discharge status: Home / Self Care | Payer: 59 | Source: Ambulatory Visit | Attending: Family Medicine | Admitting: Family Medicine

## 2021-03-27 DIAGNOSIS — F17219 Nicotine dependence, cigarettes, with unspecified nicotine-induced disorders: Secondary | ICD-10-CM

## 2021-05-06 ENCOUNTER — Other Ambulatory Visit: Payer: Self-pay | Admitting: Physician Assistant

## 2021-05-07 NOTE — Telephone Encounter (Signed)
Not an anticoagulant or PCSK9i

## 2021-06-05 ENCOUNTER — Encounter: Payer: Self-pay | Admitting: Physician Assistant

## 2021-06-15 ENCOUNTER — Telehealth: Payer: Self-pay | Admitting: Family Medicine

## 2021-06-15 NOTE — Telephone Encounter (Signed)
Patient called stating that his incision from past CABG surgery has been bothering him for a couple of months.States that the incision has started draining and very sore.[name] Gray

## 2021-06-16 ENCOUNTER — Encounter: Payer: Self-pay | Admitting: Physician Assistant

## 2021-06-16 ENCOUNTER — Ambulatory Visit: Payer: 59 | Admitting: Physician Assistant

## 2021-06-16 ENCOUNTER — Other Ambulatory Visit: Payer: Self-pay | Admitting: *Deleted

## 2021-06-16 ENCOUNTER — Other Ambulatory Visit: Payer: Self-pay

## 2021-06-16 ENCOUNTER — Ambulatory Visit
Admission: RE | Admit: 2021-06-16 | Discharge: 2021-06-16 | Disposition: A | Discharge status: Home / Self Care | Payer: 59 | Source: Ambulatory Visit | Attending: Cardiothoracic Surgery | Admitting: Cardiothoracic Surgery

## 2021-06-16 ENCOUNTER — Other Ambulatory Visit: Payer: Self-pay | Admitting: Thoracic Surgery (Cardiothoracic Vascular Surgery)

## 2021-06-16 VITALS — BP 156/93 | HR 88 | Resp 20 | Ht 70.0 in | Wt 236.6 lb

## 2021-06-16 DIAGNOSIS — Z5189 Encounter for other specified aftercare: Secondary | ICD-10-CM

## 2021-06-16 DIAGNOSIS — Z951 Presence of aortocoronary bypass graft: Secondary | ICD-10-CM

## 2021-06-16 MED ORDER — CEPHALEXIN 500 MG PO CAPS: 500.0000 mg | ORAL_CAPSULE | Freq: Three times a day (TID) | ORAL | 0 refills | Status: AC

## 2021-06-16 NOTE — Telephone Encounter (Signed)
Reports CABG incision has opened up and has reports pain in area of CABG similar to what occurred before. Denies fever, chills, chest pain, n/v.  Advised that he needed to contact the surgeon office Vickey Sages) with this concern Verbalized understanding of plan.

## 2021-06-16 NOTE — Progress Notes (Signed)
Patient contacted the office stating he had a CABG by Dr. Vickey Sages in November of 2021 and has had multiple wound issues. He states two days ago another spot opened on his incision and has caused a great deal of pain. Patient states the day it opened it drained but has had minimal amount of drainage since. Patient states pain in the area is increased with movement or when stress is placed on the chest. Appointment scheduled for patient to be seen in clinic today. Order for chest xray placed. Patient aware and verbalizes understanding.

## 2021-06-16 NOTE — Progress Notes (Signed)
[address]       [place] [address]             [phone]       CARDIAC SURGERY POSTOPERATIVE VISIT  Patient Name: [name] Gray MRN: [id number] DOB: 29-Dec-1955  Subjective: [name] Gray is a 65 y.o. male who is s/p CABG x 3 by Dr. Orvan Seen on 09/23/2021. Patient presents today for further evaluation of his sternal wound. Patient states two days ago another area opened on his incision and has caused a great deal of pain. Patient states the day it opened it drained but has had minimal amount of drainage since. Patient states he has increased pain with movement or when stress is placed on the chest. He was last seen by Dr. Orvan Seen on 05/03 for a sternal stitch abscess. Of note, he has been given oral antibiotics on more than one occasion, which have helped. He denies chest pain, shortness of breath, or fever. He does have a cough, but is non productive.  Past Medical History:  Diagnosis Date   CAD (coronary artery disease)    Multivessel status post previous stent interventions and ultimately CABG in November 2021   Essential hypertension    Hyperlipidemia    OSA (obstructive sleep apnea)    Postoperative atrial fibrillation (HCC)    Type 2 diabetes mellitus ([place])    Prior to Admission medications   Medication Sig Start Date End Date Taking? Authorizing Provider  acetaminophen (TYLENOL) 500 MG tablet Take 2 tablets (1,000 mg total) by mouth every 6 (six) hours as needed. 09/28/20   Barrett, Lodema Hong, PA-C  aspirin EC 81 MG tablet Take 81 mg by mouth daily.    [provider]  Blood Glucose Monitoring Suppl (TRUE METRIX AIR GLUCOSE METER) w/Device KIT USE AS DIRECTED 05/02/20   [name], PA  cephALEXin (KEFLEX) 500 MG capsule Take 1 capsule (500 mg total) by mouth 4 (four) times daily. 02/26/21   Verta Ellen., NP  clopidogrel (PLAVIX) 75 MG tablet TAKE 1 TABLET BY MOUTH EVERY DAY WITH BREAKFAST 03/23/21   [name], PA  Global Inject Ease Lancets  30G MISC USE FOUR TIMES DAILY 05/02/20   [name], PA  insulin detemir (LEVEMIR FLEXTOUCH) 100 UNIT/ML FlexPen Inject 25 Units into the skin at bedtime. 04/05/20   [name], PA  Insulin Pen Needle 32G X 4 MM MISC 1 box of 100 each 04/05/20   [name], PA  lisinopril-hydrochlorothiazide (PRINZIDE,ZESTORETIC) 10-12.5 MG per tablet Take 1 tablet by mouth daily.    [provider]  metFORMIN (GLUCOPHAGE-XR) 500 MG 24 hr tablet Take 500 mg by mouth 2 (two) times daily. 03/19/20   [provider]  metoprolol succinate (TOPROL-XL) 50 MG 24 hr tablet TAKE 1 TABLET BY MOUTH DAILY with OR immediately following a meal 01/20/21   Verta Ellen., NP  Multiple Vitamin (MULTIVITAMIN WITH MINERALS) TABS tablet Take 1 tablet by mouth daily.    [provider]  rosuvastatin (CRESTOR) 20 MG tablet Take 1 tablet (20 mg total) by mouth daily. 07/03/20 10/10/21  [name], MD  Semaglutide,0.25 or 0.5MG /DOS, (OZEMPIC, 0.25 OR 0.5 MG/DOSE,) 2 MG/1.5ML SOPN Inject 0.5 mg into the skin once a week.     [provider]   Vitals:   06/16/21 1359  BP: (!) 156/93  Pulse: 88  Resp: 20  SpO2: 93%     Physical Exam:  CARDIOVASCULAR: Regular rate  and rhythm. No murmurs/rubs/gallops. No peripheral edema. RESPIRATORY: Respiratory effort is normal. Lungs clear to auscultation. WOUNDS: LUE wound is clean, dry, well healed. Mid sternal wound has a pinhole opening with slight purulent like drainage. Eschar present near wound. No sternal instability.  Imaging Studies:  CLINICAL DATA:  Sternal wound check common status post coronary bypass November 2020   EXAM: CHEST - 2 VIEW   COMPARISON:  10/06/2020   FINDINGS: Stable intact appearing sternotomy wires by plain radiography. Negative for edema or focal pneumonia. No effusion or pneumothorax. Trachea midline. Aorta atherosclerotic. Nonobstructive bowel gas pattern.   IMPRESSION: Stable postoperative findings.  No acute chest  process.   Aortic Atherosclerosis (ICD10-I70.0).     Electronically Signed   By: Jerilynn Mages.  Shick M.D.   On: 06/16/2021 14:04 Impression/Plan: Will send a prescription for Cephalexin to take tid until seen in the office next week. Patient instructed on the importance of smoking cessation. He is trying to quit but not successful yet. He states his "sugar" is well controlled compared to prior to surgery. We discussed the importance of strict glucose control and how it affects healing. Patient was instructed to wash wound with soap and water and apply band aid or dry gauze and tape. He is to change dressing daily and PRN. We will obtain a CT of the chest with contrast to help determine if there is a deeper infection present. Of note, CXR shows sternal wires are intact and not broken. Finally, patient stated he got a vaccine in his left arm last week. His left arm is still sore and he has a hard time lifting this arm up. He spoke with his medical doctor who told him to try heat and Tylenol. We discussed that this left arm problem is unrelated to his sternal issue and that he should call his medical doctor to be evaluated if the heat, Tylenol, etc do not help.   Lars Pinks, PA-C 06/16/2021 12:43 PM

## 2021-06-17 ENCOUNTER — Ambulatory Visit
Admission: RE | Admit: 2021-06-17 | Discharge: 2021-06-17 | Disposition: A | Discharge status: Home / Self Care | Payer: 59 | Source: Ambulatory Visit | Attending: Thoracic Surgery (Cardiothoracic Vascular Surgery) | Admitting: Thoracic Surgery (Cardiothoracic Vascular Surgery)

## 2021-06-17 DIAGNOSIS — Z951 Presence of aortocoronary bypass graft: Secondary | ICD-10-CM

## 2021-06-17 DIAGNOSIS — Z5189 Encounter for other specified aftercare: Secondary | ICD-10-CM

## 2021-06-17 MED ORDER — IOPAMIDOL (ISOVUE-300) INJECTION 61%
75.0000 mL | Freq: Once | INTRAVENOUS | Status: AC | PRN
  Administered 2021-06-17: 75 mL via INTRAVENOUS

## 2021-06-21 ENCOUNTER — Other Ambulatory Visit: Payer: Self-pay | Admitting: Family Medicine

## 2021-06-21 ENCOUNTER — Other Ambulatory Visit: Payer: Self-pay | Admitting: Cardiology

## 2021-06-26 ENCOUNTER — Ambulatory Visit: Payer: 59 | Admitting: Thoracic Surgery (Cardiothoracic Vascular Surgery)

## 2021-06-26 ENCOUNTER — Other Ambulatory Visit: Payer: Self-pay

## 2021-06-26 ENCOUNTER — Encounter: Payer: Self-pay | Admitting: Thoracic Surgery (Cardiothoracic Vascular Surgery)

## 2021-06-26 VITALS — BP 124/62 | HR 86 | Resp 20 | Ht 70.0 in | Wt 235.0 lb

## 2021-06-26 DIAGNOSIS — Z5189 Encounter for other specified aftercare: Secondary | ICD-10-CM

## 2021-06-26 DIAGNOSIS — Z951 Presence of aortocoronary bypass graft: Secondary | ICD-10-CM | POA: Diagnosis not present

## 2021-06-26 NOTE — Progress Notes (Signed)
      [address]       [place] [address]             [phone]        [name] Gray [facility] Medical Record #[id number] Date of Birth: 12-29-1955  Referring: [name] Bollman, MD Primary Care: [facility], [facility] Primary Cardiologist:None  Reason for visit:   follow-up  History of Present Illness:     65 year old male status post CABG in November 2021 presents on cross-sectional imaging to evaluate for sternal wound infection.  He was last seen by one of our physicians assistants and placed on oral antibiotics.  He currently has 1 day left in the course.  His pain is much improved and he denies any drainage.  Physical Exam: BP 124/62 (BP Location: Left Arm, Patient Position: Sitting)   Pulse 86   Resp 20   Ht 5\' 10"  (1.778 m)   Wt 235 lb (106.6 kg)   SpO2 92% Comment: RA  BMI 33.72 kg/m   Alert NAD Incision healing well.  No evidence of redness or drainage.  Sternum stable    Diagnostic Studies & Laboratory data: CT chest: No subcu or suprasternal fluid collection.  There is some inflammation in the soft tissue above the sternum.  The bony appears well-healed.    Assessment / Plan:   65 year old male status post CABG and November 2021 with use of bilateral internal mammary arteries.  He has had multiple bouts of drainage from his sternal incision and most recently which was treated with oral antibiotics and is currently completing his course.  I am concerned that he may have sternal wire infections that continue to exacerbate after stopping his oral antibiotics.  I instructed him to complete his course of antibiotics and to call December 2021 if he has any more drainage or pain.  He will likely require sternal wire removal.  We will see him back as a virtual visit in 1 month.   [place] 06/26/2021 4:41 PM

## 2021-07-24 ENCOUNTER — Other Ambulatory Visit: Payer: Self-pay

## 2021-07-24 ENCOUNTER — Telehealth (INDEPENDENT_AMBULATORY_CARE_PROVIDER_SITE_OTHER): Payer: 59 | Admitting: Thoracic Surgery (Cardiothoracic Vascular Surgery)

## 2021-07-24 DIAGNOSIS — Z951 Presence of aortocoronary bypass graft: Secondary | ICD-10-CM

## 2021-07-24 NOTE — Progress Notes (Signed)
     [address]       [place] [address]             [phone]       Patient: Home Provider: Office Consent for Telemedicine visit obtained.  Today's visit was completed via a real-time telehealth (see specific modality noted below). The patient/authorized person provided oral consent at the time of the visit to engage in a telemedicine encounter with the present provider at [facility]. The patient/authorized person was informed of the potential benefits, limitations, and risks of telemedicine. The patient/authorized person expressed understanding that the laws that protect confidentiality also apply to telemedicine. The patient/authorized person acknowledged understanding that telemedicine does not provide emergency services and that he or she would need to call 911 or proceed to the nearest hospital for help if such a need arose.   Total time spent in the clinical discussion 10 minutes.  Telehealth Modality: Phone visit (audio only)  I had a telephone visit with Mr. Bowley.  He is status post CABG back in November 2021 who has had a chronic draining tract from his sternal incision.  He was placed on antibiotics for several weeks but since being off the his wound has been stable.  It is no longer draining.  He does have some pain at the this area but it is getting better.  The CT scan that he had back in July showed some nonunion as well as potentially some draining pus but after undergoing wound care looks like this is healed well.  I informed him that if it opens up again or starts draining again we will need to take him back to surgery to remove wires.  Hopefully he will not need this in the future.  Patient will give [place] a call if there is any issues with the sternum.

## 2021-11-12 ENCOUNTER — Other Ambulatory Visit: Payer: Self-pay | Admitting: Thoracic Surgery (Cardiothoracic Vascular Surgery)

## 2021-11-12 DIAGNOSIS — Z951 Presence of aortocoronary bypass graft: Secondary | ICD-10-CM

## 2021-11-17 ENCOUNTER — Ambulatory Visit: Payer: 59

## 2021-12-11 ENCOUNTER — Other Ambulatory Visit: Payer: Self-pay

## 2021-12-11 ENCOUNTER — Ambulatory Visit
Admission: RE | Admit: 2021-12-11 | Discharge: 2021-12-11 | Disposition: A | Discharge status: Home / Self Care | Payer: BC Managed Care – PPO | Source: Ambulatory Visit | Attending: Thoracic Surgery (Cardiothoracic Vascular Surgery) | Admitting: Thoracic Surgery (Cardiothoracic Vascular Surgery)

## 2021-12-11 ENCOUNTER — Ambulatory Visit: Payer: 59 | Admitting: Thoracic Surgery (Cardiothoracic Vascular Surgery)

## 2021-12-11 VITALS — BP 145/85 | HR 86 | Resp 20 | Ht 70.0 in | Wt 245.0 lb

## 2021-12-11 DIAGNOSIS — I2510 Atherosclerotic heart disease of native coronary artery without angina pectoris: Secondary | ICD-10-CM | POA: Diagnosis not present

## 2021-12-11 DIAGNOSIS — R918 Other nonspecific abnormal finding of lung field: Secondary | ICD-10-CM | POA: Diagnosis not present

## 2021-12-11 DIAGNOSIS — Z951 Presence of aortocoronary bypass graft: Secondary | ICD-10-CM | POA: Diagnosis not present

## 2021-12-11 DIAGNOSIS — J439 Emphysema, unspecified: Secondary | ICD-10-CM | POA: Diagnosis not present

## 2021-12-11 DIAGNOSIS — L7682 Other postprocedural complications of skin and subcutaneous tissue: Secondary | ICD-10-CM

## 2021-12-11 DIAGNOSIS — I700 Atherosclerosis of aorta: Secondary | ICD-10-CM | POA: Diagnosis not present

## 2021-12-11 NOTE — Progress Notes (Signed)
° °   °  [address]       [place] [address]             [phone]        [name] Gray [facility] Medical Record #[id number] Date of Birth: [birth year]  Referring: Richardean Chimera, MD Primary Care: [facility] Primary Cardiologist:None  Reason for visit:   follow-up  History of Present Illness:     66 year old male was a former patient of Dr. Vickey Sages presents to discuss sternal pain.  He underwent a CABG with bilateral mammary harvest.  He admits to some sternal tenderness with movement of his upper extremities, but his main pain is at the inferior border of his incision.  Physical Exam: BP (!) 145/85 (BP Location: Right Arm, Patient Position: Sitting)    Pulse 86    Resp 20    Ht 5\' 10"  (1.778 m)    Wt 245 lb (111.1 kg)    SpO2 94% Comment: RA   BMI 35.15 kg/m   Alert NAD Incision well-healed..  Sternum stable.  There is point tenderness at the xiphoid Abdomen soft, ND    Diagnostic Studies & Laboratory data: CT chest: No evidence of sternal nonunion.  There is a small infra xiphoid hernia with a knuckle of fat.  No fluid collections.    Assessment / Plan:   67 year old male status post CABG in 2021 with chronic sternal pain.  I think the pain is related to the xiphoid hernia.  We discussed the possibility of undergoing a diaphragmatic hernia repair.  He did not want to pursue this option as of yet.  He will call 2022 if his pain gets any worse.   [place] 12/11/2021 4:06 PM

## 2022-01-11 DIAGNOSIS — I25119 Atherosclerotic heart disease of native coronary artery with unspecified angina pectoris: Secondary | ICD-10-CM | POA: Diagnosis not present

## 2022-01-11 DIAGNOSIS — F17210 Nicotine dependence, cigarettes, uncomplicated: Secondary | ICD-10-CM | POA: Diagnosis not present

## 2022-01-11 DIAGNOSIS — I10 Essential (primary) hypertension: Secondary | ICD-10-CM | POA: Diagnosis not present

## 2022-02-23 DIAGNOSIS — Z125 Encounter for screening for malignant neoplasm of prostate: Secondary | ICD-10-CM | POA: Diagnosis not present

## 2022-02-23 DIAGNOSIS — E1165 Type 2 diabetes mellitus with hyperglycemia: Secondary | ICD-10-CM | POA: Diagnosis not present

## 2022-02-23 DIAGNOSIS — Z1159 Encounter for screening for other viral diseases: Secondary | ICD-10-CM | POA: Diagnosis not present

## 2022-02-23 DIAGNOSIS — I10 Essential (primary) hypertension: Secondary | ICD-10-CM | POA: Diagnosis not present

## 2022-02-23 DIAGNOSIS — E7800 Pure hypercholesterolemia, unspecified: Secondary | ICD-10-CM | POA: Diagnosis not present

## 2022-02-23 DIAGNOSIS — E782 Mixed hyperlipidemia: Secondary | ICD-10-CM | POA: Diagnosis not present

## 2022-03-03 DIAGNOSIS — Z1212 Encounter for screening for malignant neoplasm of rectum: Secondary | ICD-10-CM | POA: Diagnosis not present

## 2022-03-03 DIAGNOSIS — I10 Essential (primary) hypertension: Secondary | ICD-10-CM | POA: Diagnosis not present

## 2022-03-03 DIAGNOSIS — E1151 Type 2 diabetes mellitus with diabetic peripheral angiopathy without gangrene: Secondary | ICD-10-CM | POA: Diagnosis not present

## 2022-03-03 DIAGNOSIS — E1165 Type 2 diabetes mellitus with hyperglycemia: Secondary | ICD-10-CM | POA: Diagnosis not present

## 2022-03-03 DIAGNOSIS — I25119 Atherosclerotic heart disease of native coronary artery with unspecified angina pectoris: Secondary | ICD-10-CM | POA: Diagnosis not present

## 2022-03-03 DIAGNOSIS — Z23 Encounter for immunization: Secondary | ICD-10-CM | POA: Diagnosis not present

## 2022-03-03 DIAGNOSIS — Z0001 Encounter for general adult medical examination with abnormal findings: Secondary | ICD-10-CM | POA: Diagnosis not present

## 2022-03-18 ENCOUNTER — Other Ambulatory Visit: Payer: Self-pay | Admitting: Physician Assistant

## 2022-05-27 IMAGING — DX DG CHEST 2V
2 series · 2 of 2 positions shown · non-contrast
Comparison: July 26, 2011

CLINICAL DATA: Chest pain and shortness of breath

EXAM:
CHEST - 2 VIEW

[w chest pa]
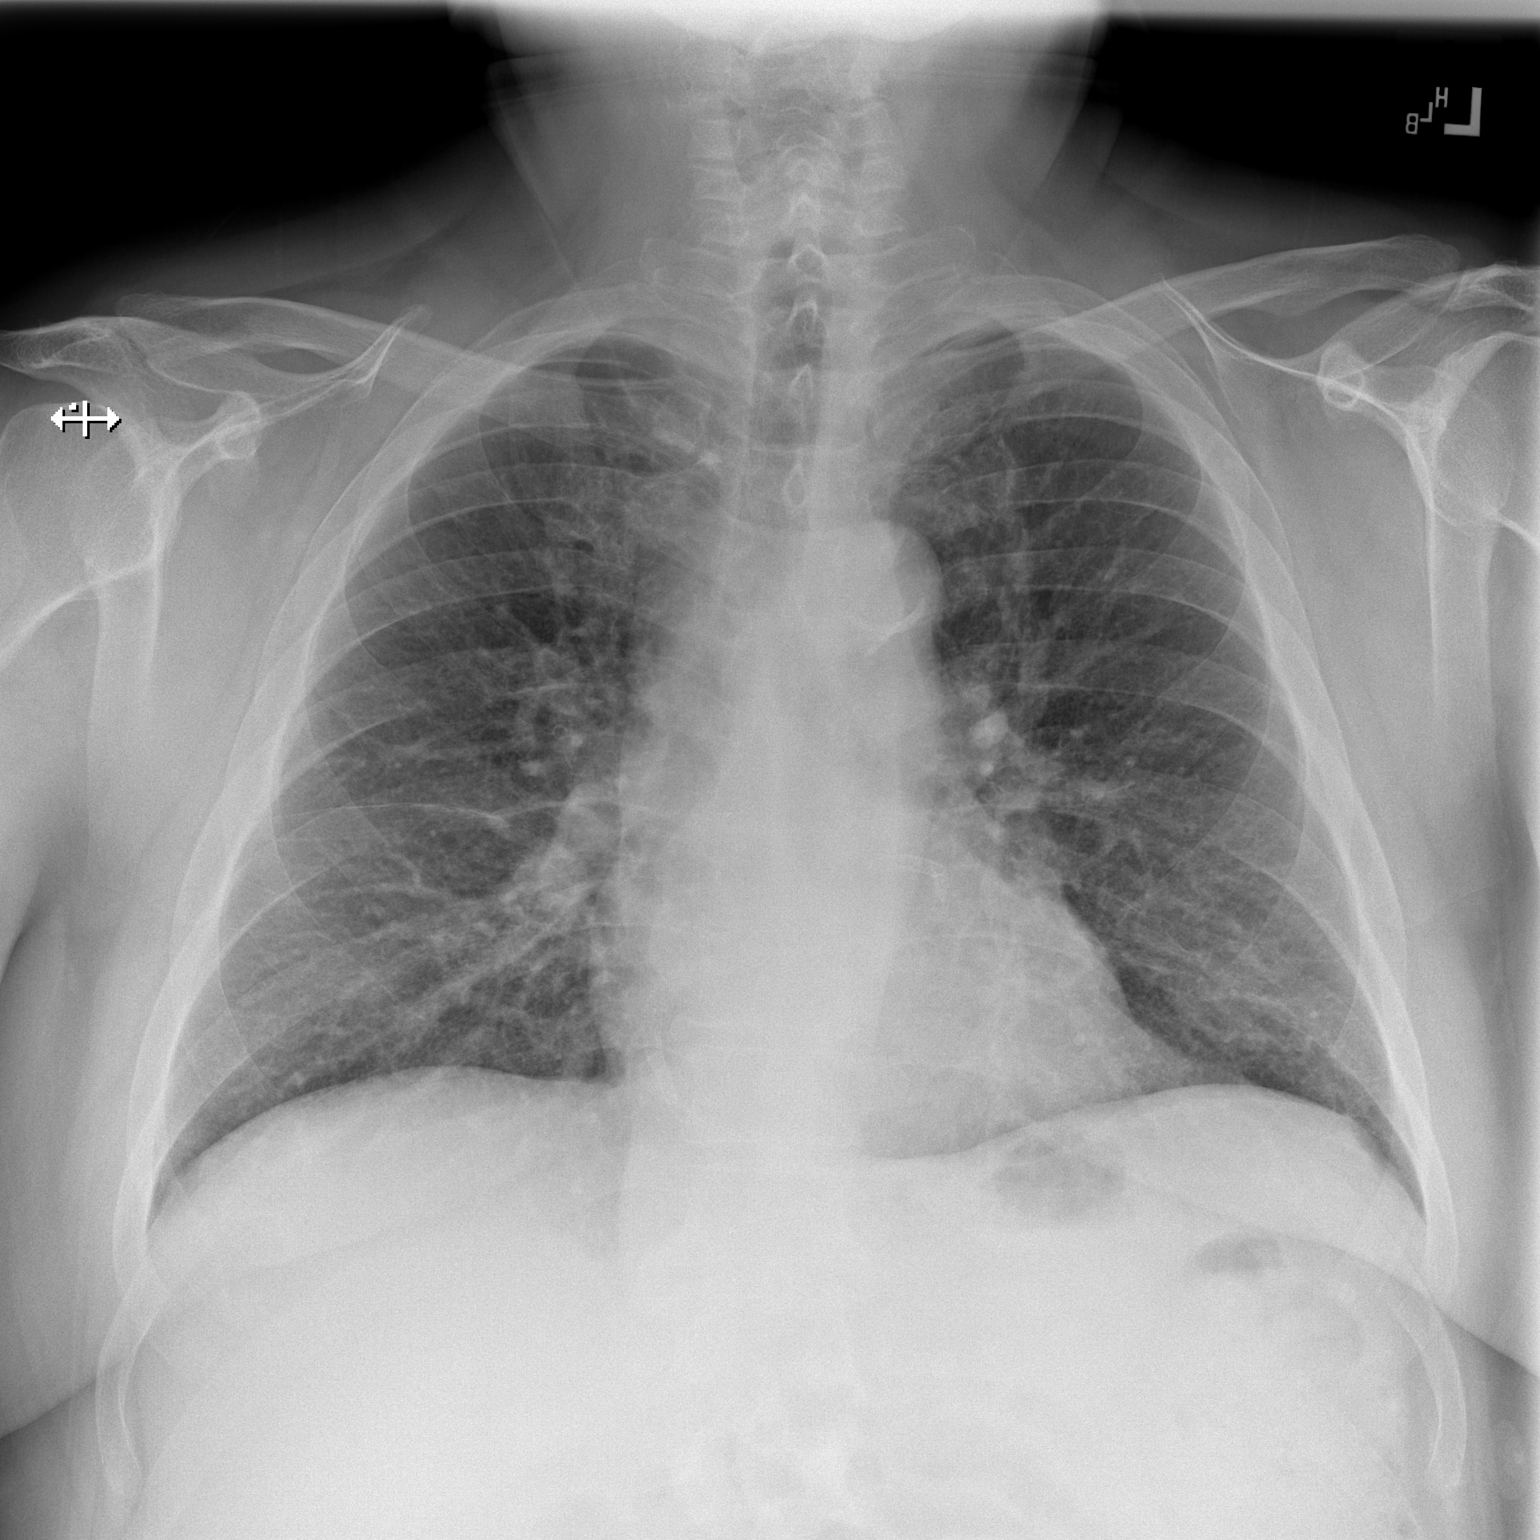

[w chest lat]
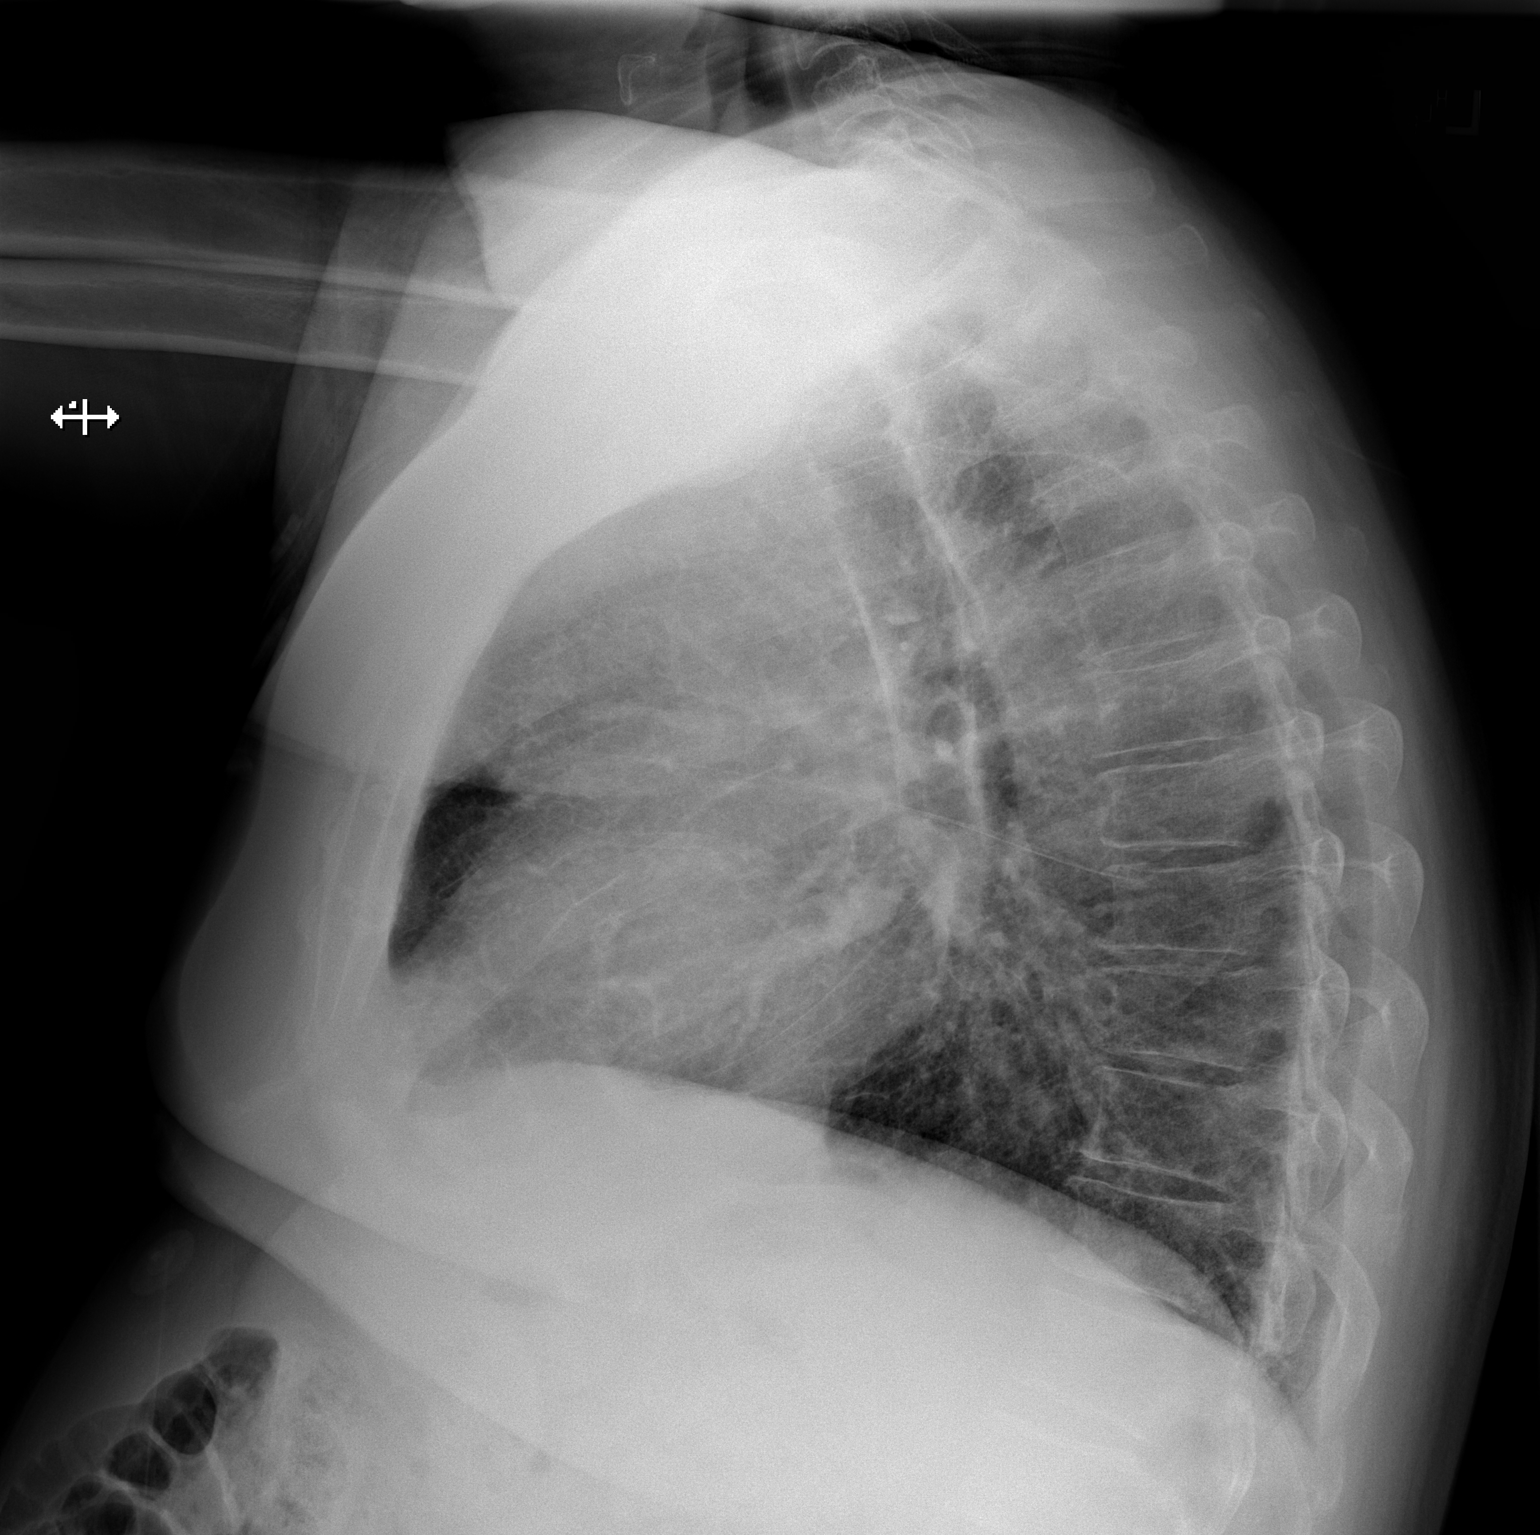

[2 of 2 positions shown; findings below may reference images not displayed]

FINDINGS: There is no edema or airspace opacity. Heart size and pulmonary
vascularity are normal. There is aortic atherosclerosis. No
adenopathy. No bone lesions. No pneumothorax.
IMPRESSION: Lungs clear.  Cardiac silhouette normal.

Aortic Atherosclerosis ([id number]).

## 2022-06-14 ENCOUNTER — Other Ambulatory Visit: Payer: Self-pay | Admitting: Cardiology

## 2022-07-01 DIAGNOSIS — I10 Essential (primary) hypertension: Secondary | ICD-10-CM | POA: Diagnosis not present

## 2022-07-01 DIAGNOSIS — E7849 Other hyperlipidemia: Secondary | ICD-10-CM | POA: Diagnosis not present

## 2022-07-01 DIAGNOSIS — E1165 Type 2 diabetes mellitus with hyperglycemia: Secondary | ICD-10-CM | POA: Diagnosis not present

## 2022-07-01 DIAGNOSIS — E1151 Type 2 diabetes mellitus with diabetic peripheral angiopathy without gangrene: Secondary | ICD-10-CM | POA: Diagnosis not present

## 2022-07-07 DIAGNOSIS — I10 Essential (primary) hypertension: Secondary | ICD-10-CM | POA: Diagnosis not present

## 2022-07-07 DIAGNOSIS — F17210 Nicotine dependence, cigarettes, uncomplicated: Secondary | ICD-10-CM | POA: Diagnosis not present

## 2022-07-07 DIAGNOSIS — I25119 Atherosclerotic heart disease of native coronary artery with unspecified angina pectoris: Secondary | ICD-10-CM | POA: Diagnosis not present

## 2022-07-11 ENCOUNTER — Other Ambulatory Visit: Payer: Self-pay | Admitting: Student

## 2022-07-23 DIAGNOSIS — Z23 Encounter for immunization: Secondary | ICD-10-CM | POA: Diagnosis not present

## 2022-09-08 NOTE — Progress Notes (Unsigned)
Cardiology Office Note  Date: 09/09/2022   ID: [name] Gray, DOB [birth year], MRN [id number]  PCP:  [facility]  Cardiologist:  Nona Dell, MD Electrophysiologist:  None   Chief Complaint  Patient presents with   Cardiac follow-up    History of Present Illness: [name] Gray is a 66 y.o. male last seen in April 2022 by Mr. [name] Hews NP, I reviewed the note.  He is a former patient of Dr. Purvis Sheffield that I am seeing in clinic for the first time today.  He is here for a routine visit, reports no angina or nitroglycerin use.  Tells me that he has had chronic trouble with sternal incisional discomfort and a "knot" at the base of the incision for quite some time.  Has had this evaluated through [facility].  He had a chest CT earlier this year which showed stable appearance post-sternotomy with no dehiscence or inflammation.  Focal soft tissue thickening noted in the subcutaneous fat anterior to the distal sternum was unchanged as well.  We went over his medications.  Discussed stopping aspirin at this point and continuing Plavix.  He has no intolerance to Crestor, requesting interval lab work from Dr. Reuel Boom.  His LDL was 39 in 2021.  I personally viewed his ECG today which shows normal sinus rhythm.  Past Medical History:  Diagnosis Date   CAD (coronary artery disease)    Multivessel status post previous stent interventions and ultimately CABG in November 2021   Essential hypertension    Hyperlipidemia    OSA (obstructive sleep apnea)    Postoperative atrial fibrillation (HCC)    Type 2 diabetes mellitus (HCC)     Past Surgical History:  Procedure Laterality Date   CORONARY ARTERY BYPASS GRAFT N/A 09/23/2020   Procedure: CORONARY ARTERY BYPASS GRAFTING (CABG), ON PUMP, TIMES THREE, USING BILATERAL INTERNAL MAMMARY ARTERIES AND LEFT RADIAL ARTERY;  Surgeon: Linden Dolin, MD;  Location: [facility] OR;  Service: Open Heart Surgery;  Laterality: N/A;   CORONARY ATHERECTOMY  N/A 04/04/2020   Procedure: CORONARY ATHERECTOMY;  Surgeon: Corky Crafts, MD;  Location: [facility] INVASIVE CV LAB;  Service: Cardiovascular;  Laterality: N/A;   CORONARY STENT INTERVENTION N/A 04/03/2020   Procedure: CORONARY STENT INTERVENTION;  Surgeon: Corky Crafts, MD;  Location: [facility] INVASIVE CV LAB;  Service: Cardiovascular;  Laterality: N/A;   INTRAVASCULAR PRESSURE WIRE/FFR STUDY N/A 09/19/2020   Procedure: INTRAVASCULAR PRESSURE WIRE/FFR STUDY;  Surgeon: Kathleene Hazel, MD;  Location: [facility] INVASIVE CV LAB;  Service: Cardiovascular;  Laterality: N/A;   INTRAVASCULAR ULTRASOUND/IVUS N/A 04/03/2020   Procedure: Intravascular Ultrasound/IVUS;  Surgeon: Corky Crafts, MD;  Location: [facility] INVASIVE CV LAB;  Service: Cardiovascular;  Laterality: N/A;   INTRAVASCULAR ULTRASOUND/IVUS N/A 04/04/2020   Procedure: Intravascular Ultrasound/IVUS;  Surgeon: Corky Crafts, MD;  Location: [facility] INVASIVE CV LAB;  Service: Cardiovascular;  Laterality: N/A;   LEFT HEART CATH AND CORONARY ANGIOGRAPHY N/A 04/03/2020   Procedure: LEFT HEART CATH AND CORONARY ANGIOGRAPHY;  Surgeon: Corky Crafts, MD;  Location: [facility] INVASIVE CV LAB;  Service: Cardiovascular;  Laterality: N/A;   LEFT HEART CATH AND CORONARY ANGIOGRAPHY N/A 04/04/2020   Procedure: LEFT HEART CATH AND CORONARY ANGIOGRAPHY;  Surgeon: Corky Crafts, MD;  Location: [facility] INVASIVE CV LAB;  Service: Cardiovascular;  Laterality: N/A;   LEFT HEART CATH AND CORONARY ANGIOGRAPHY N/A 09/19/2020   Procedure: LEFT HEART CATH AND CORONARY ANGIOGRAPHY;  Surgeon: Kathleene Hazel, MD;  Location: [facility] INVASIVE CV LAB;  Service: Cardiovascular;  Laterality: N/A;   RADIAL ARTERY HARVEST Left 09/23/2020   Procedure: RADIAL ARTERY HARVEST;  Surgeon: Wonda Olds, MD;  Location: [facility];  Service: Open Heart Surgery;  Laterality: Left;   TEE WITHOUT CARDIOVERSION N/A 09/23/2020   Procedure: TRANSESOPHAGEAL ECHOCARDIOGRAM (TEE);  Surgeon:  Wonda Olds, MD;  Location: [facility];  Service: Open Heart Surgery;  Laterality: N/A;    Current Outpatient Medications  Medication Sig Dispense Refill   acetaminophen (TYLENOL) 500 MG tablet Take 2 tablets (1,000 mg total) by mouth every 6 (six) hours as needed. 30 tablet 0   Blood Glucose Monitoring Suppl (TRUE METRIX AIR GLUCOSE METER) w/Device KIT USE AS DIRECTED 1 kit 0   cephALEXin (KEFLEX) 500 MG capsule Take 1 capsule (500 mg total) by mouth 3 (three) times daily. 33 capsule 0   clopidogrel (PLAVIX) 75 MG tablet TAKE 1 TABLET BY MOUTH EVERY DAY WITH BREAKFAST 90 tablet 3   Doxycycline Hyclate 50 MG TABS Take by mouth.     Global Inject Ease Lancets 30G MISC USE FOUR TIMES DAILY 100 each 0   insulin detemir (LEVEMIR FLEXTOUCH) 100 UNIT/ML FlexPen Inject 25 Units into the skin at bedtime. 15 mL 3   Insulin Pen Needle 32G X 4 MM MISC 1 box of 100 each 1 each 2   lisinopril-hydrochlorothiazide (PRINZIDE,ZESTORETIC) 10-12.5 MG per tablet Take 1 tablet by mouth daily.     metFORMIN (GLUCOPHAGE-XR) 500 MG 24 hr tablet Take 500 mg by mouth 2 (two) times daily.     metoprolol succinate (TOPROL-XL) 50 MG 24 hr tablet TAKE 1 TABLET BY MOUTH DAILY with OR immediately following a meal 30 tablet 2   Multiple Vitamin (MULTIVITAMIN WITH MINERALS) TABS tablet Take 1 tablet by mouth daily.     rosuvastatin (CRESTOR) 20 MG tablet TAKE 1 TABLET BY MOUTH EVERY DAY 30 tablet 2   Semaglutide,0.25 or 0.5MG /DOS, (OZEMPIC, 0.25 OR 0.5 MG/DOSE,) 2 MG/1.5ML SOPN Inject 0.5 mg into the skin once a week.      No current facility-administered medications for this visit.   Allergies:  Patient has no known allergies.   ROS: No palpitations or syncope.  Physical Exam: VS:  BP (!) 146/80   Pulse 83   Ht 5\' 9"  (1.753 m)   Wt 238 lb 9.6 oz (108.2 kg)   SpO2 97%   BMI 35.24 kg/m , BMI Body mass index is 35.24 kg/m.  Wt Readings from Last 3 Encounters:  09/09/22 238 lb 9.6 oz (108.2 kg)  12/11/21 245 lb  (111.1 kg)  06/26/21 235 lb (106.6 kg)    General: Patient appears comfortable at rest. HEENT: Conjunctiva and lids normal. Neck: Supple, no elevated JVP or carotid bruits. Lungs: Clear to auscultation, nonlabored breathing at rest. Cardiac: Regular rate and rhythm, no S3 or significant systolic murmur. Abdomen: Soft, nontender, bowel sounds present. Extremities: No pitting edema.  ECG:  An ECG dated 12/27/2018 was personally reviewed today and demonstrated:  Sinus rhythm with rightward axis and nonspecific T wave changes.  Recent Labwork:    Component Value Date/Time   CHOL 101 09/20/2020 0244   TRIG 191 (H) 09/20/2020 0244   HDL 24 (L) 09/20/2020 0244   CHOLHDL 4.2 09/20/2020 0244   VLDL 38 09/20/2020 0244   LDLCALC 39 09/20/2020 0244  November 2021: Potassium 3.8, BUN 12, creatinine 0.87, hemoglobin 11.1, platelets 112  Other Studies Reviewed Today:  Cardiac catheterization 09/19/2020: Previously placed Prox LAD drug eluting stent is widely patent.  Balloon angioplasty was performed. Prox RCA to Mid RCA lesion is 25% stenosed. Dist RCA lesion is 10% stenosed. Non-stenotic Prox RCA lesion was previously treated. Mid RCA lesion is 99% stenosed. Mid LAD lesion is 70% stenosed with 0% stenosed side branch in 2nd Diag.   1. The LAD is a large caliber vessel that courses to the apex. The proximal and mid vessel has a stented segment. There is a severe restenosis in the mid LAD stented segment in the stent that was placed in may 2021. (DFR=0.85).  2. The Circumflex is small with diffuse plaque 3. The RCA is a large dominant artery with stents from the ostium down through the distal vessel. There is severe restenosis in the mid stented segment. This area has 2 layers of stent and was treated in May 2021 with cutting balloon angioplasty.    Recommendations: He has progression of disease in the stented segments of the LAD and RCA. The LAD stent was placed in may 2021. The mid RCA stented  segment has 2 layers of stent and was treated with cutting balloon angioplasty in May 2021. The LAD restenosis is significant by pressure wire analysis. I have reviewed his case with Dr. [place] and we both agree that his best option will be CABG.   Echocardiogram 09/20/2020:  1. Left ventricular ejection fraction, by estimation, is 50 to 55%. The  left ventricle has low normal function. Difficult to assess wall motion  due to incomplete visualization of LV myocardium. Based on limited views,  all LV segments appear to have low  normal contractility. There is mild concentric left ventricular  hypertrophy. Left ventricular diastolic parameters are consistent with  Grade I diastolic dysfunction (impaired relaxation).   2. Right ventricular systolic function is mildly reduced. The right  ventricular size is normal.   3. The mitral valve is normal in structure. Trivial mitral valve  regurgitation.   4. The inferior vena cava is normal in size with greater than 50%  respiratory variability, suggesting right atrial pressure of 3 mmHg.   5. The aortic valve is grossly normal. There is mild thickening of the  aortic valve. Aortic valve regurgitation is not visualized.   Chest CT 12/11/2021: FINDINGS: Cardiovascular: Diffuse coronary artery atherosclerosis status post median sternotomy and CABG. There is lesser atherosclerosis of the aorta and great vessels. No acute vascular findings on noncontrast imaging. The heart size is normal. There is no pericardial effusion.   Mediastinum/Nodes: Multiple prominent to mildly enlarged mediastinal lymph nodes are similar to the previous CT, largest measuring 1.6 cm in the precarinal station (image 52/2). No progressive adenopathy identified. There are no enlarged axillary lymph nodes. Hilar assessment is limited by the lack of intravenous contrast, although the hilar contours appear unchanged. The thyroid gland, trachea and esophagus demonstrate no  significant findings.   Lungs/Pleura: No pleural effusion or pneumothorax. Paraseptal emphysema with patchy ground-glass opacities throughout the lungs, similar to previous study and likely due to a combination of atelectasis and chronic interstitial lung disease. No confluent airspace opacity or suspicious pulmonary nodule.   Upper abdomen: Calcified gallstones. No evidence of cholecystitis. The visualized upper abdomen otherwise appears unremarkable.   Musculoskeletal/Chest wall: There is no chest wall mass or suspicious osseous finding. The median sternotomy wires are intact. The median sternotomy appears unchanged with areas of chronic central lucency superiorly in the sternal body, but no dehiscence or bone destruction. There are no parasternal fluid collections or inflammatory changes. A small area of focal soft tissue  thickening in the subcutaneous fat anterior to the distal sternum is unchanged. There is a stable small sub xiphoid hernia containing only fat.   IMPRESSION: 1. Stable postoperative appearance of the sternum status post median sternotomy and CABG. The sternotomy wires are intact, and there is no sternal dehiscence or surrounding inflammation. 2. Stable chronic patchy ground-glass opacities in both lungs with scattered paraseptal emphysema. 3. Cholelithiasis. 4. Coronary and Aortic Atherosclerosis (ICD10-I70.0).  Assessment and Plan:  1.  Multivessel CAD status post CABG in November 2021.  LVEF 50 to 55% by evaluation at that time.  He reports no active angina or nitroglycerin use, NYHA class II dyspnea.  Plan to stop aspirin.  Continue Plavix, Toprol-XL, Prinzide, Crestor, and Ozempic.  He has as needed nitroglycerin available.  Requesting interval lab work from Dr. Quillian Quince.  2.  History of postoperative atrial fibrillation without recurrence.  ECG is normal today.  3.  Essential hypertension, blood pressure elevated today, patient reports compliance with  therapy.  Keep follow-up with Dr. Quillian Quince.  Medication Adjustments/Labs and Tests Ordered: Current medicines are reviewed at length with the patient today.  Concerns regarding medicines are outlined above.   Tests Ordered: Orders Placed This Encounter  Procedures   EKG 12-Lead    Medication Changes: No orders of the defined types were placed in this encounter.   Disposition:  Follow up  1 year.  Signed, Satira Sark, MD, [facility] 09/09/2022 10:41 AM    [facility]. [address], [place], [place] [address] Phone: [phone]; Fax: [phone]

## 2022-09-09 ENCOUNTER — Encounter: Payer: Self-pay | Admitting: Cardiology

## 2022-09-09 ENCOUNTER — Ambulatory Visit: Payer: BC Managed Care – PPO | Attending: Cardiology | Admitting: Cardiology

## 2022-09-09 VITALS — BP 146/80 | HR 83 | Ht 69.0 in | Wt 238.6 lb

## 2022-09-09 DIAGNOSIS — E782 Mixed hyperlipidemia: Secondary | ICD-10-CM | POA: Diagnosis not present

## 2022-09-09 DIAGNOSIS — I10 Essential (primary) hypertension: Secondary | ICD-10-CM

## 2022-09-09 DIAGNOSIS — I25119 Atherosclerotic heart disease of native coronary artery with unspecified angina pectoris: Secondary | ICD-10-CM

## 2022-09-09 NOTE — Patient Instructions (Addendum)
Medication Instructions:   STOP Aspirin  Labwork: None  today  Testing/Procedures: None today  Follow-Up: 1 year in [place]  Any Other Special Instructions Will Be Listed Below (If Applicable).  If you need a refill on your cardiac medications before your next appointment, please call your pharmacy.

## 2022-09-16 ENCOUNTER — Other Ambulatory Visit: Payer: Self-pay | Admitting: Student

## 2022-11-04 DIAGNOSIS — E7801 Familial hypercholesterolemia: Secondary | ICD-10-CM | POA: Diagnosis not present

## 2022-11-04 DIAGNOSIS — E782 Mixed hyperlipidemia: Secondary | ICD-10-CM | POA: Diagnosis not present

## 2022-11-04 DIAGNOSIS — E7800 Pure hypercholesterolemia, unspecified: Secondary | ICD-10-CM | POA: Diagnosis not present

## 2022-11-04 DIAGNOSIS — E1165 Type 2 diabetes mellitus with hyperglycemia: Secondary | ICD-10-CM | POA: Diagnosis not present

## 2022-11-04 DIAGNOSIS — E1151 Type 2 diabetes mellitus with diabetic peripheral angiopathy without gangrene: Secondary | ICD-10-CM | POA: Diagnosis not present

## 2022-11-04 DIAGNOSIS — E7849 Other hyperlipidemia: Secondary | ICD-10-CM | POA: Diagnosis not present

## 2022-11-04 DIAGNOSIS — Z1329 Encounter for screening for other suspected endocrine disorder: Secondary | ICD-10-CM | POA: Diagnosis not present

## 2022-11-09 DIAGNOSIS — I25119 Atherosclerotic heart disease of native coronary artery with unspecified angina pectoris: Secondary | ICD-10-CM | POA: Diagnosis not present

## 2022-11-09 DIAGNOSIS — Z23 Encounter for immunization: Secondary | ICD-10-CM | POA: Diagnosis not present

## 2022-11-09 DIAGNOSIS — I10 Essential (primary) hypertension: Secondary | ICD-10-CM | POA: Diagnosis not present

## 2022-11-09 DIAGNOSIS — F17210 Nicotine dependence, cigarettes, uncomplicated: Secondary | ICD-10-CM | POA: Diagnosis not present

## 2023-02-21 IMAGING — CR DG CHEST 2V
2 series · 2 of 2 positions shown · non-contrast
Comparison: 10/06/2020

CLINICAL DATA: Sternal wound check common status post coronary
bypass September 2019

EXAM:
CHEST - 2 VIEW

[w chest pa]
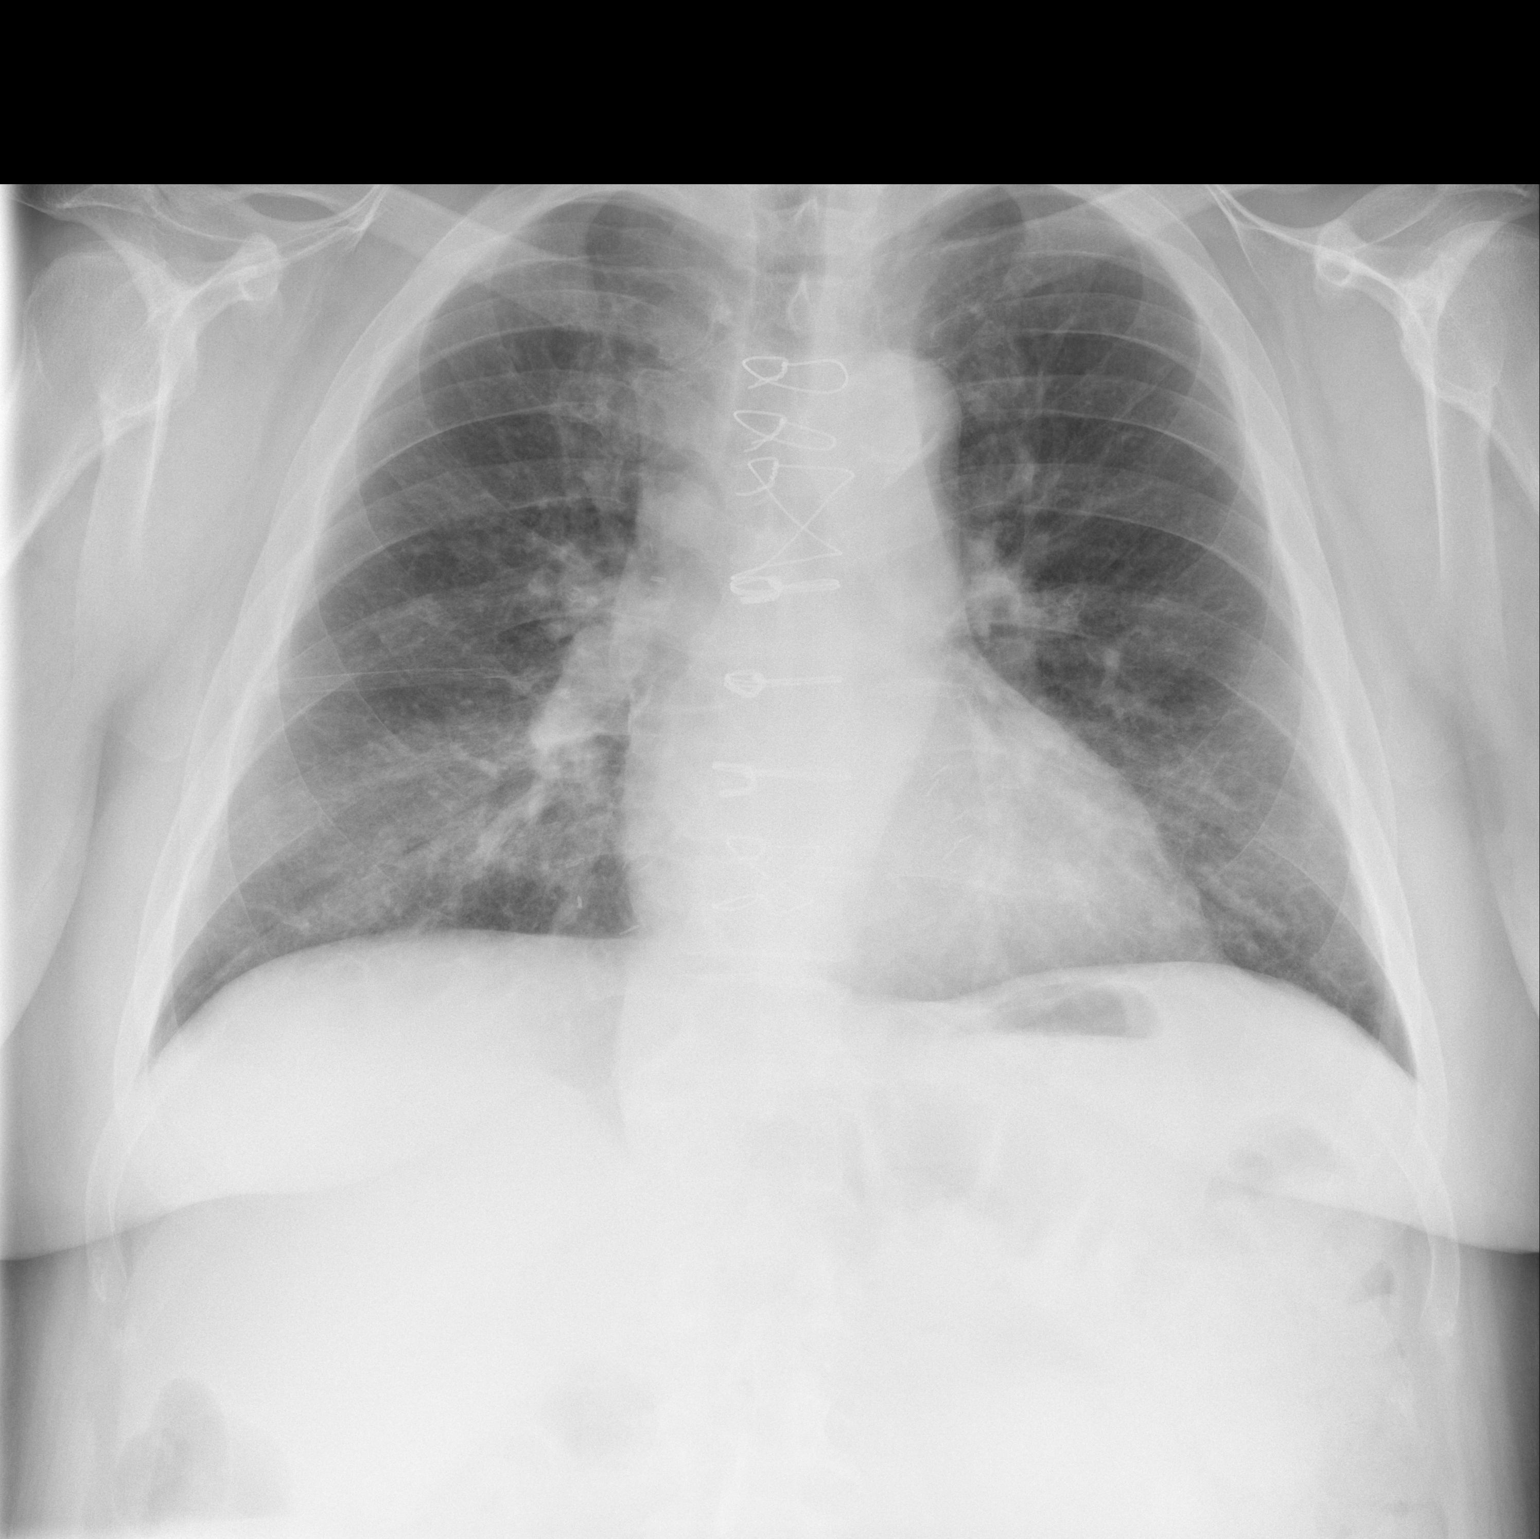

[w chest lat]
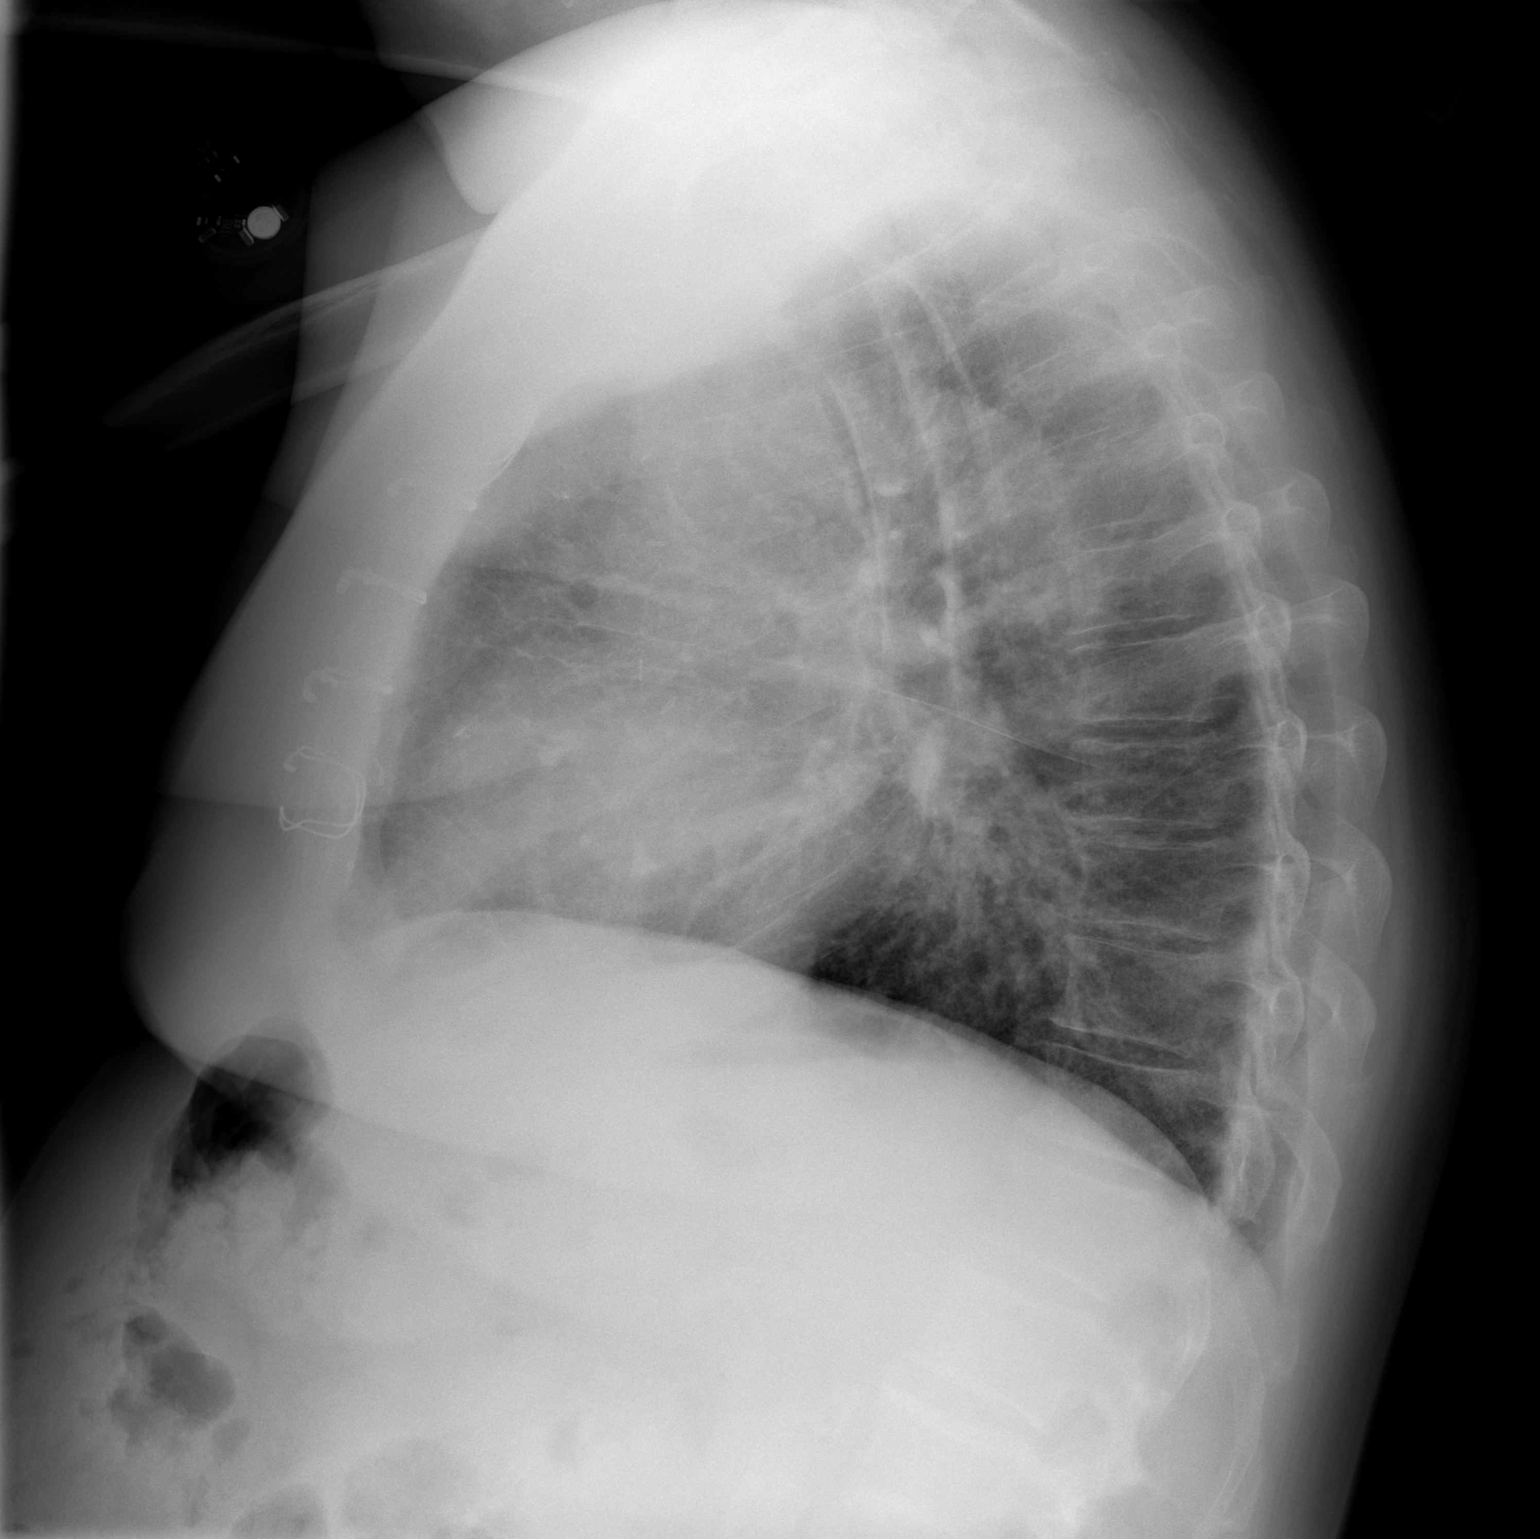

[2 of 2 positions shown; findings below may reference images not displayed]

FINDINGS: Stable intact appearing sternotomy wires by plain radiography.
Negative for edema or focal pneumonia. No effusion or pneumothorax.
Trachea midline. Aorta atherosclerotic. Nonobstructive bowel gas
pattern.
IMPRESSION: Stable postoperative findings.  No acute chest process.

Aortic Atherosclerosis ([id number]).

## 2023-03-12 ENCOUNTER — Other Ambulatory Visit: Payer: Self-pay | Admitting: Physician Assistant

## 2023-03-12 ENCOUNTER — Other Ambulatory Visit: Payer: Self-pay | Admitting: Cardiology

## 2023-09-02 ENCOUNTER — Other Ambulatory Visit: Payer: Self-pay | Admitting: Cardiology

## 2024-03-01 ENCOUNTER — Other Ambulatory Visit: Payer: Self-pay | Admitting: Physician Assistant

## 2024-03-01 ENCOUNTER — Other Ambulatory Visit: Payer: Self-pay | Admitting: Cardiology

## 2024-03-21 ENCOUNTER — Other Ambulatory Visit: Payer: Self-pay | Admitting: Cardiology

## 2024-03-29 ENCOUNTER — Other Ambulatory Visit: Payer: Self-pay | Admitting: Cardiology

## 2024-04-24 ENCOUNTER — Other Ambulatory Visit: Payer: Self-pay | Admitting: Cardiology

## 2024-05-14 ENCOUNTER — Other Ambulatory Visit: Payer: Self-pay | Admitting: Cardiology

## 2024-06-01 ENCOUNTER — Ambulatory Visit: Attending: Nurse Practitioner | Admitting: Nurse Practitioner

## 2024-06-01 ENCOUNTER — Encounter: Payer: Self-pay | Admitting: Nurse Practitioner

## 2024-06-01 VITALS — BP 158/82 | HR 82 | Ht 69.0 in | Wt 224.6 lb

## 2024-06-01 DIAGNOSIS — I2510 Atherosclerotic heart disease of native coronary artery without angina pectoris: Secondary | ICD-10-CM | POA: Insufficient documentation

## 2024-06-01 DIAGNOSIS — E785 Hyperlipidemia, unspecified: Secondary | ICD-10-CM | POA: Diagnosis present

## 2024-06-01 DIAGNOSIS — Z720 Tobacco use: Secondary | ICD-10-CM | POA: Insufficient documentation

## 2024-06-01 DIAGNOSIS — I10 Essential (primary) hypertension: Secondary | ICD-10-CM | POA: Diagnosis not present

## 2024-06-01 NOTE — Progress Notes (Unsigned)
 Cardiology Office Note   Date:  06/01/2024 ID:  KENDRIC SINDELAR, DOB [birth year], MRN [id number] PCP: [facility]  [facility] Providers Cardiologist:  Jayson Sierras, MD     History of Present Illness [name] Gray is a 68 y.o. male with a PMH of CAD, s/p CABG in 2021, HTN, HLD, T2DM, and OSA, who presents today for overdue 1 year follow-up.   Last seen by Dr. Sierras on September 09, 2022. Was overall doing well at the time. Was noted to have focal soft tissue thickening noted in subcutaneous fat anterior to distal of the sternum that was unchanged. Evaluated by [facility] in the past for sternal incisional discomfort.   He presents today for overdue 1 year follow-up. He states he is doing well. Denies any acute cardiac complaints or issues. BP is elevated today, but states his BP is well controlled at home. Denies any chest pain, shortness of breath, palpitations, syncope, presyncope, dizziness, orthopnea, PND, swelling or significant weight changes, acute bleeding, or claudication.  ROS: Negative. See HPI.  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 Does smoke 1/2 PPD, does not want to quit. Daughter is a Charity fundraiser and works at a SNF in [place], [place].   Studies Reviewed  EKG:  EKG Interpretation Date/Time:  Friday June 01 2024 14:55:51 EDT Ventricular Rate:  82 PR Interval:  232 QRS Duration:  108 QT Interval:  376 QTC Calculation: 439 R Axis:   72  Text Interpretation: Sinus rhythm with 1st degree A-V block Nonspecific T wave abnormality When compared with ECG of 24-Sep-2020 06:30, Confirmed by Miriam Norris [phone]) on 06/01/2024 2:58:55 PM   TEE 09/2020:  Complications: No known complications during this procedure.  POST-OP IMPRESSIONS  Overall, there were no significant changes from pre-bypass.   PRE-OP FINDINGS   Left Ventricle: The left ventricle has low normal systolic function, with  an ejection fraction of 50-55%. The cavity size was normal. There is   mildly increased left ventricular wall thickness.   Right Ventricle: The right ventricle has normal systolic function. The  cavity was normal. There is no increase in right ventricular wall  thickness.   Left Atrium: Left atrial size was normal in size. The left atrial  appendage is well visualized and there is no evidence of thrombus present.   Right Atrium: Right atrial size was normal in size.   Interatrial Septum: No atrial level shunt detected by color flow Doppler.   Pericardium: There is no evidence of pericardial effusion.   Mitral Valve: The mitral valve is normal in structure. Mitral valve  regurgitation is not visualized by color flow Doppler. There is No  evidence of mitral stenosis.   Tricuspid Valve: The tricuspid valve was normal in structure. Tricuspid  valve regurgitation is trivial by color flow Doppler.   Aortic Valve: The aortic valve is tricuspid Aortic valve regurgitation was  not visualized by color flow Doppler. There is no stenosis of the aortic  valve.   Pulmonic Valve: The pulmonic valve was normal in structure.  Pulmonic valve regurgitation is not visualized by color flow Doppler.    Aorta: The aortic root are normal in size and structure.  Vascular US  Doppler Pre-CABG 08/2020: Summary:  Right Carotid: Velocities in the right ICA are consistent with a 40-59%                 stenosis.   Left Carotid: Velocities in the left ICA are consistent with a 1-39%  stenosis.  Vertebrals: Bilateral vertebral arteries demonstrate antegrade flow.  Subclavians: Normal flow hemodynamics were seen in bilateral subclavian               arteries.   Right ABI: Resting right ankle-brachial index indicates moderate right  lower extremity arterial disease. The right toe-brachial index is  abnormal.  Left ABI: Resting left ankle-brachial index indicates moderate left lower  extremity arterial disease. The left toe-brachial index is abnormal.  Right Upper  Extremity: Doppler waveforms remain within normal limits with  right radial compression. Doppler waveforms remain within normal limits  with right ulnar compression.  Left Upper Extremity: Doppler waveforms remain within normal limits with  left radial compression. Doppler waveform obliterate with left ulnar  compression.    Echo 08/2020:   1. Left ventricular ejection fraction, by estimation, is 50 to 55%. The  left ventricle has low normal function. Difficult to assess wall motion  due to incomplete visualization of LV myocardium. Based on limited views,  all LV segments appear to have low  normal contractility. There is mild concentric left ventricular  hypertrophy. Left ventricular diastolic parameters are consistent with  Grade I diastolic dysfunction (impaired relaxation).   2. Right ventricular systolic function is mildly reduced. The right  ventricular size is normal.   3. The mitral valve is normal in structure. Trivial mitral valve  regurgitation.   4. The inferior vena cava is normal in size with greater than 50%  respiratory variability, suggesting right atrial pressure of 3 mmHg.   5. The aortic valve is grossly normal. There is mild thickening of the  aortic valve. Aortic valve regurgitation is not visualized.   Comparison(s): No prior Echocardiogram.  LHC 08/2020:  Previously placed Prox LAD drug eluting stent is widely patent. Balloon angioplasty was performed. Prox RCA to Mid RCA lesion is 25% stenosed. Dist RCA lesion is 10% stenosed. Non-stenotic Prox RCA lesion was previously treated. Mid RCA lesion is 99% stenosed. Mid LAD lesion is 70% stenosed with 0% stenosed side branch in 2nd Diag.   1. The LAD is a large caliber vessel that courses to the apex. The proximal and mid vessel has a stented segment. There is a severe restenosis in the mid LAD stented segment in the stent that was placed in may 2021. (DFR=0.85).  2. The Circumflex is small with diffuse  plaque 3. The RCA is a large dominant artery with stents from the ostium down through the distal vessel. There is severe restenosis in the mid stented segment. This area has 2 layers of stent and was treated in May 2021 with cutting balloon angioplasty.    Recommendations: He has progression of disease in the stented segments of the LAD and RCA. The LAD stent was placed in may 2021. The mid RCA stented segment has 2 layers of stent and was treated with cutting balloon angioplasty in May 2021. The LAD restenosis is significant by pressure wire analysis. I have reviewed his case with Dr. [place] and we both agree that his best option will be CABG.  He will be admitted to telemetry. He is pain free. Will arrange an echo. Will call CT surgery consult for CABG. Will stop Plavix . Continue ASA. Begin IV heparin  8 hours post sheath pull.       Physical Exam VS:  BP (!) 158/82 (BP Location: Left Arm)   Pulse 82   Ht 5' 9 (1.753 m)   Wt 224 lb 9.6 oz (101.9 kg)   SpO2 94%  BMI 33.17 kg/m        Wt Readings from Last 3 Encounters:  06/01/24 224 lb 9.6 oz (101.9 kg)  09/09/22 238 lb 9.6 oz (108.2 kg)  12/11/21 245 lb (111.1 kg)    GEN: Well nourished, well developed in no acute distress NECK: No JVD; No carotid bruits CARDIAC: S1/S2, RRR, no murmurs, rubs, gallops RESPIRATORY:  Clear to auscultation with scattered diminished expiratory wheezing, no rales or rhonchi  ABDOMEN: Soft, non-tender, non-distended EXTREMITIES:  No edema; No deformity   ASSESSMENT AND PLAN  CAD, s/p CABG Stable with no anginal symptoms. No indication for ischemic evaluation. No medication changes at this time. Will request most recent labs from his PCP. Heart healthy diet and regular cardiovascular exercise encouraged.   HLD Will request most recent labs. Continue Crestor . Heart healthy diet and regular cardiovascular exercise encouraged.   HTN BP elevated today, normally home readings are good. Discussed to  monitor BP at home at least 2 hours after medications and sitting for 5-10 minutes. Heart healthy diet and regular cardiovascular exercise encouraged. No medication changes at this time. He will notify our office if SBP is consistently greater than 140.   Tobacco abuse Smoking cessation encouraged and discussed.    Dispo: Follow-up with MD/APP in 1 year or sooner if anything changes.   Signed, Almarie Crate, NP

## 2024-06-01 NOTE — Patient Instructions (Addendum)

## 2024-07-09 ENCOUNTER — Other Ambulatory Visit: Payer: Self-pay | Admitting: Cardiology

## 2024-08-08 ENCOUNTER — Other Ambulatory Visit: Payer: Self-pay | Admitting: Cardiology
